# Patient Record
Sex: Female | Born: 1970 | Race: White | Hispanic: No | Marital: Married | Smoking: Former smoker
Health system: Southern US, Community
[De-identification: ages and names within clinical notes are randomized; demographics above are authoritative.]

## PROBLEM LIST (undated history)

## (undated) DIAGNOSIS — A499 Bacterial infection, unspecified: Secondary | ICD-10-CM

## (undated) DIAGNOSIS — I519 Heart disease, unspecified: Secondary | ICD-10-CM

## (undated) DIAGNOSIS — F17200 Nicotine dependence, unspecified, uncomplicated: Secondary | ICD-10-CM

## (undated) DIAGNOSIS — E785 Hyperlipidemia, unspecified: Secondary | ICD-10-CM

## (undated) DIAGNOSIS — F419 Anxiety disorder, unspecified: Secondary | ICD-10-CM

## (undated) DIAGNOSIS — R5383 Other fatigue: Secondary | ICD-10-CM

## (undated) DIAGNOSIS — K649 Unspecified hemorrhoids: Secondary | ICD-10-CM

## (undated) DIAGNOSIS — K219 Gastro-esophageal reflux disease without esophagitis: Secondary | ICD-10-CM

## (undated) DIAGNOSIS — F32A Depression, unspecified: Secondary | ICD-10-CM

## (undated) DIAGNOSIS — K921 Melena: Secondary | ICD-10-CM

## (undated) DIAGNOSIS — M546 Pain in thoracic spine: Secondary | ICD-10-CM

## (undated) DIAGNOSIS — N809 Endometriosis, unspecified: Secondary | ICD-10-CM

## (undated) DIAGNOSIS — K4090 Unilateral inguinal hernia, without obstruction or gangrene, not specified as recurrent: Secondary | ICD-10-CM

## (undated) DIAGNOSIS — R87619 Unspecified abnormal cytological findings in specimens from cervix uteri: Secondary | ICD-10-CM

## (undated) DIAGNOSIS — B009 Herpesviral infection, unspecified: Secondary | ICD-10-CM

## (undated) DIAGNOSIS — R42 Dizziness and giddiness: Secondary | ICD-10-CM

## (undated) DIAGNOSIS — N938 Other specified abnormal uterine and vaginal bleeding: Secondary | ICD-10-CM

## (undated) DIAGNOSIS — R002 Palpitations: Secondary | ICD-10-CM

## (undated) DIAGNOSIS — H9312 Tinnitus, left ear: Secondary | ICD-10-CM

## (undated) DIAGNOSIS — I219 Acute myocardial infarction, unspecified: Secondary | ICD-10-CM

## (undated) DIAGNOSIS — R252 Cramp and spasm: Secondary | ICD-10-CM

## (undated) DIAGNOSIS — D219 Benign neoplasm of connective and other soft tissue, unspecified: Secondary | ICD-10-CM

## (undated) DIAGNOSIS — Z86010 Personal history of colonic polyps: Secondary | ICD-10-CM

## (undated) DIAGNOSIS — I10 Essential (primary) hypertension: Secondary | ICD-10-CM

## (undated) DIAGNOSIS — Z87898 Personal history of other specified conditions: Secondary | ICD-10-CM

## (undated) DIAGNOSIS — L309 Dermatitis, unspecified: Secondary | ICD-10-CM

## (undated) DIAGNOSIS — N979 Female infertility, unspecified: Secondary | ICD-10-CM

## (undated) DIAGNOSIS — R0600 Dyspnea, unspecified: Secondary | ICD-10-CM

## (undated) DIAGNOSIS — R1013 Epigastric pain: Secondary | ICD-10-CM

## (undated) DIAGNOSIS — Z860101 Personal history of adenomatous and serrated colon polyps: Secondary | ICD-10-CM

## (undated) DIAGNOSIS — F329 Major depressive disorder, single episode, unspecified: Secondary | ICD-10-CM

## (undated) DIAGNOSIS — G2581 Restless legs syndrome: Secondary | ICD-10-CM

## (undated) DIAGNOSIS — E669 Obesity, unspecified: Secondary | ICD-10-CM

## (undated) DIAGNOSIS — D509 Iron deficiency anemia, unspecified: Secondary | ICD-10-CM

## (undated) HISTORY — DX: Benign neoplasm of connective and other soft tissue, unspecified: D21.9

## (undated) HISTORY — DX: Acute myocardial infarction, unspecified: I21.9

## (undated) HISTORY — DX: Other specified abnormal uterine and vaginal bleeding: N93.8

## (undated) HISTORY — PX: HERNIA REPAIR: SHX51

## (undated) HISTORY — DX: Obesity, unspecified: E66.9

## (undated) HISTORY — DX: Unspecified abnormal cytological findings in specimens from cervix uteri: R87.619

## (undated) HISTORY — DX: Depression, unspecified: F32.A

## (undated) HISTORY — PX: HEMORRHOID BANDING: SHX5850

## (undated) HISTORY — DX: Major depressive disorder, single episode, unspecified: F32.9

## (undated) HISTORY — PX: COLPOSCOPY: SHX161

## (undated) HISTORY — DX: Essential (primary) hypertension: I10

## (undated) HISTORY — DX: Bacterial infection, unspecified: A49.9

## (undated) HISTORY — PX: CERVICAL BIOPSY  W/ LOOP ELECTRODE EXCISION: SUR135

## (undated) HISTORY — PX: WISDOM TOOTH EXTRACTION: SHX21

## (undated) HISTORY — DX: Hyperlipidemia, unspecified: E78.5

## (undated) HISTORY — DX: Endometriosis, unspecified: N80.9

## (undated) HISTORY — PX: OTHER SURGICAL HISTORY: SHX169

## (undated) HISTORY — PX: GYNECOLOGIC CRYOSURGERY: SHX857

## (undated) HISTORY — DX: Palpitations: R00.2

## (undated) HISTORY — DX: Anxiety disorder, unspecified: F41.9

## (undated) HISTORY — PX: TUBAL LIGATION: SHX77

## (undated) HISTORY — DX: Dizziness and giddiness: R42

## (undated) HISTORY — DX: Female infertility, unspecified: N97.9

---

## 1991-10-27 HISTORY — PX: DILATION AND CURETTAGE OF UTERUS: SHX78

## 1993-10-26 HISTORY — PX: ABLATION ON ENDOMETRIOSIS: SHX5787

## 1998-02-01 ENCOUNTER — Inpatient Hospital Stay (HOSPITAL_COMMUNITY): Admission: AD | Admit: 1998-02-01 | Discharge: 1998-02-03 | Payer: Self-pay | Admitting: *Deleted

## 1998-02-15 ENCOUNTER — Encounter: Admission: RE | Admit: 1998-02-15 | Discharge: 1998-05-16 | Payer: Self-pay | Admitting: *Deleted

## 1998-10-26 HISTORY — PX: INTRAUTERINE DEVICE INSERTION: SHX323

## 1999-03-06 ENCOUNTER — Other Ambulatory Visit: Admission: RE | Admit: 1999-03-06 | Discharge: 1999-03-06 | Payer: Self-pay | Admitting: *Deleted

## 2000-04-02 ENCOUNTER — Other Ambulatory Visit: Admission: RE | Admit: 2000-04-02 | Discharge: 2000-04-02 | Payer: Self-pay | Admitting: *Deleted

## 2001-07-08 ENCOUNTER — Other Ambulatory Visit: Admission: RE | Admit: 2001-07-08 | Discharge: 2001-07-08 | Payer: Self-pay | Admitting: *Deleted

## 2001-12-15 ENCOUNTER — Other Ambulatory Visit: Admission: RE | Admit: 2001-12-15 | Discharge: 2001-12-15 | Payer: Self-pay | Admitting: *Deleted

## 2002-06-07 ENCOUNTER — Other Ambulatory Visit: Admission: RE | Admit: 2002-06-07 | Discharge: 2002-06-07 | Payer: Self-pay | Admitting: *Deleted

## 2003-07-17 ENCOUNTER — Other Ambulatory Visit: Admission: RE | Admit: 2003-07-17 | Discharge: 2003-07-17 | Payer: Self-pay | Admitting: *Deleted

## 2004-08-14 ENCOUNTER — Other Ambulatory Visit: Admission: RE | Admit: 2004-08-14 | Discharge: 2004-08-14 | Payer: Self-pay | Admitting: Gynecology

## 2005-08-24 ENCOUNTER — Other Ambulatory Visit: Admission: RE | Admit: 2005-08-24 | Discharge: 2005-08-24 | Payer: Self-pay | Admitting: Gynecology

## 2007-03-23 ENCOUNTER — Other Ambulatory Visit: Admission: RE | Admit: 2007-03-23 | Discharge: 2007-03-23 | Payer: Self-pay | Admitting: Gynecology

## 2008-03-26 ENCOUNTER — Other Ambulatory Visit: Admission: RE | Admit: 2008-03-26 | Discharge: 2008-03-26 | Payer: Self-pay | Admitting: Gynecology

## 2008-10-26 HISTORY — PX: IUD REMOVAL: SHX5392

## 2009-01-30 ENCOUNTER — Encounter: Admission: RE | Admit: 2009-01-30 | Discharge: 2009-01-30 | Payer: Self-pay | Admitting: Internal Medicine

## 2009-02-01 ENCOUNTER — Encounter: Admission: RE | Admit: 2009-02-01 | Discharge: 2009-02-01 | Payer: Self-pay | Admitting: Internal Medicine

## 2009-07-26 ENCOUNTER — Ambulatory Visit (HOSPITAL_COMMUNITY): Admission: RE | Admit: 2009-07-26 | Discharge: 2009-07-26 | Payer: Self-pay | Admitting: Obstetrics and Gynecology

## 2010-02-18 ENCOUNTER — Encounter: Admission: RE | Admit: 2010-02-18 | Discharge: 2010-02-18 | Payer: Self-pay | Admitting: Gynecology

## 2010-07-02 ENCOUNTER — Encounter: Admission: RE | Admit: 2010-07-02 | Discharge: 2010-07-02 | Payer: Self-pay | Admitting: Internal Medicine

## 2010-11-16 ENCOUNTER — Encounter: Payer: Self-pay | Admitting: Internal Medicine

## 2011-01-30 LAB — CBC
HCT: 41.7 % (ref 36.0–46.0)
Hemoglobin: 14.3 g/dL (ref 12.0–15.0)
MCHC: 34.2 g/dL (ref 30.0–36.0)
MCV: 92.5 fL (ref 78.0–100.0)
Platelets: 268 10*3/uL (ref 150–400)
RBC: 4.51 MIL/uL (ref 3.87–5.11)
RDW: 13.4 % (ref 11.5–15.5)
WBC: 6.4 10*3/uL (ref 4.0–10.5)

## 2011-01-30 LAB — HCG, SERUM, QUALITATIVE: Preg, Serum: NEGATIVE

## 2011-10-27 HISTORY — PX: INTRAUTERINE DEVICE (IUD) INSERTION: SHX5877

## 2013-09-12 ENCOUNTER — Telehealth: Payer: Self-pay | Admitting: Obstetrics and Gynecology

## 2013-09-12 NOTE — Telephone Encounter (Signed)
Patient may make an appointment as a new patient when I have an appropriate opening.  She can be seen and examined for a "problem visit - new patient."  This does not have to be at the time of an annual exam.  I will not be able to render an opinion for the patient without a formal visit and complete review of her records at that time.  Thanks.

## 2013-09-12 NOTE — Telephone Encounter (Signed)
Patient will be a new patient wants appt with silva. I told her she was booked until January. She was ok with that. She has had a lot of abnormal paps has to go every 3 months for a pap. She really just wants a second opinion about what is going on and really doesn't want to go back to current doctor. She already had her full annual this year. But says she wants to do a pap when she comes in to get the second opinion. i told her to have her current doc fax her records so silva would have them for her appt.

## 2013-09-12 NOTE — Telephone Encounter (Signed)
Spoke with pt about history of abnormal Paps going back about 7 years. Pt has history of 2 colposcopies and a LEEP-dated 06-14-12 for precancerous cells. Pt is weary of Q 3 month Paps and is wondering if there is anything she can do, even having surgery to remove the cervix, to end this cycle. Pt burdened financially with all of the procedures. Last Aex 11-30-12 with Dr. Margaretha Sheffield. Pt last seen in August for last Pap. Discussed scheduling Aex appt here with BS 12-01-13 to assure full insurance coverage. Advised pt I would route message to [facility] for possibility of consult visit beforehand to get her opinion. Pt agreeable. Any advice?

## 2013-09-13 NOTE — Telephone Encounter (Signed)
Message left to return call to [place] at [phone] on mobile.   Will give message from Dr. Edward Jolly.  Reiterate need for records.  Can schedule Problem OV, earliest available-Jan 2015.

## 2013-09-14 NOTE — Telephone Encounter (Signed)
Patient returned call. Message from Dr. Edward Jolly was given to patient. She is agreeable to Problem visit, new patient, prior to [facility] Annual Exam but does not want to have a pap done in January when she can wait until February to have annual exam and have it be covered by insurance. I advised patient that she does not have to have a pap done in January, it would be a consult appointment with Dr. Edward Jolly.  She feels that waiting one more month will not make a difference, I advised patient that was her choice, but that Dr. Edward Jolly would need extra time to review records and would need more time than an annual exam would provide so that she could make an opinion. Patient was agreeable. She states that she will collect records, come in for annual exam, have pap at that time and if required would schedule consult to do discuss pap smears and further treatment options, as patient states "I know it will be abnormal, I just want this cycle to end". Again stressed importance of timely records collection and need to have pap smears done at intervals suggested by her prior physician until she is seen by Dr. Edward Jolly.   Patient is agreeable to AEX date and will forward records prior.   Routing to provider for final review. Patient agreeable to disposition. Will close encounter

## 2013-09-14 NOTE — Telephone Encounter (Signed)
LMTCB for appt in January.  aa

## 2013-12-01 ENCOUNTER — Encounter: Payer: Self-pay | Admitting: Obstetrics and Gynecology

## 2013-12-01 ENCOUNTER — Ambulatory Visit (INDEPENDENT_AMBULATORY_CARE_PROVIDER_SITE_OTHER): Payer: 59 | Admitting: Obstetrics and Gynecology

## 2013-12-01 VITALS — BP 133/86 | HR 81 | Resp 18 | Ht 67.5 in | Wt 209.0 lb

## 2013-12-01 DIAGNOSIS — Z0000 Encounter for general adult medical examination without abnormal findings: Secondary | ICD-10-CM

## 2013-12-01 DIAGNOSIS — Z01419 Encounter for gynecological examination (general) (routine) without abnormal findings: Secondary | ICD-10-CM

## 2013-12-01 LAB — POCT URINALYSIS DIPSTICK
Bilirubin, UA: NEGATIVE
Blood, UA: NEGATIVE
Glucose, UA: NEGATIVE
Ketones, UA: NEGATIVE
Leukocytes, UA: NEGATIVE
Nitrite, UA: NEGATIVE
Protein, UA: NEGATIVE
Urobilinogen, UA: NEGATIVE
pH, UA: 5

## 2013-12-01 MED ORDER — NONFORMULARY OR COMPOUNDED ITEM: Status: DC

## 2013-12-01 NOTE — Progress Notes (Signed)
GYNECOLOGY VISIT  PCP: Jilda Panda, MD  Referring provider:   HPI: 43 y.o.  Divorced  Caucasian  female   [id number] with Patient's last menstrual period was 11/29/2012.   here for   Annual Exam History of abnormal paps and cryo and LEEP for the last 5 years.  Mirena IUD placed late 2013 or early 2014.  Placed for heavy menstruation.  Had saline ultrasound - no fibroids. Cycles are now very light.   Hgb:  PCP Urine:  neg  GYNECOLOGIC HISTORY: Patient's last menstrual period was 11/29/2012. Sexually active:  yes Partner preference: female Contraception:   Mirena IUD, BTL Menopausal hormone therapy: no DES exposure:   no Blood transfusions:   No  Sexually transmitted diseases: ? HPV   GYN Procedures:  LEEP- late 2012 or early 2013, Colpo/ Bx,  Cryo Mammogram:    11/2012 normal               Pap:   03/2013 or September 2014 - Abnormal  History of abnormal pap smear:  Yes      Abnormal pap from 2010-2014  Has recurrent bacterial vaginosis. Frustrated by this. Takes Flagyl often.   Has hemorrhoids.  Has blood in stool randomly.  No constipation.  Some loose stools.     OB History   Grav Para Term Preterm Abortions TAB SAB Ect Mult Living   3 2   1  1   2        LIFESTYLE: Exercise:      no         Tobacco:  Yes   10-20 cigarettes a day Alcohol:  2-3 drinks a week   (Red Bull and Vodka) Drug use: no   OTHER HEALTH MAINTENANCE: Tetanus/TDap: not sure  Gardisil: no Influenza:  no Zostavax: no  Bone density: no Colonoscopy: yes   2010 (polyps benign)   2013 (polyps  Precancerous) - Dr. Earlean Shawl.   Cholesterol check: not sure  Family History  Problem Relation Age of Onset  . Cancer Mother   . Heart disease Father   . Heart failure Paternal Aunt     Stage 4 colon cancer  . Heart disease Paternal Grandfather     There are no active problems to display for this patient.  Past Medical History  Diagnosis Date  . Anxiety   . Depression   . Endometriosis   .  Infertility, female   . Abnormal Pap smear of cervix     2010-2014 abnormal pap every 3-6 months  ? Dysplasia  . Recurrent bacterial infection   . DUB (dysfunctional uterine bleeding)     Past Surgical History  Procedure Laterality Date  . Colposcopy      x 3  . Dilation and curettage of uterus  1993  . Gynecologic cryosurgery    . Cervical biopsy  w/ loop electrode excision    . Ablation on endometriosis  1995  . Tubal ligation      2010  . Intrauterine device (iud) insertion  2014     (Mirena)    ALLERGIES: Review of patient's allergies indicates no known allergies.  Current Outpatient Prescriptions  Medication Sig Dispense Refill  . Ibuprofen (ADVIL) 200 MG CAPS Take by mouth as needed.      Marland Kitchen levonorgestrel (MIRENA) 20 MCG/24HR IUD 1 each by Intrauterine route once.      Marland Kitchen omeprazole (PRILOSEC) 40 MG capsule Take 40 mg by mouth daily.       No current facility-administered medications  for this visit.     ROS:  Pertinent items are noted in HPI.  SOCIAL HISTORY:  Divorced.  Government social research officer.   PHYSICAL EXAMINATION:    BP 133/86  Pulse 81  Resp 18  Ht 5' 7.5" (1.715 m)  Wt 209 lb (94.802 kg)  BMI 32.23 kg/m2  LMP 11/29/2012   Wt Readings from Last 3 Encounters:  12/01/13 209 lb (94.802 kg)     Ht Readings from Last 3 Encounters:  12/01/13 5' 7.5" (1.715 m)    General appearance: alert, cooperative and appears stated age Head: Normocephalic, without obvious abnormality, atraumatic Neck: no adenopathy, supple, symmetrical, trachea midline and thyroid not enlarged, symmetric, no tenderness/mass/nodules Lungs: clear to auscultation bilaterally Breasts: Inspection negative, No nipple retraction or dimpling, No nipple discharge or bleeding, No axillary or supraclavicular adenopathy, Normal to palpation without dominant masses Heart: regular rate and rhythm Abdomen: soft, non-tender; no masses,  no organomegaly Extremities: extremities normal, atraumatic, no  cyanosis or edema Skin: Skin color, texture, turgor normal. No rashes or lesions Lymph nodes: Cervical, supraclavicular, and axillary nodes normal. No abnormal inguinal nodes palpated Neurologic: Grossly normal  Pelvic: External genitalia:  no lesions              Urethra:  normal appearing urethra with no masses, tenderness or lesions              Bartholins and Skenes: normal                 Vagina: normal appearing vagina with normal color and discharge, no lesions              Cervix: normal appearance, IUd strings seen.               Pap and high risk HPV testing done: yes.            Bimanual Exam:  Uterus:  uterus is normal size, shape, consistency and nontender                                      Adnexa: normal adnexa in size, nontender and no masses                                      Rectovaginal: Confirms                                      Anus:  normal sphincter tone, no lesions  ASSESSMENT  Normal gynecologic exam. Mirena IUD patient. History of abnormal paps and LEEP History of recurrent BV. History of hemorrhoids and random blood in stool.  PLAN  Mammogram yearly.  Pap smear and high risk HPV testing performed today.  Will get records from prior paps, colpos, LEEP. Rx for boric acid suppositories.  Medications per Epic orders. Patient to follow up with her GI. Declines TDap today.   Return annually or prn   An After Visit Summary was printed and given to the patient.

## 2013-12-05 LAB — IPS PAP TEST WITH HPV

## 2013-12-08 ENCOUNTER — Telehealth: Payer: Self-pay | Admitting: *Deleted

## 2013-12-08 NOTE — Telephone Encounter (Signed)
Message copied by Jaymes Graff on Fri Dec 08, 2013  6:36 PM ------      Message from: W[place]ington, BROOK E      Created: Wed Dec 06, 2013 12:33 PM       Please inform patient of pap showing ASCUS and negative high risk HPV.      This is a great result!  Tell her everything is OK.      There are minor changes in the cells of undetermined significance.      Due to the negative high risk HPV status, the ultimate interpretation of this pap is "normal" by current standards of care.      She has been followed by her prior GYN following a LEEP and was having frequent paps and colposcopies.      I recommend her next pap in one year.      Please place in recall - 02. ------

## 2013-12-19 ENCOUNTER — Ambulatory Visit
Admission: RE | Admit: 2013-12-19 | Discharge: 2013-12-19 | Disposition: A | Discharge status: Home / Self Care | Payer: 59 | Source: Ambulatory Visit | Attending: Obstetrics and Gynecology | Admitting: Obstetrics and Gynecology

## 2013-12-19 DIAGNOSIS — Z01419 Encounter for gynecological examination (general) (routine) without abnormal findings: Secondary | ICD-10-CM

## 2014-01-16 NOTE — Telephone Encounter (Signed)
See lab result note, patient was notified on 12-08-13. Encounter closed.

## 2014-07-09 ENCOUNTER — Encounter: Payer: Self-pay | Admitting: Obstetrics and Gynecology

## 2014-07-10 ENCOUNTER — Encounter: Payer: Self-pay | Admitting: Obstetrics and Gynecology

## 2014-08-27 ENCOUNTER — Encounter: Payer: Self-pay | Admitting: Obstetrics and Gynecology

## 2014-12-05 ENCOUNTER — Ambulatory Visit: Payer: 59 | Admitting: Obstetrics and Gynecology

## 2014-12-05 ENCOUNTER — Ambulatory Visit (INDEPENDENT_AMBULATORY_CARE_PROVIDER_SITE_OTHER): Payer: 59 | Admitting: Obstetrics and Gynecology

## 2014-12-05 ENCOUNTER — Encounter: Payer: Self-pay | Admitting: Obstetrics and Gynecology

## 2014-12-05 VITALS — BP 130/80 | HR 82 | Resp 16 | Ht 67.5 in | Wt 217.8 lb

## 2014-12-05 DIAGNOSIS — Z01419 Encounter for gynecological examination (general) (routine) without abnormal findings: Secondary | ICD-10-CM

## 2014-12-05 DIAGNOSIS — Z0000 Encounter for general adult medical examination without abnormal findings: Secondary | ICD-10-CM

## 2014-12-05 DIAGNOSIS — R319 Hematuria, unspecified: Secondary | ICD-10-CM

## 2014-12-05 LAB — POCT URINALYSIS DIPSTICK
Leukocytes, UA: NEGATIVE
Urobilinogen, UA: NEGATIVE
pH, UA: 5

## 2014-12-05 NOTE — Patient Instructions (Signed)

## 2014-12-05 NOTE — Progress Notes (Signed)
44 y.o. [id number] Legally SeparatedCaucasianF here for annual exam.    History of abnormal paps and cryo and LEEP for the last 5 years.  Mirena IUD placed late 2013 or early 2014. Placed for heavy menstruation. Had saline ultrasound - no fibroids. Cycles are spotting only. Occurs not every month.   Uses periodic boric acid suppositories treatment of bacterial vaginosis.   Works for [employer].  Have a wellness program.   Mother passed from colon CA.  Has hemorrhoids.  Still has some rectal bleeding.  Epigastric pains.  Has had colonoscopy and endoscopy.  Has hemorrhoid banding.   No LMP recorded. Patient is not currently having periods (Reason: IUD).          Sexually active: Yes.    The current method of family planning is tubal ligation and Mirena IUD.    Exercising: No.  The patient does not participate in regular exercise at present. Smoker:  Yes 1/2 pack qd  Health Maintenance: Pap:  12/01/13 ASCUS HR HPV Negative History of abnormal Pap:  Yes has had hx of LEEP late 2012 or early 2013; Colpo BX and Cryo. 03/09/12 ASCUS Positive  HPV ; 12/02/12 ASCUS no hpv done ; 06/07/13 ASCUS NEG HR HPV MMG:  12/19/13 Breast Density Category B: Bi-Rads 1-Negative Colonoscopy:  2014- Polyps f/u in 5 years  BMD:  Never had one  TDaP: 2015 Screening Labs: Done by Jilda Panda, MD ; Urine today: RBC'S +   reports that she has been smoking Cigarettes.  She has been smoking about 1.00 pack per day. She has never used smokeless tobacco. She reports that she drinks about 1.5 - 2.0 oz of alcohol per week. She reports that she does not use illicit drugs.  Past Medical History  Diagnosis Date  . Anxiety   . Depression   . Endometriosis   . Infertility, female   . Abnormal Pap smear of cervix     2010-2014 abnormal pap every 3-6 months  ? Dysplasia  . Recurrent bacterial infection   . DUB (dysfunctional uterine bleeding)     Past Surgical History  Procedure Laterality Date  . Colposcopy       x 3  . Dilation and curettage of uterus  1993  . Gynecologic cryosurgery    . Cervical biopsy  w/ loop electrode excision    . Ablation on endometriosis  1995  . Tubal ligation      2010  . Intrauterine device (iud) insertion  2014     (Mirena)    Current Outpatient Prescriptions  Medication Sig Dispense Refill  . Ibuprofen (ADVIL) 200 MG CAPS Take by mouth as needed.    Marland Kitchen levonorgestrel (MIRENA) 20 MCG/24HR IUD 1 each by Intrauterine route once.    Marland Kitchen omeprazole (PRILOSEC) 40 MG capsule Take 40 mg by mouth daily.    . NONFORMULARY OR COMPOUNDED ITEM Boric acid suppository 200 mg, size O gelatin capsule.  One pv twice weekly.  With flare, use daily pv for 3 days. 30 each 0   No current facility-administered medications for this visit.    Family History  Problem Relation Age of Onset  . Cancer Mother   . Heart disease Father   . Heart failure Paternal Aunt     Stage 4 colon cancer  . Heart disease Paternal Grandfather     ROS:  Pertinent items are noted in HPI.  Otherwise, a comprehensive ROS was negative.  Exam:   BP 130/80 mmHg  Pulse 82  Resp 16  Ht 5' 7.5" (1.715 m)  Wt 217 lb 12.8 oz (98.793 kg)  BMI 33.59 kg/m2    Height: 5' 7.5" (171.5 cm)  Ht Readings from Last 3 Encounters:  12/05/14 5' 7.5" (1.715 m)  12/01/13 5' 7.5" (1.715 m)    General appearance: alert, cooperative and appears stated age Head: Normocephalic, without obvious abnormality, atraumatic Neck: no adenopathy, supple, symmetrical, trachea midline and thyroid normal to inspection and palpation Lungs: clear to auscultation bilaterally Breasts: normal appearance, no masses or tenderness, Inspection negative, No nipple retraction or dimpling, No nipple discharge or bleeding, No axillary or supraclavicular adenopathy, Normal to palpation without dominant masses Heart: regular rate and rhythm Abdomen: soft, non-tender; bowel sounds normal; no masses,  no organomegaly Extremities: extremities  normal, atraumatic, no cyanosis or edema Skin: Skin color, texture, turgor normal. No rashes or lesions Lymph nodes: Cervical, supraclavicular, and axillary nodes normal. No abnormal inguinal nodes palpated Neurologic: Grossly normal   Pelvic: External genitalia:  no lesions              Urethra:  normal appearing urethra with no masses, tenderness or lesions              Bartholins and Skenes: normal                 Vagina: normal appearing vagina with normal color and discharge, no lesions              Cervix: no lesions.  IUD strings seen.               Pap taken: Yes.   Bimanual Exam:  Uterus:  normal size, contour, position, consistency, mobility, non-tender              Adnexa: normal adnexa and no mass, fullness, tenderness               Rectovaginal: Confirms               Anus:  normal sphincter tone, no lesions  Chaperone was present for exam.  A:  Well Woman with normal exam Microscopic hematuria.  History of abnormal paps.  History of LEEP.  Mirena IUD patient.  Status post BTL.  Smoker.  Overweight.  Rectal bleeding and epigastric pain.  Family history of colon cancer.   P:   mammogram pap smear and HR HPV.  Urine micro and culture.  Discussed weight loss through diet and exercise.  Discussed smoking cessation.  Patient will follow up with Dr. Earlean Shawl, her GI, regarding her rectal bleeding and epigastric pain.  return annually or prn

## 2014-12-06 LAB — URINALYSIS, MICROSCOPIC ONLY
Bacteria, UA: NONE SEEN
Casts: NONE SEEN
Crystals: NONE SEEN
Squamous Epithelial / LPF: NONE SEEN

## 2014-12-07 LAB — IPS PAP TEST WITH HPV

## 2014-12-07 LAB — URINE CULTURE
Colony Count: NO GROWTH
Organism ID, Bacteria: NO GROWTH

## 2015-12-11 ENCOUNTER — Other Ambulatory Visit: Payer: Self-pay

## 2015-12-11 ENCOUNTER — Ambulatory Visit (INDEPENDENT_AMBULATORY_CARE_PROVIDER_SITE_OTHER): Payer: 59 | Admitting: Obstetrics and Gynecology

## 2015-12-11 ENCOUNTER — Encounter: Payer: Self-pay | Admitting: Obstetrics and Gynecology

## 2015-12-11 VITALS — BP 118/74 | HR 80 | Resp 21 | Ht 67.25 in | Wt 221.0 lb

## 2015-12-11 DIAGNOSIS — Z1231 Encounter for screening mammogram for malignant neoplasm of breast: Secondary | ICD-10-CM

## 2015-12-11 DIAGNOSIS — Z01419 Encounter for gynecological examination (general) (routine) without abnormal findings: Secondary | ICD-10-CM | POA: Diagnosis not present

## 2015-12-11 MED ORDER — NONFORMULARY OR COMPOUNDED ITEM: Status: DC

## 2015-12-11 NOTE — Progress Notes (Signed)
Patient ID: Andrea [name], female   DOB: July 05, 1971, 45 y.o.   MRN: [id number] 45 y.o. [id number] Legally Separated Caucasian female here for annual exam.    Has occasional heavy bleeding with cycles with Mirena.  Random hot flashes.   Going to the gym and doing low carb diet.  Doing weights and cardio.  Lost 15 pounds.  Quitting smoking.   Has rectal bleeding.  Sees Dr. Earlean Shawl.  Last visit was last summer and had banding of hemorrhoids. FH of colon cancer.   Needs a refill on her boric acid.  Feels like this is working.   Newly engaged.   PCP:  Jilda Panda, MD  No LMP recorded. Patient is not currently having periods (Reason: IUD).          Sexually active: Yes.  female  The current method of family planning is tubal ligation/Mirena inserted 09/2012.    Exercising: Yes.    weights and cardio 4-5x/week. Smoker:  Yes, smokes 1/2 to 3/4 pack/week  Health Maintenance: Pap:  12-05-14 Neg:Neg HR HPV History of abnormal Pap:  Yes, 10/2011 hx Colpo/LEEP procedure;between 2010 and 2013 colpo/cryotherapy to cervix MMG:  12-19-13 Density Cat.B/Neg/BiRads1:[facility] Colonoscopy:  2014 polyp with Dr. Brita Romp due 2019(fam.hx colon ca in mom--deceased). BMD:   n/a  Result  n/a TDaP:  2015 Screening Labs:  Hb today: PCP, Urine today: negative other than ketones.    reports that she has been smoking Cigarettes.  She has been smoking about 0.50 packs per day. She has never used smokeless tobacco. She reports that she drinks about 0.6 - 1.2 oz of alcohol per week. She reports that she does not use illicit drugs.  Past Medical History  Diagnosis Date  . Anxiety   . Depression   . Endometriosis   . Infertility, female   . Abnormal Pap smear of cervix     2010-2014 abnormal pap every 3-6 months  ? Dysplasia  . Recurrent bacterial infection   . DUB (dysfunctional uterine bleeding)     Past Surgical History  Procedure Laterality Date  . Colposcopy      x 3  . Dilation and curettage of  uterus  1993  . Gynecologic cryosurgery    . Cervical biopsy  w/ loop electrode excision    . Ablation on endometriosis  1995  . Tubal ligation      2010  . Intrauterine device (iud) insertion  2014     (Mirena)    Current Outpatient Prescriptions  Medication Sig Dispense Refill  . Ibuprofen (ADVIL) 200 MG CAPS Take by mouth as needed.    Marland Kitchen levonorgestrel (MIRENA) 20 MCG/24HR IUD 1 each by Intrauterine route once.    . NONFORMULARY OR COMPOUNDED ITEM Boric acid suppository 200 mg, size O gelatin capsule.  One pv twice weekly.  With flare, use daily pv for 3 days. 30 each 0  . omeprazole (PRILOSEC) 40 MG capsule Take 40 mg by mouth as needed.      No current facility-administered medications for this visit.    Family History  Problem Relation Age of Onset  . Cancer Mother 108    Dec.Stage IV colon ca 10/2013--age 107  . Heart disease Father   . Heart failure Paternal Aunt     Stage 4 colon cancer  . Heart disease Paternal Grandfather   . Other Paternal Grandfather     Dec MVA  . Other Brother     Dec in a MVA age 26  .  Alzheimer's disease Maternal Grandfather     Dec  . Other Maternal Grandfather   . Other Paternal Grandmother     Dec MVA    ROS:  Pertinent items are noted in HPI.  Otherwise, a comprehensive ROS was negative.  Exam:   BP 118/74 mmHg  Pulse 80  Resp 21  Ht 5' 7.25" (1.708 m)  Wt 221 lb (100.245 kg)  BMI 34.36 kg/m2  LMP     General appearance: alert, cooperative and appears stated age Head: Normocephalic, without obvious abnormality, atraumatic Neck: no adenopathy, supple, symmetrical, trachea midline and thyroid normal to inspection and palpation Lungs: clear to auscultation bilaterally Breasts: normal appearance, no masses or tenderness, Inspection negative, No nipple retraction or dimpling, No nipple discharge or bleeding, No axillary or supraclavicular adenopathy Heart: regular rate and rhythm Abdomen: soft, non-tender; bowel sounds normal; no  masses,  no organomegaly Extremities: extremities normal, atraumatic, no cyanosis or edema Skin: Skin color, texture, turgor normal. No rashes or lesions Lymph nodes: Cervical, supraclavicular, and axillary nodes normal. No abnormal inguinal nodes palpated Neurologic: Grossly normal  Pelvic: External genitalia:  no lesions              Urethra:  normal appearing urethra with no masses, tenderness or lesions              Bartholins and Skenes: normal                 Vagina: normal appearing vagina with normal color and discharge, no lesions              Cervix: no lesions.  IUD strings palpable but not visible.              Pap taken: Yes.   Bimanual Exam:  Uterus:  normal size, contour, position, consistency, mobility, non-tender              Adnexa: normal adnexa and no mass, fullness, tenderness              Rectovaginal: Yes.  .  Confirms.              Anus:  normal sphincter tone, no lesions  Chaperone was present for exam.  Assessment:   Well woman visit with normal exam. Status post BTL.  Mirena IUD patient.  Hx LEEP and cryo to cervix. Rectal bleeding.  FH of colon cancer.  Smoker.  Recurrent BV.   Plan: Yearly mammogram recommended after age 69.  Recommended self breast exam.  Pap and HR HPV as above. Discussed Calcium, Vitamin D, regular exercise program including cardiovascular and weight bearing exercise. Labs performed.  No..   See orders. Refills given on medications.  Yes.  .  See orders.  Refill of boric acid suppositories.   Patient will follow up with Dr. Earlean Shawl.  She will call his office.  Plan to switch out IUD next year.  Follow up annually and prn.      After visit summary provided.

## 2015-12-13 LAB — IPS PAP TEST WITH HPV

## 2016-01-01 ENCOUNTER — Ambulatory Visit
Admission: RE | Admit: 2016-01-01 | Discharge: 2016-01-01 | Disposition: A | Discharge status: Home / Self Care | Payer: 59 | Source: Ambulatory Visit

## 2016-01-01 DIAGNOSIS — Z1231 Encounter for screening mammogram for malignant neoplasm of breast: Secondary | ICD-10-CM

## 2016-10-05 ENCOUNTER — Ambulatory Visit (INDEPENDENT_AMBULATORY_CARE_PROVIDER_SITE_OTHER): Payer: 59 | Admitting: Orthopedic Surgery

## 2016-10-05 ENCOUNTER — Ambulatory Visit (INDEPENDENT_AMBULATORY_CARE_PROVIDER_SITE_OTHER): Payer: 59

## 2016-10-05 ENCOUNTER — Encounter (INDEPENDENT_AMBULATORY_CARE_PROVIDER_SITE_OTHER): Payer: Self-pay | Admitting: Orthopedic Surgery

## 2016-10-05 DIAGNOSIS — M25512 Pain in left shoulder: Secondary | ICD-10-CM | POA: Diagnosis not present

## 2016-10-05 DIAGNOSIS — M542 Cervicalgia: Secondary | ICD-10-CM

## 2016-10-05 DIAGNOSIS — G8929 Other chronic pain: Secondary | ICD-10-CM

## 2016-10-05 NOTE — Progress Notes (Signed)
Office Visit Note   Patient: Andrea [name]           Date of Birth: 09-24-1971           MRN: [id number] Visit Date: 10/05/2016 Requested by: Jilda Panda, MD [id number] [place], [place] [address] PCP: Jilda Panda, MD  Subjective: Chief Complaint  Patient presents with  . 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  She's taken with past 2 months off from working out and it feels a little bit better [place] classically she describes the pain as being very severe involving her whole arm at night when she is in the fetal position sleeping.  He does also hurt her when she does dumbbell fly-type exercises in the gym.  She denies any numbness and tingling in the arm.  She is right-hand dominant.  She describes extension activities which hurt her.  She's change her mattress many times in her pillow many times in order to help her neck.  She's been taking ibuprofen which helps.  She denies any right-sided radicular type symptoms.              Review of Systems All systems reviewed are negative as they relate to the chief complaint within the history of present illness.  Patient denies  fevers or chills.    Assessment & Plan: Visit Diagnoses:  1. Chronic left shoulder pain   2. Neck pain     Plan: Impression is that this problem is a difficult clinical distinction between intrinsic shoulder pathology versus radiculopathy from the neck.  She really doesn't have any mechanical symptoms in the shoulder.  Localizes her shoulder pain primarily anteriorly in the biceps region along with some posterior shoulder pain.  She has slight external rotation weakness on the left compared to the right.  However by history she also describes symptoms which radiate down the entire arm and also affect her shoulder blade at times.  She states that she has had a "crick" in her neck for many months.  At this time and  symptoms of been going on for over 6 months she has tried anti-inflammatories as well as an exercise program.  She was to continue to work out in order to maintain her health.  She does have loss of lordosis on cervical spine x-rays and normal shoulder x-rays.  Plan is for MRI shoulder and neck.  I think in the shoulder slap tear is less likely but she may have enough of a labral tear that she's getting a spinal glenoid notch cyst.  Alternatively and I think more likely is that this is referred pain from the neck.  I'll see her back after these 2 studies  Follow-Up Instructions: Return for after MRI.   Orders:  Orders Placed This Encounter  Procedures  . XR Shoulder Left  . XR Cervical Spine 2 or 3 views   No orders of the defined types were placed in this encounter.     Procedures: No procedures performed   Clinical Data: No additional findings.  Objective: Vital Signs: There were no vitals taken for this visit.  Physical Exam   Constitutional: Patient appears well-developed HEENT:  Head: Normocephalic Eyes:EOM are normal Neck: Normal range of motion Cardiovascular: Normal rate Pulmonary/chest: Effort normal Neurologic: Patient is alert Skin: Skin is warm Psychiatric: Patient has normal mood and affect    Ortho Exam examination  of her neck demonstrates full flexion and extension and rotation.  Patient has 5 out of 5 grip EPL FPL interosseous wrist flexion and wrist extension slightly weaker biceps on the left compared to the right slightly weaker external rotation on the left compared to the right no definite paresthesias C5-T1 bilaterally.  Reflexes symmetric bilateral biceps triceps both arms.  No before meals joint tenderness to direct palpation on the left negative O'Brien's testing no course grinding or crepitus with passive range of motion of the shoulder on the left.  No other masses lymph adenopathy or skin changes noted in the shoulder region.  Radial pulses intact  and symmetric bilaterally  Specialty Comments:  No specialty comments available.  Imaging: Xr Cervical Spine 2 Or 3 Views  Result Date: 10/05/2016 2 views cervical spine show loss of normal cervical lordosis but no other degenerative changes present.  Disc spaces maintained.  No facet arthritis is present.  Sternoclavicular joints normal  Xr Shoulder Left  Result Date: 10/05/2016 AP outlet and axillary view of the left shoulder reviewed.  Glenohumeral joint is symmetric and reduced.  No abnormal calcifications present within the bony shoulder girdle.  Lung fields visualized are clear.  Type II acromion present.  Acromiohumeral distance intact.  No acromioclavicular joint arthritis    PMFS History: There are no active problems to display for this patient.  Past Medical History:  Diagnosis Date  . Abnormal Pap smear of cervix    2010-2014 abnormal pap every 3-6 months  ? Dysplasia  . Anxiety   . Depression   . DUB (dysfunctional uterine bleeding)   . Endometriosis   . Infertility, female   . Recurrent bacterial infection     Family History  Problem Relation Age of Onset  . Cancer Mother 11    Dec.Stage IV colon ca 10/2013--age 49  . Heart disease Father   . Heart failure Paternal Aunt     Stage 4 colon cancer  . Heart disease Paternal Grandfather   . Other Paternal Grandfather     Dec MVA  . Other Brother     Dec in a MVA age 16  . Alzheimer's disease Maternal Grandfather     Dec  . Other Maternal Grandfather   . Other Paternal Grandmother     Dec MVA    Past Surgical History:  Procedure Laterality Date  . ABLATION ON ENDOMETRIOSIS  1995  . CERVICAL BIOPSY  W/ LOOP ELECTRODE EXCISION    . COLPOSCOPY     x 3  . DILATION AND CURETTAGE OF UTERUS  1993  . GYNECOLOGIC CRYOSURGERY    . INTRAUTERINE DEVICE (IUD) INSERTION  2014    (Mirena)  . TUBAL LIGATION     2010   Social History   Occupational History  . Not on file.   Social History Main Topics  .  Smoking status: Current Every Day Smoker    Packs/day: 0.50    Types: Cigarettes  . Smokeless tobacco: Never Used     Comment: currently smoking 1/2 pack qd to 3/4 pack/day  . Alcohol use 0.6 - 1.2 oz/week    1 - 2 Standard drinks or equivalent per week     Comment: 1-2 drinks a week (alcohol)  Red Bull and Vodka or red wine  . Drug use: No  . Sexual activity: Yes    Partners: Male    Birth control/ protection: IUD, Surgical     Comment: BTL/ Mirena IUD inserted 09/2012

## 2016-10-26 DIAGNOSIS — R42 Dizziness and giddiness: Secondary | ICD-10-CM

## 2016-10-26 HISTORY — PX: IUD REMOVAL: SHX5392

## 2016-10-26 HISTORY — DX: Dizziness and giddiness: R42

## 2016-11-04 ENCOUNTER — Ambulatory Visit (INDEPENDENT_AMBULATORY_CARE_PROVIDER_SITE_OTHER): Payer: 59 | Admitting: Orthopedic Surgery

## 2016-12-14 NOTE — Progress Notes (Signed)
46 y.o. G12P0012  Married Caucasian female here for annual exam.    Smoking again.  Nicotine patches work.  Working out helped as well.  Menses(spotting, brown clotting) is spacing out to every 45 days to every 60 days.  LMP - 11/26/16.  Has rectal bleeding.  Has had banding of hemorrhoids. Last time of having rectal bleeding was this morning.  She is in contact with Dr. Earlean Shawl about this. FH of colon cancer in mother and maternal aunt.  Has some tenderness under the skin of right breast, no regularity to this.  It is aggrevating to her.  Biometric screening at work, [employer].  PCP:   Jilda Panda, MD  No LMP recorded. Patient is not currently having periods (Reason: IUD).     Period Cycle (Days):  (only occ. spotting with Mirena IUD)     Sexually active: Yes.   female The current method of family planning is tubal ligation/Mirena IUD inserted 09/2012.    Exercising: Yes.    Weights, cardio and high intensity training Smoker:  Yes, smokes 1 pack/week  Health Maintenance: Pap:  12-11-15 Neg:Neg HR HPV; 12-05-14 Neg:Neg HR HPV History of abnormal Pap:  Yes, 10/2011 hx Colpo/LEEP procedure;between 2010 and 2013 colpo/cryotherapy to cervix MMG:  01-01-16 Density B/Neg/BiRads1:TBC.    Colonoscopy: 08/2016 polyps with Dr.Medoff;next due 08/2021(fam.hx colon ca in mom--deceased).   BMD:   n/a  Result  n/a TDaP:  2015 Gardasil:   N/A HIV: neg in pregnancy.  Hep C:  NA Screening Labs:  Hb today: PCP, Urine today: not done   reports that she has been smoking Cigarettes.  She has never used smokeless tobacco. She reports that she drinks alcohol. She reports that she does not use drugs.  Past Medical History:  Diagnosis Date  . Abnormal Pap smear of cervix    2010-2014 abnormal pap every 3-6 months  ? Dysplasia  . Anxiety   . Depression   . DUB (dysfunctional uterine bleeding)   . Endometriosis   . Infertility, female   . Recurrent bacterial infection     Past Surgical  History:  Procedure Laterality Date  . ABLATION ON ENDOMETRIOSIS  1995  . CERVICAL BIOPSY  W/ LOOP ELECTRODE EXCISION    . COLPOSCOPY     x 3  . DILATION AND CURETTAGE OF UTERUS  1993  . GYNECOLOGIC CRYOSURGERY    . INTRAUTERINE DEVICE (IUD) INSERTION  2014    (Mirena)  . TUBAL LIGATION     2010    Current Outpatient Prescriptions  Medication Sig Dispense Refill  . Ibuprofen (ADVIL) 200 MG CAPS Take by mouth as needed.    Marland Kitchen levonorgestrel (MIRENA) 20 MCG/24HR IUD 1 each by Intrauterine route once.    . NONFORMULARY OR COMPOUNDED ITEM Boric acid suppository 200 mg, size O gelatin capsule.  One pv twice weekly.  With flare, use daily pv for 3 days. 30 each 0  . omeprazole (PRILOSEC) 40 MG capsule Take 40 mg by mouth as needed.     . phentermine 37.5 MG capsule Take 37.5 mg by mouth every morning.     No current facility-administered medications for this visit.     Family History  Problem Relation Age of Onset  . Cancer Mother 96    Dec.Stage IV colon ca 10/2013--age 35  . Heart disease Father   . Heart disease Paternal Grandfather   . Other Paternal Grandfather     Dec MVA  . Other Brother  Dec in a MVA age 40  . Alzheimer's disease Maternal Grandfather     Dec  . Other Maternal Grandfather   . Other Paternal Grandmother     Dec MVA  . Heart failure Paternal Aunt     Stage 4 colon cancer    ROS:  Pertinent items are noted in HPI.  Otherwise, a comprehensive ROS was negative.  Exam:   BP (!) 142/80 (BP Location: Right Arm, Patient Position: Sitting, Cuff Size: Normal)   Pulse 84   Resp 18   Ht 5\' 7"  (1.702 m)   Wt 200 lb 12.8 oz (91.1 kg)   BMI 31.45 kg/m     General appearance: alert, cooperative and appears stated age Head: Normocephalic, without obvious abnormality, atraumatic Neck: no adenopathy, supple, symmetrical, trachea midline and thyroid normal to inspection and palpation Lungs: clear to auscultation bilaterally Breasts: normal appearance, no masses  or tenderness, No nipple retraction or dimpling, No nipple discharge or bleeding, No axillary or supraclavicular adenopathy Heart: regular rate and rhythm Abdomen: soft, non-tender; no masses, no organomegaly Extremities: extremities normal, atraumatic, no cyanosis or edema Skin: Skin color, texture, turgor normal. No rashes or lesions Lymph nodes: Cervical, supraclavicular, and axillary nodes normal. No abnormal inguinal nodes palpated Neurologic: Grossly normal  Pelvic: External genitalia:  no lesions              Urethra:  normal appearing urethra with no masses, tenderness or lesions              Bartholins and Skenes: normal                 Vagina: normal appearing vagina with normal color and discharge, no lesions              Cervix: no lesions, IUD strings noted.               Pap taken: Yes.   Bimanual Exam:  Uterus:  normal size, contour, position, consistency, mobility, non-tender              Adnexa: no mass, fullness, tenderness              Rectal exam:  Deferred to GI.  Chaperone was present for exam.  Assessment:   Well woman visit with normal exam. Status post BTL.  Mirena IUD patient. Due for removal at end of this year, Dec. 2018. Hx LEEP and cryo to cervix.  Right breast pain.  FH of colon cancer.  Rectal bleeding.  Hx hemorrhoids. Smoker.  Plans to quit.   Plan: Mammogram screening discussed. Will schedule bilateral dx mammogram and right breast US.  Recommended self breast awareness. Pap and HR HPV as above. Guidelines for Calcium, Vitamin D, regular exercise program including cardiovascular and weight bearing exercise. Referral back to Dr. Earlean Shawl.  IUD exchange toward end of the year. Smoking cessation discussed.  Follow up annually and prn.       After visit summary provided.

## 2016-12-16 ENCOUNTER — Encounter: Payer: Self-pay | Admitting: Obstetrics and Gynecology

## 2016-12-16 ENCOUNTER — Ambulatory Visit (INDEPENDENT_AMBULATORY_CARE_PROVIDER_SITE_OTHER): Payer: 59 | Admitting: Obstetrics and Gynecology

## 2016-12-16 VITALS — BP 142/80 | HR 84 | Resp 18 | Ht 67.0 in | Wt 200.8 lb

## 2016-12-16 DIAGNOSIS — K625 Hemorrhage of anus and rectum: Secondary | ICD-10-CM

## 2016-12-16 DIAGNOSIS — Z01419 Encounter for gynecological examination (general) (routine) without abnormal findings: Secondary | ICD-10-CM

## 2016-12-16 DIAGNOSIS — N644 Mastodynia: Secondary | ICD-10-CM | POA: Diagnosis not present

## 2016-12-16 NOTE — Patient Instructions (Signed)

## 2016-12-16 NOTE — Progress Notes (Signed)
Spoke with Rodman Pickle at [facility]. Patient scheduled while in office for bilateral diagnostic MMG and right breast ultrasound. Patient scheduled for 12/18/16 arriving at 3pm for 3:10pm appointment.   Spoke with Amy at [facility] [phone] to schedule with Dr. Earlean Shawl for evaluation of rectal bleeding. Patient scheduled for 01/19/17 at 3:40pm. Patient is agreeable to date and time.

## 2016-12-18 ENCOUNTER — Ambulatory Visit
Admission: RE | Admit: 2016-12-18 | Discharge: 2016-12-18 | Disposition: A | Discharge status: Home / Self Care | Payer: 59 | Source: Ambulatory Visit | Attending: Obstetrics and Gynecology | Admitting: Obstetrics and Gynecology

## 2016-12-18 ENCOUNTER — Other Ambulatory Visit: Payer: Self-pay | Admitting: Obstetrics and Gynecology

## 2016-12-18 DIAGNOSIS — N644 Mastodynia: Secondary | ICD-10-CM

## 2016-12-22 LAB — IPS PAP TEST WITH HPV

## 2016-12-24 ENCOUNTER — Telehealth: Payer: Self-pay

## 2016-12-24 ENCOUNTER — Other Ambulatory Visit: Payer: Self-pay | Admitting: Obstetrics and Gynecology

## 2016-12-24 DIAGNOSIS — Z30431 Encounter for routine checking of intrauterine contraceptive device: Secondary | ICD-10-CM

## 2016-12-24 NOTE — Telephone Encounter (Signed)
Left message to call Andrea [name] at [phone]. 

## 2016-12-24 NOTE — Telephone Encounter (Signed)
Spoke with patient. Advised of message as seen below from [place]. Patient states she has been having thick vaginal discharge since yesterday that is yellow tinged in color. Denies any pelvic pain, fever,or chills. States she has had a few days were she has had vaginal discomfort. Describes this as "feeling dry." Denies any pain or discomfort at this time. Advised will review with Dr.Silva and return call with further recommendations. Patient is agreeable.

## 2016-12-24 NOTE — Telephone Encounter (Signed)
Nunzio Cobbs, MD  P [facility] Triage Pool  Cc: Lowella Fairy, CMA        Please report pap results to patient.  She has ASCUS and negative HR HPV, so next pap is due in 3 years.  02 recall.  The pap also detected Actinomyces, a bacterial organism seen in up to 7% of IUD patients.  If she is having pelvic pain or discharge, the IUD should be removed and cultured, a pelvic ultrasound performed to look for infection, and then possible antibiotics.   Cc- Marisa Sprinkles    Left message to call Verline Lema at [phone].

## 2016-12-24 NOTE — Telephone Encounter (Signed)
Reviewed with Dr.Silva who recommends that the patient be seen for further evaluation with PUS in the office to make further plan for care with possible IUD removal if needed. May need to start on medication at that time as well. Patient has been notified of recommendations and is agreeable. PUS scheduled for 12/31/2016 at 3 pm with 3:30 pm consult with Dr.Silva. Patient is agreeable to date and time.  Dr.Silva, please advise diagnosis for order.

## 2016-12-24 NOTE — Telephone Encounter (Signed)
Diagnoses are actinomyces and IUD check up. Patient will also need precert for possible IUD removal.   I will place the order for pelvic ultrasound and IUD removal.

## 2016-12-24 NOTE — Telephone Encounter (Signed)
Opened in error

## 2016-12-30 ENCOUNTER — Telehealth: Payer: Self-pay | Admitting: *Deleted

## 2016-12-30 NOTE — Telephone Encounter (Signed)
Call to patient regarding appointment tomorrow for pelvic ultrasound. 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 Call back to patient and stressed importance of proceeding with appointment as scheduled; financial arrangements to meet patients needs discussed. Patient agreeable to keep appointment.   Routing to provider for final review. Patient agreeable to disposition. Will close encounter.

## 2016-12-31 ENCOUNTER — Encounter: Payer: Self-pay | Admitting: Obstetrics and Gynecology

## 2016-12-31 ENCOUNTER — Ambulatory Visit (INDEPENDENT_AMBULATORY_CARE_PROVIDER_SITE_OTHER): Payer: 59

## 2016-12-31 ENCOUNTER — Ambulatory Visit (INDEPENDENT_AMBULATORY_CARE_PROVIDER_SITE_OTHER): Payer: 59 | Admitting: Obstetrics and Gynecology

## 2016-12-31 DIAGNOSIS — Z30431 Encounter for routine checking of intrauterine contraceptive device: Secondary | ICD-10-CM | POA: Diagnosis not present

## 2016-12-31 NOTE — Progress Notes (Signed)
GYNECOLOGY  VISIT   HPI: 45 y.o.   Married  Caucasian  female   [phone] with No LMP recorded. Patient is not currently having periods (Reason: IUD).   here for pelvic ultrasound for IUD check and evaluation for potential pelvic infection.   Pap showed ASCUS and negative HR HPV.  Actinomyces noted.  Patient called and queried about symptoms about any potential symptoms of PID.  In retrospect patient thinks some of her symptoms at the time of the call were because her menses were about to start.  Did have some yellow discharge which then became brown from her period. Mild discomfort during sex.  No abdominal pain.  States she can "feel her IUD poke into the rectum at times" when she changes position.  Hx endometriosis.   No fevers but did recently have a URI which resolved.   GYNECOLOGIC HISTORY: No LMP recorded. Patient is not currently having periods (Reason: IUD). Contraception:  Mirena IUD inserted 09/2012. Menopausal hormone therapy:  none Last mammogram:  12/18/16 Diagnostic MMG w/ [place] - Right; BIRADS 1 negative Last pap smear:   12/16/16 ASCUS and negative HR HPV, pap detected Actinomyces, a bacterial organism seen in up to 7% of IUD patients        OB History    Gravida Para Term Preterm AB Living   3 2     1 2    SAB TAB Ectopic Multiple Live Births   1                 There are no active problems to display for this patient.   Past Medical History:  Diagnosis Date  . Abnormal Pap smear of cervix    2010-2014 abnormal pap every 3-6 months  ? Dysplasia  . Anxiety   . Depression   . DUB (dysfunctional uterine bleeding)   . Endometriosis   . Infertility, female   . Recurrent bacterial infection     Past Surgical History:  Procedure Laterality Date  . ABLATION ON ENDOMETRIOSIS  1995  . CERVICAL BIOPSY  W/ LOOP ELECTRODE EXCISION    . COLPOSCOPY     x 3  . DILATION AND CURETTAGE OF UTERUS  1993  . GYNECOLOGIC CRYOSURGERY    . INTRAUTERINE DEVICE (IUD)  INSERTION  2014    (Mirena)  . TUBAL LIGATION     2010    Current Outpatient Prescriptions  Medication Sig Dispense Refill  . Ibuprofen (ADVIL) 200 MG CAPS Take by mouth as needed.    Marland Kitchen levonorgestrel (MIRENA) 20 MCG/24HR IUD 1 each by Intrauterine route once.    Marland Kitchen omeprazole (PRILOSEC) 40 MG capsule Take 40 mg by mouth as needed.     . phentermine 37.5 MG capsule Take 37.5 mg by mouth every morning.    . NONFORMULARY OR COMPOUNDED ITEM Boric acid suppository 200 mg, size O gelatin capsule.  One pv twice weekly.  With flare, use daily pv for 3 days. (Patient not taking: Reported on 12/31/2016) 30 each 0   No current facility-administered medications for this visit.      ALLERGIES: Patient has no known allergies.  Family History  Problem Relation Age of Onset  . Cancer Mother 54    Dec.Stage IV colon ca 10/2013--age 62  . Heart disease Father   . Heart disease Paternal Grandfather   . Other Paternal Grandfather     Dec MVA  . Other Brother     Dec in a MVA age  85  . Alzheimer's disease Maternal Grandfather     Dec  . Other Maternal Grandfather   . Other Paternal Grandmother     Dec MVA  . Heart failure Paternal Aunt     Stage 4 colon cancer    Social History   Social History  . Marital status: Married    Spouse name: N/A  . Number of children: N/A  . Years of education: N/A   Occupational History  . Not on file.   Social History Main Topics  . Smoking status: Current Every Day Smoker    Types: Cigarettes  . Smokeless tobacco: Never Used     Comment: smokes 1pack per week(2-3 cigs per day)  . Alcohol use Yes     Comment: 6-7 drinks a week (alcohol)  Red Bull and Vodka or red wine  . Drug use: No  . Sexual activity: Yes    Partners: Male    Birth control/ protection: IUD, Surgical     Comment: BTL/ Mirena IUD inserted 09/2012   Other Topics Concern  . Not on file   Social History Narrative  . No narrative on file    ROS:  Pertinent items are noted in  HPI.  PHYSICAL EXAMINATION:    BP 116/72 (BP Location: Right Arm, Patient Position: Sitting, Cuff Size: Normal)   Pulse 80   Resp 16   Ht 5\' 7"  (1.702 m)   Wt 200 lb (90.7 kg)   BMI 31.32 kg/m     General appearance: alert, cooperative and appears stated age  Pelvic: External genitalia:  no lesions              Urethra:  normal appearing urethra with no masses, tenderness or lesions              Bartholins and Skenes: normal                 Vagina: normal appearing vagina with normal color and discharge, no lesions              Cervix: no lesions, IUD strings noted.  No CMT.                Bimanual Exam:  Uterus:  Uterus mildly tender with palpation to abdominal hand.              Adnexa: normal adnexa and no mass, fullness, tenderness             Chaperone was present for exam.  Pelvic ultrasound: Uterus with small 1.5 cm fibroid.  Potential adenomyosis. IUD in endometrial canal.   EMS 5.83 mm. Normal ovaries and adnexa.  No free fluid.  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  Mirena IUD patient.  Doubt PID today. Hx endometriosis.  Potential adenomyosis noted on ultrasound today.  Small fibroid. No evidence of abscess.  PLAN  Discussion of fibroid. Discussion of signs and symptoms of PID.  We decided together not to remove the IUD and that her recent symptoms were due to her menses.  She will have a low threshold to call for removal.  If PID suspected, will treat likely with Ceftriaxone, oral doxycycline. IUD would need to be cultured and if Actinomyces confirmed, a course of PCN will be added.  When IUD is due for removal at the end of the year, I would recommend its removal and culture it prior to placing a new IUD, if a new IUD is desired.   An After Visit Summary  was printed and given to the patient.  _25_____ minutes face to face time of which over 50% was spent in counseling.

## 2016-12-31 NOTE — Patient Instructions (Signed)
Pelvic Inflammatory Disease Pelvic inflammatory disease (PID) refers to an infection in some or all of the female organs. The infection can be in the uterus, ovaries, fallopian tubes, or the surrounding tissues in the pelvis. PID can cause abdominal or pelvic pain that comes on suddenly (acute pelvic pain). PID is a serious infection because it can lead to lasting (chronic) pelvic pain or the inability to have children (infertility). What are the causes? This condition is most often caused by an infection that is spread during sexual contact. However, the infection can also be caused by the normal bacteria that are found in the vaginal tissues if these bacteria travel upward into the reproductive organs. PID can also occur following:  The birth of a baby.  A miscarriage.  An abortion.  Major pelvic surgery.  The use of an intrauterine device (IUD).  A sexual assault. What increases the risk? This condition is more likely to develop in women who:  Are younger than 48 years of age.  Are sexually active at [facility] age.  Use nonbarrier contraception.  Have multiple sexual partners.  Have sex with someone who has symptoms of an STD (sexually transmitted disease).  Use oral contraception. At times, certain behaviors can also increase the possibility of getting PID, such as:  Using a vaginal douche.  Having an IUD in place. What are the signs or symptoms? Symptoms of this condition include:  Abdominal or pelvic pain.  Fever.  Chills.  Abnormal vaginal discharge.  Abnormal uterine bleeding.  Unusual pain shortly after the end of a menstrual period.  Painful urination.  Pain with sexual intercourse.  Nausea and vomiting. How is this diagnosed? To diagnose this condition, your health care provider will do a physical exam and take your medical history. A pelvic exam typically reveals great tenderness in the uterus and the surrounding pelvic tissues. You may also have  tests, such as:  Lab tests, including a pregnancy test, blood tests, and urine test.  Culture tests of the vagina and cervix to check for an STD.  Ultrasound.  A laparoscopic procedure to look inside the pelvis.  Examining vaginal secretions under a microscope. How is this treated? Treatment for this condition may involve one or more approaches.  Antibiotic medicines may be prescribed to be taken by mouth.  Sexual partners may need to be treated if the infection is caused by an STD.  For more severe cases, hospitalization may be needed to give antibiotics directly into a vein through an IV tube.  Surgery may be needed if other treatments do not help, but this is rare. It may take weeks until you are completely well. If you are diagnosed with PID, you should also be checked for human immunodeficiency virus (HIV). Your health care provider may test you for infection again 3 months after treatment. You should not have unprotected sex. Follow these instructions at home:  Take over-the-counter and prescription medicines only as told by your health care provider.  If you were prescribed an antibiotic medicine, take it as told by your health care provider. Do not stop taking the antibiotic even if you start to feel better.  Do not have sexual intercourse until treatment is completed or as told by your health care provider. If PID is confirmed, your recent sexual partners will need treatment, especially if you had unprotected sex.  Keep all follow-up visits as told by your health care provider. This is important. Contact a health care provider if:  You have increased  or abnormal vaginal discharge.  Your pain does not improve.  You vomit.  You have a fever.  You cannot tolerate your medicines.  Your partner has an STD.  You have pain when you urinate. Get help right away if:  You have increased abdominal or pelvic pain.  You have chills.  Your symptoms are not better in 72  hours even with treatment. This information is not intended to replace advice given to you by your health care provider. Make sure you discuss any questions you have with your health care provider. Document Released: 10/12/2005 Document Revised: 03/19/2016 Document Reviewed: 11/19/2014 Elsevier Interactive Patient Education  2017 [employer].

## 2017-01-02 ENCOUNTER — Encounter: Payer: Self-pay | Admitting: Obstetrics and Gynecology

## 2017-06-03 ENCOUNTER — Encounter (HOSPITAL_COMMUNITY): Payer: Self-pay | Admitting: Emergency Medicine

## 2017-06-03 ENCOUNTER — Emergency Department (HOSPITAL_COMMUNITY)
Admission: EM | Admit: 2017-06-03 | Discharge: 2017-06-04 | Disposition: A | Discharge status: Home / Self Care | Payer: 59 | Attending: Emergency Medicine | Admitting: Emergency Medicine

## 2017-06-03 ENCOUNTER — Emergency Department (HOSPITAL_COMMUNITY): Payer: 59

## 2017-06-03 DIAGNOSIS — R000 Tachycardia, unspecified: Secondary | ICD-10-CM | POA: Diagnosis not present

## 2017-06-03 DIAGNOSIS — R0602 Shortness of breath: Secondary | ICD-10-CM | POA: Insufficient documentation

## 2017-06-03 DIAGNOSIS — R110 Nausea: Secondary | ICD-10-CM | POA: Diagnosis not present

## 2017-06-03 DIAGNOSIS — R42 Dizziness and giddiness: Secondary | ICD-10-CM | POA: Diagnosis not present

## 2017-06-03 DIAGNOSIS — H8111 Benign paroxysmal vertigo, right ear: Secondary | ICD-10-CM

## 2017-06-03 DIAGNOSIS — R079 Chest pain, unspecified: Secondary | ICD-10-CM | POA: Diagnosis present

## 2017-06-03 DIAGNOSIS — Z79899 Other long term (current) drug therapy: Secondary | ICD-10-CM | POA: Diagnosis not present

## 2017-06-03 DIAGNOSIS — F17210 Nicotine dependence, cigarettes, uncomplicated: Secondary | ICD-10-CM | POA: Diagnosis not present

## 2017-06-03 DIAGNOSIS — R0789 Other chest pain: Secondary | ICD-10-CM

## 2017-06-03 LAB — CBC
HCT: 40.7 % (ref 36.0–46.0)
Hemoglobin: 14.2 g/dL (ref 12.0–15.0)
MCH: 31.3 pg (ref 26.0–34.0)
MCHC: 34.9 g/dL (ref 30.0–36.0)
MCV: 89.8 fL (ref 78.0–100.0)
Platelets: 289 10*3/uL (ref 150–400)
RBC: 4.53 MIL/uL (ref 3.87–5.11)
RDW: 12.5 % (ref 11.5–15.5)
WBC: 9.1 10*3/uL (ref 4.0–10.5)

## 2017-06-03 LAB — BASIC METABOLIC PANEL
Anion gap: 7 (ref 5–15)
BUN: 18 mg/dL (ref 6–20)
CO2: 30 mmol/L (ref 22–32)
Calcium: 9.2 mg/dL (ref 8.9–10.3)
Chloride: 100 mmol/L — ABNORMAL LOW (ref 101–111)
Creatinine, Ser: 0.93 mg/dL (ref 0.44–1.00)
GFR calc Af Amer: >60 mL/min (ref >60)
GFR calc non Af Amer: >60 mL/min (ref >60)
Glucose, Bld: 103 mg/dL — ABNORMAL HIGH (ref 65–99)
Potassium: 4.4 mmol/L (ref 3.5–5.1)
Sodium: 137 mmol/L (ref 135–145)

## 2017-06-03 LAB — I-STAT TROPONIN, ED: Troponin i, poc: 0.01 ng/mL (ref 0.00–0.08)

## 2017-06-03 NOTE — ED Provider Notes (Signed)
TIME SEEN: 11:06 PM  CHIEF COMPLAINT: Chest pain, vertigo  HPI: Patient is a 46 year old female with history of GERD, recent diagnosis of hypertension, tobacco use who presents to the emergency department with complaints of an episode of chest pain that occurred at 3 PM while sitting talking on the phone. She states that it started off as feeling like "something popped" and then she felt a tightness in her chest for a few seconds and then a diffuse burning across the anterior chest. She felt short of breath with this. Earlier today she felt poorly and had had nausea but attributes this to recent vertigo that she has had for the past 3 weeks, worse when she turns her head to the right. She did also feel sweaty today but thought she could've had a fever. She denies any history of cardiac disease. No history of PE or DVT. No lower extremity swelling or pain. No cough.  No chest pain currently. Has never had similar symptoms. She states she was concerned because she has significant family history of coronary artery disease. States her grandfather had an MI at 8 years old, uncle had an MI at 62 years old, father had an MI at 6 years old. She has never had a stress test or a cardiac catheterization. She does have follow-up with a cardiologist scheduled on August 31.  She complains of vertigo for the past 3 weeks. States that it started when she turned over onto her right side in bed and she feels that the room is spinning. She has been on meclizine and Zofran without much relief. She has a referral to an ENT physician as an outpatient. She has tried Epley maneuvers at home without relief. No ear pain, tinnitus, hearing loss. No numbness, tingling or focal weakness. No head injury. No headache.  ROS: See HPI Constitutional: no fever  Eyes: no drainage  ENT: no runny nose   Cardiovascular:  no chest pain  Resp: no SOB  GI: no vomiting GU: no dysuria Integumentary: no rash  Allergy: no hives   Musculoskeletal: no leg swelling  Neurological: no slurred speech ROS otherwise negative  PAST MEDICAL HISTORY/PAST SURGICAL HISTORY:  Past Medical History:  Diagnosis Date  . Abnormal Pap smear of cervix    2010-2014 abnormal pap every 3-6 months  ? Dysplasia  . Anxiety   . Depression   . DUB (dysfunctional uterine bleeding)   . Endometriosis   . Infertility, female   . Recurrent bacterial infection     MEDICATIONS:  Prior to Admission medications   Medication Sig Start Date End Date Taking? Authorizing Provider  Ibuprofen (ADVIL) 200 MG CAPS Take by mouth as needed.    [provider]  levonorgestrel (MIRENA) 20 MCG/24HR IUD 1 each by Intrauterine route once.    [provider]  NONFORMULARY OR COMPOUNDED ITEM Boric acid suppository 200 mg, size O gelatin capsule.  One pv twice weekly.  With flare, use daily pv for 3 days. Patient not taking: Reported on 12/31/2016 12/11/15   Nunzio Cobbs, MD  omeprazole (PRILOSEC) 40 MG capsule Take 40 mg by mouth as needed.     [provider]  phentermine 37.5 MG capsule Take 37.5 mg by mouth every morning.    [provider]    ALLERGIES:  No Known Allergies  SOCIAL HISTORY:  Social History  Substance Use Topics  . Smoking status: Current Every Day Smoker    Types: Cigarettes  . Smokeless tobacco: Never  Used     Comment: smokes 1pack per week(2-3 cigs per day)  . Alcohol use Yes     Comment: 6-7 drinks a week (alcohol)  Red Bull and Vodka or red wine    FAMILY HISTORY: Family History  Problem Relation Age of Onset  . Cancer Mother 37       Dec.Stage IV colon ca 10/2013--age 48  . Heart disease Father   . Heart disease Paternal Grandfather   . Other Paternal Grandfather        Dec MVA  . Other Brother        Dec in a MVA age 43  . Alzheimer's disease Maternal Grandfather        Dec  . Other Maternal Grandfather   . Other Paternal Grandmother        Dec MVA  . Heart failure  Paternal Aunt        Stage 4 colon cancer    EXAM: BP (!) 165/84 (BP Location: Left Arm)   Pulse 100   Temp 98.6 F (37 C) (Oral)   Resp 20   SpO2 100%  CONSTITUTIONAL: Alert and oriented and responds appropriately to questions. Well-appearing; well-nourished HEAD: Normocephalic EYES: Conjunctivae clear, pupils appear equal, EOMI ENT: normal nose; moist mucous membranes; TMs are clear bilaterally without erythema, purulence, bulging, perforation, effusion.  No cerumen impaction or sign of foreign body in the external auditory canal. No inflammation, erythema or drainage from the external auditory canal. No signs of mastoiditis. No pain with manipulation of the pinna bilaterally. NECK: Supple, no meningismus, no nuchal rigidity, no LAD  CARD: RRR; S1 and S2 appreciated; no murmurs, no clicks, no rubs, no gallops RESP: Normal chest excursion without splinting or tachypnea; breath sounds clear and equal bilaterally; no wheezes, no rhonchi, no rales, no hypoxia or respiratory distress, speaking full sentences ABD/GI: Normal bowel sounds; non-distended; soft, non-tender, no rebound, no guarding, no peritoneal signs, no hepatosplenomegaly BACK:  The back appears normal and is non-tender to palpation, there is no CVA tenderness EXT: Normal ROM in all joints; non-tender to palpation; no edema; normal capillary refill; no cyanosis, no calf tenderness or swelling    SKIN: Normal color for age and race; warm; no rash NEURO: Moves all extremities equally, cranial nerves II through XII intact, sensation to light touch intact diffusely, normal gait, normal speech PSYCH: The patient's mood and manner are appropriate. Grooming and personal hygiene are appropriate.  MEDICAL DECISION MAKING: Patient here with reports of chest pain that lasted for several minutes and then resolved. Chest pain seems very atypical. First set of cardiac labs negative. Chest x-ray clear. Symptom-free at this time. Doubt PE or  dissection. EKG shows sinus tachycardia but this has improved. Plan is to repeat second troponin. If this is negative and she is still asymptomatic, will discharge home. Her heart score is 3.  As for her vertigo, I suspect this is BPPV. She has outpatient ENT follow-up being scheduled by her primary care provider. She is on meclizine and Zofran. Have offered her Valium but she states she does not take any medication that may make her drowsy. She is otherwise neurologically intact. She is not vomiting. She is able to ambulate.  ED PROGRESS: Patient's second troponin is negative. She is still hemodynamically stable and a symptomatic. Full she is safe to be discharged home with close outpatient follow-up. She has cardiology appointment scheduled and she states she is giving a referral for ENT.  At this time, I  do not feel there is any life-threatening condition present. I have reviewed and discussed all results (EKG, imaging, lab, urine as appropriate) and exam findings with patient/family. I have reviewed nursing notes and appropriate previous records.  I feel the patient is safe to be discharged home without further emergent workup and can continue workup as an outpatient as needed. Discussed usual and customary return precautions. Patient/family verbalize understanding and are comfortable with this plan.  Outpatient follow-up has been provided if needed. All questions have been answered.      EKG Interpretation  Date/Time:  Thursday June 03 2017 16:30:59 EDT Ventricular Rate:  108 PR Interval:    QRS Duration: 80 QT Interval:  334 QTC Calculation: 448 R Axis:   3 Text Interpretation:  Sinus tachycardia Probable left atrial enlargement No old tracing to compare Confirmed by Wagner Tanzi, Cyril Mourning [id number]) on 06/03/2017 11:06:44 PM         Kady Toothaker, Delice Bison, DO 06/04/17 0111

## 2017-06-03 NOTE — ED Triage Notes (Signed)
Pt sudden onset intermittent central chest pressure and SOB at 1505; denies chest pressure of SOB at pressure time but does report "vertigo symptoms for past month."

## 2017-06-04 LAB — POCT I-STAT TROPONIN I: Troponin i, poc: 0 ng/mL (ref 0.00–0.08)

## 2017-06-25 ENCOUNTER — Ambulatory Visit (INDEPENDENT_AMBULATORY_CARE_PROVIDER_SITE_OTHER): Payer: 59 | Admitting: Cardiovascular Disease

## 2017-06-25 ENCOUNTER — Other Ambulatory Visit: Payer: Self-pay

## 2017-06-25 ENCOUNTER — Ambulatory Visit (HOSPITAL_COMMUNITY): Payer: 59 | Attending: Cardiovascular Disease

## 2017-06-25 ENCOUNTER — Encounter: Payer: Self-pay | Admitting: Cardiovascular Disease

## 2017-06-25 VITALS — BP 134/70 | HR 97 | Ht 67.0 in | Wt 201.0 lb

## 2017-06-25 DIAGNOSIS — R0789 Other chest pain: Secondary | ICD-10-CM | POA: Diagnosis not present

## 2017-06-25 DIAGNOSIS — I119 Hypertensive heart disease without heart failure: Secondary | ICD-10-CM | POA: Insufficient documentation

## 2017-06-25 DIAGNOSIS — F17210 Nicotine dependence, cigarettes, uncomplicated: Secondary | ICD-10-CM | POA: Insufficient documentation

## 2017-06-25 DIAGNOSIS — R42 Dizziness and giddiness: Secondary | ICD-10-CM | POA: Diagnosis not present

## 2017-06-25 DIAGNOSIS — I5189 Other ill-defined heart diseases: Secondary | ICD-10-CM

## 2017-06-25 HISTORY — DX: Other ill-defined heart diseases: I51.89

## 2017-06-25 LAB — ECHOCARDIOGRAM COMPLETE
Height: 67 in
Weight: 3216 oz

## 2017-06-25 NOTE — Patient Instructions (Signed)
Medication Instructions:  Your physician recommends that you continue on your current medications as directed. Please refer to the Current Medication list given to you today.   Labwork: None Ordered   Testing/Procedures: Your physician has requested that you have an echocardiogram. Echocardiography is a painless test that uses sound waves to create images of your heart. It provides your doctor with information about the size and shape of your heart and how well your heart's chambers and valves are working. This procedure takes approximately one hour. There are no restrictions for this procedure.  Your physician has requested that you have a Coronary CT. Our office will contact you to get this scheduled. The test will take place at [place]: Your physician recommends that you schedule a follow-up appointment in: 2-3 months with Dr. Acie Fredrickson.   If you need a refill on your cardiac medications before your next appointment, please call your pharmacy.   Thank you for choosing [facility]! Christen Bame, RN [phone]

## 2017-06-25 NOTE — Progress Notes (Signed)
Cardiology Office Note:    Date:  06/25/2017   ID:  Andrea [name], Andrea [name] 10-07-1971, MRN [id number]  PCP:  Jilda Panda, MD  Cardiologist:  Mertie Moores, MD    Referring MD: Jilda Panda, MD   Chief Complaint  Patient presents with  . Chest Pain  . Dizziness   Problem List   1. Essential hypertension History of chest pain Dizziness   History of Present Illness:    Andrea [name] is a 46 y.o. female with a hx of Hypertension who presents today for episodes of chest tightness and dizziness.  [name] is seen for the first time today  She has had some symptoms of diaphoresis on occasion.   Has lightheadedness - has been diagnosed with benign positional vertigo . Occurs randomly  - does not occur regularly . Last year she was exercising more and had lots of dizziness Had stopped smoking , lost 35 lbs But relapsed, restarted smoking in Feb.    Has some episodes of chest tightness.   Has been diagnosied with HTN  - tried [place] - HCT but she had hypotension.     Has   Has been on phentermine for about a year.   Past Medical History:  Diagnosis Date  . Abnormal Pap smear of cervix    2010-2014 abnormal pap every 3-6 months  ? Dysplasia  . Anxiety   . Depression   . DUB (dysfunctional uterine bleeding)   . Endometriosis   . Infertility, female   . Recurrent bacterial infection     Past Surgical History:  Procedure Laterality Date  . ABLATION ON ENDOMETRIOSIS  1995  . CERVICAL BIOPSY  W/ LOOP ELECTRODE EXCISION    . COLPOSCOPY     x 3  . DILATION AND CURETTAGE OF UTERUS  1993  . GYNECOLOGIC CRYOSURGERY    . INTRAUTERINE DEVICE (IUD) INSERTION  2014    (Mirena)  . TUBAL LIGATION     2010    Current Medications: Current Meds  Medication Sig  . Ibuprofen (ADVIL) 200 MG CAPS Take 600 mg by mouth as needed (pain).   Marland Kitchen levonorgestrel (MIRENA) 20 MCG/24HR IUD 1 each by Intrauterine route once.  . meclizine (ANTIVERT) 12.5 MG tablet Take 1 tablet by  mouth as needed for dizziness.   Marland Kitchen omeprazole (PRILOSEC) 40 MG capsule Take 40 mg by mouth daily.   . phentermine 37.5 MG capsule Take 37.5 mg by mouth every morning.  Marland Kitchen PRESCRIPTION MEDICATION Take 0.5 tablets by mouth daily.     Allergies:   Patient has no known allergies.   Social History   Social History  . Marital status: Married    Spouse name: N/A  . Number of children: N/A  . Years of education: N/A   Social History Main Topics  . Smoking status: Current Every Day Smoker    Types: Cigarettes  . Smokeless tobacco: Never Used     Comment: smokes 1pack per week(2-3 cigs per day)  . Alcohol use Yes     Comment: 6-7 drinks a week (alcohol)  Red Bull and Vodka or red wine  . Drug use: No  . Sexual activity: Yes    Partners: Male    Birth control/ protection: IUD, Surgical     Comment: BTL/ Mirena IUD inserted 09/2012   Other Topics Concern  . None   Social History Narrative  . None     Family History: The patient's family history includes Alzheimer's disease in her maternal grandfather;  Cancer (age of onset: 58) in her mother; Heart disease in her father and paternal grandfather; Heart failure in her paternal aunt; Other in her brother, maternal grandfather, paternal grandfather, and paternal grandmother. ROS:   Please see the history of present illness.     All other systems reviewed and are negative.  EKGs/Labs/Other Studies Reviewed:    The following studies were reviewed today:   EKG:  EKG is  ordered aug. 9.  Sinus tach at 108.  No ST or T wave changes   Recent Labs: 06/03/2017: BUN 18; Creatinine, Ser 0.93; Hemoglobin 14.2; Platelets 289; Potassium 4.4; Sodium 137  Recent Lipid Panel No results found for: CHOL, TRIG, HDL, CHOLHDL, VLDL, LDLCALC, LDLDIRECT  Physical Exam:    VS:  BP 134/70   Pulse 97   Ht 5\' 7"  (1.702 m)   Wt 201 lb (91.2 kg)   SpO2 97%   BMI 31.48 kg/m     Wt Readings from Last 3 Encounters:  06/25/17 201 lb (91.2 kg)  12/31/16  200 lb (90.7 kg)  12/16/16 200 lb 12.8 oz (91.1 kg)     GEN:  Moderately obese,   NAD  HEENT: Normal NECK: No JVD; No carotid bruits LYMPHATICS: No lymphadenopathy CARDIAC:  RR, no murmurs, rubs, gallops RESPIRATORY:  Clear to auscultation without rales, wheezing or rhonchi  ABDOMEN: Soft, non-tender, non-distended MUSCULOSKELETAL:  No edema; No deformity  SKIN: Warm and dry NEUROLOGIC:  Alert and oriented x 3 PSYCHIATRIC:  Normal affect   ASSESSMENT:    No diagnosis found. PLAN:    In order of problems listed above:  1.   Chest tightness:   Symptoms are concerning.   Has a strong family hx of CAD Father diet at age 17 of MI Uncle died at 38 of MI  2nd uncle had CABG  At 36.   Will get a coronary calcium score and CT angiogram with FFR .   2. Lightheadedness: She clearly has symptoms of vertigo.. She became very dizzy when she turned her head to the side. She also may have some dizziness that is not related to turning her head. We'll get an echocardiogram for further evaluation of this lightheadedness.  3. Palpitations: She has been on phentermine for about a year. I've advised her to stop the phentermine and see if the  palpitations resolve. I think it would be most useful to place a monitor after she's off the phentermine.  Medication Adjustments/Labs and Tests Ordered: Current medicines are reviewed at length with the patient today.  Concerns regarding medicines are outlined above.  No orders of the defined types were placed in this encounter.  No orders of the defined types were placed in this encounter.   Signed, Mertie Moores, MD  06/25/2017 10:18 AM    [place]

## 2017-07-15 ENCOUNTER — Encounter: Payer: Self-pay | Admitting: Cardiovascular Disease

## 2017-07-23 ENCOUNTER — Ambulatory Visit (HOSPITAL_COMMUNITY)
Admission: RE | Admit: 2017-07-23 | Discharge: 2017-07-23 | Disposition: A | Discharge status: Home / Self Care | Payer: 59 | Source: Ambulatory Visit | Attending: Cardiovascular Disease | Admitting: Cardiovascular Disease

## 2017-07-23 ENCOUNTER — Ambulatory Visit (HOSPITAL_COMMUNITY): Admission: RE | Admit: 2017-07-23 | Payer: 59 | Source: Ambulatory Visit

## 2017-07-23 DIAGNOSIS — R0789 Other chest pain: Secondary | ICD-10-CM | POA: Diagnosis present

## 2017-07-23 DIAGNOSIS — R079 Chest pain, unspecified: Secondary | ICD-10-CM | POA: Diagnosis not present

## 2017-07-23 MED ORDER — NITROGLYCERIN 0.4 MG SL SUBL
SUBLINGUAL_TABLET | SUBLINGUAL | Status: AC
  Filled 2017-07-23: qty 2

## 2017-07-23 MED ORDER — METOPROLOL TARTRATE 5 MG/5ML IV SOLN
INTRAVENOUS | Status: AC
  Filled 2017-07-23: qty 5

## 2017-07-23 MED ORDER — IOPAMIDOL (ISOVUE-370) INJECTION 76%
INTRAVENOUS | Status: AC
  Administered 2017-07-23: 80 mL
  Filled 2017-07-23: qty 100

## 2017-07-23 MED ORDER — NITROGLYCERIN 0.4 MG SL SUBL
0.4000 mg | SUBLINGUAL_TABLET | SUBLINGUAL | Status: DC | PRN
  Administered 2017-07-23: 0.8 mg via SUBLINGUAL

## 2017-07-23 MED ORDER — METOPROLOL TARTRATE 5 MG/5ML IV SOLN
INTRAVENOUS | Status: AC
  Filled 2017-07-23: qty 15

## 2017-07-23 MED ORDER — METOPROLOL TARTRATE 5 MG/5ML IV SOLN
5.0000 mg | INTRAVENOUS | Status: DC | PRN
  Administered 2017-07-23 (×4): 5 mg via INTRAVENOUS

## 2017-09-09 ENCOUNTER — Telehealth: Payer: Self-pay | Admitting: Obstetrics and Gynecology

## 2017-09-09 DIAGNOSIS — Z30432 Encounter for removal of intrauterine contraceptive device: Secondary | ICD-10-CM

## 2017-09-09 NOTE — Telephone Encounter (Signed)
Patient is ready to have her IUD exchanged. 

## 2017-09-09 NOTE — Telephone Encounter (Signed)
Spoke with patient. Patient request to schedule IUD removal. Reviewed recommendations per Dr. Quincy Simmonds from [place] dated 12/31/16 regarding IUD removal and culture prior to placing new IUD. Patient states she does not want to have IUD replaced at this time.   Scheduled for 09/28/17 at 3pm with Dr. Quincy Simmonds. Patient verbalizes understanding and is agreeable.   Order placed for IUD removal.   Routing to provider for final review. Patient is agreeable to disposition. Will close encounter.  Cc: [name]

## 2017-09-15 ENCOUNTER — Encounter: Payer: Self-pay | Admitting: Cardiovascular Disease

## 2017-09-15 ENCOUNTER — Ambulatory Visit (INDEPENDENT_AMBULATORY_CARE_PROVIDER_SITE_OTHER): Payer: 59 | Admitting: Cardiovascular Disease

## 2017-09-15 VITALS — BP 152/90 | HR 86 | Ht 67.0 in | Wt 212.8 lb

## 2017-09-15 DIAGNOSIS — R0789 Other chest pain: Secondary | ICD-10-CM

## 2017-09-15 DIAGNOSIS — R42 Dizziness and giddiness: Secondary | ICD-10-CM

## 2017-09-15 NOTE — Patient Instructions (Signed)
Medication Instructions:  Your physician recommends that you continue on your current medications as directed. Please refer to the Current Medication list given to you today.   Labwork: None Ordered   Testing/Procedures: None Ordered   Follow-Up: Your physician recommends that you schedule a follow-up appointment in: as needed with Dr. Nahser   If you need a refill on your cardiac medications before your next appointment, please call your pharmacy.   Thank you for choosing [facility]! Elvin Banker, RN [phone]    

## 2017-09-15 NOTE — Progress Notes (Signed)
Cardiology Office Note:    Date:  09/15/2017   ID:  Henriette Combs [place], [name] 1971/02/25, MRN [id number]  PCP:  Jilda Panda, MD  Cardiologist:  Mertie Moores, MD    Referring MD: Jilda Panda, MD   Chief Complaint  Patient presents with  . Chest Pain   Problem List   1. Essential hypertension History of chest pain Dizziness   History of Present Illness:    Andrea [name] is a 46 y.o. female with a hx of Hypertension who presents today for episodes of chest tightness and dizziness.  Katrianna is seen for the first time today  She has had some symptoms of diaphoresis on occasion.   Has lightheadedness - has been diagnosed with benign positional vertigo . Occurs randomly  - does not occur regularly . Last year she was exercising more and had lots of dizziness Had stopped smoking , lost 35 lbs But relapsed, restarted smoking in Feb.    Has some episodes of chest tightness.   Has been diagnosied with HTN  - tried [place] - HCT but she had hypotension.     Has   Has been on phentermine for about a year.   September 15, 2017:  Ikram is seen back today for follow-up visit. She was seen several months ago with some chest pain.  A coronary calcium score is 0. Coronary CT angiogram was normal. She was also having some palpitations that were probably related to phentermine. Has had these palps for years.  Has worn a monitir . Still has fleeting pains.  Still has vertigo .  Has been to ENT.   Needs to go again  BP is elevated  Her primary MD has started her on a BP med but she has not picked it up Still smoking  Not exercising yet  Echocardiogram showed normal left ventricular systolic function with EF of 60-65%.  She has grade 1 diastolic dysfunction.  She has trivial aortic insufficiency.    Past Medical History:  Diagnosis Date  . Abnormal Pap smear of cervix    2010-2014 abnormal pap every 3-6 months  ? Dysplasia  . Anxiety   . Depression   . DUB (dysfunctional  uterine bleeding)   . Endometriosis   . Infertility, female   . Recurrent bacterial infection     Past Surgical History:  Procedure Laterality Date  . ABLATION ON ENDOMETRIOSIS  1995  . CERVICAL BIOPSY  W/ LOOP ELECTRODE EXCISION    . COLPOSCOPY     x 3  . DILATION AND CURETTAGE OF UTERUS  1993  . GYNECOLOGIC CRYOSURGERY    . INTRAUTERINE DEVICE (IUD) INSERTION  2014    (Mirena)  . TUBAL LIGATION     2010    Current Medications: Current Meds  Medication Sig  . Ibuprofen (ADVIL) 200 MG CAPS Take 600 mg by mouth as needed (pain).   Marland Kitchen levonorgestrel (MIRENA) 20 MCG/24HR IUD 1 each by Intrauterine route once.  Marland Kitchen omeprazole (PRILOSEC) 40 MG capsule Take 40 mg by mouth daily.   Marland Kitchen PRESCRIPTION MEDICATION Take 0.5 tablets by mouth daily.     Allergies:   Patient has no known allergies.   Social History   Socioeconomic History  . Marital status: Married    Spouse name: None  . Number of children: None  . Years of education: None  . Highest education level: None  Social Needs  . Financial resource strain: None  . Food insecurity - worry: None  . Food  insecurity - inability: None  . Transportation needs - medical: None  . Transportation needs - non-medical: None  Occupational History  . None  Tobacco Use  . Smoking status: Current Every Day Smoker    Types: Cigarettes  . Smokeless tobacco: Never Used  . Tobacco comment: smokes 1pack per week(2-3 cigs per day)  Substance and Sexual Activity  . Alcohol use: Yes    Comment: 6-7 drinks a week (alcohol)  Red Bull and Vodka or red wine  . Drug use: No  . Sexual activity: Yes    Partners: Male    Birth control/protection: IUD, Surgical    Comment: BTL/ Mirena IUD inserted 09/2012  Other Topics Concern  . None  Social History Narrative  . None     Family History: The patient's family history includes Alzheimer's disease in her maternal grandfather; Cancer (age of onset: 43) in her mother; Heart disease in her father  and paternal grandfather; Heart failure in her paternal aunt; Other in her brother, maternal grandfather, paternal grandfather, and paternal grandmother. ROS:   Please see the history of present illness.     All other systems reviewed and are negative.  EKGs/Labs/Other Studies Reviewed:    The following studies were reviewed today:   EKG:  EKG is  ordered aug. 9.  Sinus tach at 108.  No ST or T wave changes   Recent Labs: 06/03/2017: BUN 18; Creatinine, Ser 0.93; Hemoglobin 14.2; Platelets 289; Potassium 4.4; Sodium 137  Recent Lipid Panel No results found for: CHOL, TRIG, HDL, CHOLHDL, VLDL, LDLCALC, LDLDIRECT  Physical Exam: Blood pressure (!) 152/90, pulse 86, height 5\' 7"  (1.702 m), weight 212 lb 12.8 oz (96.5 kg), SpO2 96 %.  GEN:  Well nourished, well developed in no acute distress HEENT: Normal NECK: No JVD; No carotid bruits LYMPHATICS: No lymphadenopathy CARDIAC: RR, no murmurs, rubs, gallops RESPIRATORY:  Clear to auscultation without rales, wheezing or rhonchi  ABDOMEN: Soft, non-tender, non-distended MUSCULOSKELETAL:  No edema; No deformity  SKIN: Warm and dry NEUROLOGIC:  Alert and oriented x 3   ASSESSMENT:    No diagnosis found. PLAN:    In order of problems listed above:  1.   Chest tightness:    She had a normal coronary CT angiogram.  Coronary calcium score is 0.  Reassured her that her heart looks great.  2. Lightheadedness: She has vertigo.  I have encouraged her to go back to see ENT.  3. Palpitations:    Likely has premature ventricular contractions.  I reassured her that these are benign.  Medication Adjustments/Labs and Tests Ordered: Current medicines are reviewed at length with the patient today.  Concerns regarding medicines are outlined above.  No orders of the defined types were placed in this encounter.  No orders of the defined types were placed in this encounter.   Signed, Mertie Moores, MD  09/15/2017 10:42 AM    [place]

## 2017-09-21 ENCOUNTER — Telehealth: Payer: Self-pay | Admitting: *Deleted

## 2017-09-21 NOTE — Telephone Encounter (Signed)
Left message to call Sharee Pimple at [phone] regarding upcoming appt.   Reschedule IUD removal scheduled for 12/4 with Dr. Quincy Simmonds.

## 2017-09-21 NOTE — Telephone Encounter (Signed)
Spoke with patient. IUD removal rescheduled to 10/06/17 at 10am with Dr.Silva.   Routing to provider for final review. Patient is agreeable to disposition. Will close encounter.

## 2017-09-28 ENCOUNTER — Ambulatory Visit: Payer: Self-pay | Admitting: Obstetrics and Gynecology

## 2017-10-06 ENCOUNTER — Other Ambulatory Visit: Payer: Self-pay

## 2017-10-06 ENCOUNTER — Ambulatory Visit: Payer: 59 | Admitting: Obstetrics and Gynecology

## 2017-10-06 ENCOUNTER — Ambulatory Visit (INDEPENDENT_AMBULATORY_CARE_PROVIDER_SITE_OTHER): Payer: 59 | Admitting: Obstetrics & Gynecology

## 2017-10-06 VITALS — BP 140/80 | HR 80 | Resp 18 | Ht 67.0 in | Wt 218.0 lb

## 2017-10-06 DIAGNOSIS — N912 Amenorrhea, unspecified: Secondary | ICD-10-CM | POA: Diagnosis not present

## 2017-10-06 DIAGNOSIS — A429 Actinomycosis, unspecified: Secondary | ICD-10-CM | POA: Diagnosis not present

## 2017-10-06 DIAGNOSIS — N882 Stricture and stenosis of cervix uteri: Secondary | ICD-10-CM

## 2017-10-06 DIAGNOSIS — T8332XA Displacement of intrauterine contraceptive device, initial encounter: Secondary | ICD-10-CM

## 2017-10-06 MED ORDER — MISOPROSTOL 200 MCG PO TABS: ORAL_TABLET | ORAL | 0 refills | Status: DC

## 2017-10-06 NOTE — Progress Notes (Signed)
GYNECOLOGY  VISIT  CC:   IUD removal  HPI: 46 y.o. G64P0012 Married Caucasian female here for IUD removal.  Pt is typically followed by Dr. Quincy Simmonds.  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  Pt denies pelvic pain or fever.  IUD due for removal this month.  Has been amenorrheic with IUD and not sure about having another one as she is not sure if she is in menopause at this time or not.  D/w pt testing as she would likely have one replaced.  Pt has BTL for contraception but has done so well from bleeding standpoint.  She was advised to have IUD removed and have culture of IUD obtained before having another IUD replaced.  Up to Date recommendations reviewed with pt.  She has not experienced any signs or symptoms of PID.  Denies fever or pelvic pain.  She has been in an 11 year relationship but they have just been married about a year.  States "we are still newly weds even though we've been together a long time".    GYNECOLOGIC HISTORY: No LMP recorded. Patient is not currently having periods (Reason: IUD). Contraception: tubal ligation Menopausal hormone therapy: none  Patient Active Problem List   Diagnosis Date Noted  . Vertigo 09/15/2017  . Chest tightness 06/25/2017  . Light headedness 06/25/2017    Past Medical History:  Diagnosis Date  . Abnormal Pap smear of cervix    2010-2014 abnormal pap every 3-6 months  ? Dysplasia  . Anxiety   . Depression   . DUB (dysfunctional uterine bleeding)   . Endometriosis   . Infertility, female   . Recurrent bacterial infection     Past Surgical History:  Procedure Laterality Date  . ABLATION ON ENDOMETRIOSIS  1995  . CERVICAL BIOPSY  W/ LOOP ELECTRODE EXCISION    . COLPOSCOPY     x 3  . DILATION AND CURETTAGE OF UTERUS  1993  . GYNECOLOGIC CRYOSURGERY    . INTRAUTERINE DEVICE (IUD) INSERTION  2014    (Mirena)  . TUBAL LIGATION     2010    MEDS:   Current Outpatient Medications on File Prior to Visit  Medication  Sig Dispense Refill  . Ibuprofen (ADVIL) 200 MG CAPS Take 600 mg by mouth as needed (pain).     Marland Kitchen levonorgestrel (MIRENA) 20 MCG/24HR IUD 1 each by Intrauterine route once.    Marland Kitchen omeprazole (PRILOSEC) 40 MG capsule Take 40 mg by mouth daily.      No current facility-administered medications on file prior to visit.     ALLERGIES: Patient has no known allergies.  Family History  Problem Relation Age of Onset  . Cancer Mother 32       Dec.Stage IV colon ca 10/2013--age 57  . Heart disease Father   . Heart disease Paternal Grandfather   . Other Paternal Grandfather        Dec MVA  . Other Brother        Dec in a MVA age 72  . Alzheimer's disease Maternal Grandfather        Dec  . Other Maternal Grandfather   . Other Paternal Grandmother        Dec MVA  . Heart failure Paternal Aunt        Stage 4 colon cancer    SH:  Married, non smoker  Review of Systems  All other systems reviewed and are negative.   PHYSICAL EXAMINATION:  BP 140/80 (BP Location: Right Arm, Patient Position: Sitting, Cuff Size: Large)   Pulse 80   Resp 18   Ht 5\' 7"  (1.702 m)   Wt 218 lb (98.9 kg)   BMI 34.14 kg/m     General appearance: alert, cooperative and appears stated age Abdomen: soft, non-tender; bowel sounds normal; no masses,  no organomegaly  Pelvic: External genitalia:  no lesions              Urethra:  normal appearing urethra with no masses, tenderness or lesions              Bartholins and Skenes: normal                 Vagina: normal appearing vagina with normal color and discharge, no lesions              Cervix: no lesions, non-visualized IUD strings              Bimanual Exam:  Uterus:  normal size, contour, position, consistency, mobility, non-tender              Adnexa: no mass, fullness, tenderness              Anus:  normal sphincter tone, no lesions  Procedure:  Speculum placed.  Cervix cleansed with Betadine x 3.  Anterior lip of cervix grasped with single toothed  tenaculum.  Stenotic os noted.  Cervix dilated with Milex dilator.  Cervix very tight likely due to prior cervical procedures.  Pt very uncomfortable with this and procedure ended due to pt discomfort.    D/W pt attempting again with ultrasound guidance and after use of Cytotec/motrin.  Pt in favor of this plan.  Chaperone was present for exam.  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 [place] obtained today   ~15 minutes spent with patient >50% of time was in face to face discussion prior to attempted IUD removal.

## 2017-10-07 ENCOUNTER — Ambulatory Visit (INDEPENDENT_AMBULATORY_CARE_PROVIDER_SITE_OTHER): Payer: 59 | Admitting: Obstetrics & Gynecology

## 2017-10-07 ENCOUNTER — Other Ambulatory Visit: Payer: Self-pay

## 2017-10-07 ENCOUNTER — Ambulatory Visit (INDEPENDENT_AMBULATORY_CARE_PROVIDER_SITE_OTHER): Payer: 59

## 2017-10-07 ENCOUNTER — Other Ambulatory Visit: Payer: Self-pay | Admitting: Obstetrics & Gynecology

## 2017-10-07 ENCOUNTER — Other Ambulatory Visit: Payer: Self-pay | Admitting: *Deleted

## 2017-10-07 ENCOUNTER — Encounter: Payer: Self-pay | Admitting: Obstetrics & Gynecology

## 2017-10-07 VITALS — BP 138/80 | HR 80 | Resp 16 | Wt 218.0 lb

## 2017-10-07 DIAGNOSIS — Z975 Presence of (intrauterine) contraceptive device: Secondary | ICD-10-CM | POA: Diagnosis not present

## 2017-10-07 DIAGNOSIS — Z30432 Encounter for removal of intrauterine contraceptive device: Secondary | ICD-10-CM

## 2017-10-07 DIAGNOSIS — D251 Intramural leiomyoma of uterus: Secondary | ICD-10-CM

## 2017-10-07 DIAGNOSIS — A429 Actinomycosis, unspecified: Secondary | ICD-10-CM | POA: Diagnosis not present

## 2017-10-07 DIAGNOSIS — Z538 Procedure and treatment not carried out for other reasons: Secondary | ICD-10-CM

## 2017-10-07 LAB — FOLLICLE STIMULATING HORMONE: FSH: 5.7 m[IU]/mL

## 2017-10-07 NOTE — Progress Notes (Signed)
46 y.o. G70P0012 Married Caucasian female here for pelvic ultrasound due to inability to see IUD string and need for removal due to actinomyces noted on Pap smear.  IUD was due for removal this month, anyway.  Attempt was made yesterday but was unsuccessful due to pt discomfort.  She took motrin and used Cytotec and is here for this today.  FSH was drawn yesterday and was low so pt is interested in another IUD if possible.    No LMP recorded. Patient is not currently having periods (Reason: IUD).  Contraception: BTL  Findings:  UTERUS: 9.8 x 6.1 x 5.2cm with 2.7 x 2.6cm fibroid, 1.1 x 0.9cm fibroid, 1.3 x 0.8cm fibroid, intramural EMS: IUD in correct location without endometrial thickening noted ADNEXA: Left ovary: 4.6 x 2.5 x 1.7cm with 2.1 x 3.0D follicle       Right ovary: 2.9 x 2.0 x 2.1cm  CUL DE SAC: no free fluid  Discussion:  Findings reviewed.  Ultrasound images reviewed.  Pt desires attempt at IUD removal again today.  Procedure:  Speculum placed.  Cervix cleansed with Betadine x 3.  Anterior lip of cervix grasped with single toothed tenaculum.  Milex dilator used to dilate cervix.  Curette tip passed through cervical os.  This was rotated and removed.  On third attempt, IUD string was noted.  Then string was grasped with ringed forceps and removed without difficulty.  Tenaculum removed.  Minimal bleeding noted.  Pt tolerated procedure well.  All instruments removed.  IUD sent for culture.  Uterus palpated at end of exam and was non tender.  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  Plan:  Culture results will be called to pt.  If negative, pt does want IUD replaced.

## 2017-10-13 ENCOUNTER — Other Ambulatory Visit: Payer: Self-pay | Admitting: *Deleted

## 2017-10-13 ENCOUNTER — Telehealth: Payer: Self-pay | Admitting: *Deleted

## 2017-10-13 DIAGNOSIS — N938 Other specified abnormal uterine and vaginal bleeding: Secondary | ICD-10-CM

## 2017-10-13 MED ORDER — METRONIDAZOLE 500 MG PO TABS: 500.0000 mg | ORAL_TABLET | Freq: Two times a day (BID) | ORAL | 0 refills | Status: DC

## 2017-10-13 NOTE — Telephone Encounter (Signed)
Dr. Sabra Heck -please review, ok to proceed as scheduled? Please confirm diagnosis for IUD placement.   Notes recorded by Burnice Logan, RN on 10/13/2017 at 12:07 PM EST Spoke with patient, advised as seen below per Dr. Sabra Heck. RX for Flagyl to verified pharmacy. ETOH precautions reviewed. Contraception, tubal ligation. Patient states IUD is for cycle control. IUD placement scheduled for 10/25/17 at 3:30pm with Dr. Sabra Heck. Advised patient will review with Dr. Sabra Heck and return call with any additional recommendations, patient is agreeable. See telephone encounter dated 10/13/17 to review with provider. ------  Notes recorded by Megan Salon, MD on 10/13/2017 at 10:56 AM EST Please let pt know that her culture did not show actinomyces but did show bacteroides. Need to treat with flagyl 500mg  bid x 7 days. No ETOH. She is then going to return for IUD placement. Please confirm last SA as she needs to abstain for two weeks before placement due to low [place]. Please sent message back to me about this and when IUD placement scheduled.

## 2017-10-14 NOTE — Telephone Encounter (Signed)
Yes, ok to proceed with IUD placement as scheduled.  Thanks.

## 2017-10-15 NOTE — Telephone Encounter (Signed)
Order placed for Mirena IUD placement. Will close encounter.

## 2017-10-17 LAB — ANAEROBIC AND AEROBIC CULTURE

## 2017-10-20 ENCOUNTER — Telehealth: Payer: Self-pay | Admitting: *Deleted

## 2017-10-20 NOTE — Telephone Encounter (Signed)
Spoke with patient regarding benefit for a Mirena IUD device. Patient understood information presented.  Patient was not aware of benefits. Patient requested to cancel appointment on 10/25/17, states will consider other options if symptoms persist.    forwarding to Dr Sabra Heck for review

## 2017-10-20 NOTE — Telephone Encounter (Signed)
Dr. Sabra Heck- please review, any additional recommendations?   Notes recorded by Burnice Logan, RN on 10/20/2017 at 11:16 AM EST Spoke with patient, advised as seen below per Dr. Sabra Heck. Patient states her grandmother has been in the hospital and she has not completed flagyl for BV, has 3 days left. Advised patient to keep IUD placement as scheduled for 10/25/17 and complete flagyl. Will review with Dr. Sabra Heck when she return to the office on 12/27 and return call with any additional recommendations, patient verbalizes understanding and is agreeable. See telephone encounter dated 10/20/17 to review with provider. ------

## 2017-10-25 ENCOUNTER — Ambulatory Visit: Payer: Self-pay | Admitting: Obstetrics & Gynecology

## 2017-12-20 ENCOUNTER — Telehealth: Payer: Self-pay | Admitting: Obstetrics and Gynecology

## 2017-12-20 ENCOUNTER — Ambulatory Visit: Payer: 59 | Admitting: Obstetrics and Gynecology

## 2017-12-20 NOTE — Telephone Encounter (Signed)
Patient called and cancelled her AEX for today due to experiencing a heavy menstrual cycle, as reported. She rescheduled to 01/05/18. Routing to provider for FYI only.

## 2018-01-05 ENCOUNTER — Encounter: Payer: Self-pay | Admitting: Obstetrics and Gynecology

## 2018-01-05 ENCOUNTER — Other Ambulatory Visit (HOSPITAL_COMMUNITY)
Admission: RE | Admit: 2018-01-05 | Discharge: 2018-01-05 | Disposition: A | Discharge status: Home / Self Care | Payer: 59 | Source: Ambulatory Visit | Attending: Obstetrics and Gynecology | Admitting: Obstetrics and Gynecology

## 2018-01-05 ENCOUNTER — Ambulatory Visit (INDEPENDENT_AMBULATORY_CARE_PROVIDER_SITE_OTHER): Payer: 59 | Admitting: Obstetrics and Gynecology

## 2018-01-05 ENCOUNTER — Other Ambulatory Visit: Payer: Self-pay

## 2018-01-05 VITALS — BP 132/78 | HR 80 | Resp 16 | Ht 67.0 in | Wt 217.0 lb

## 2018-01-05 DIAGNOSIS — Z01419 Encounter for gynecological examination (general) (routine) without abnormal findings: Secondary | ICD-10-CM

## 2018-01-05 DIAGNOSIS — K921 Melena: Secondary | ICD-10-CM | POA: Diagnosis not present

## 2018-01-05 MED ORDER — NORETHINDRONE 0.35 MG PO TABS: 1.0000 | ORAL_TABLET | Freq: Every day | ORAL | 3 refills | Status: DC

## 2018-01-05 NOTE — Patient Instructions (Signed)

## 2018-01-05 NOTE — Progress Notes (Signed)
Opened in error

## 2018-01-05 NOTE — Progress Notes (Signed)
47 y.o. G57P0012 Married Caucasian female here for annual exam.    IUD removed with ultrasound guidance on 10/07/17 by Dr. Sabra Heck.  Had fibroids noted.  Culture done and bacteroides fragilis noted and not Actinomyces.  No new IUD was placed due to patient choice. Status post BTL.  [place] 5.7 on 10/06/17.  Had 2 menstrual cycles since IUD removed.  Heavy bleeding with clots and changing pad every 3 hours.  Having pain/bleeding with intercourse.  ROS -   Weight gain.   Skin itching on right breast. No breast pain.   Blood in stool.  Hx hemorrhoids.  She cancelled her appointment with Dr. Earlean Shawl last year.  Has benign positional vertigo.  Seeing ENT.   PCP: Wanda Plump  Patient's last menstrual period was 12/19/2017.           Sexually active: Yes.    The current method of family planning is tubal ligation.    Exercising: No.   Smoker:  yes  Health Maintenance: Pap: 12/16/16 ASCUS: Neg HR HPV; 12-11-15 Neg:Neg HR HPV History of abnormal Pap:  Yes, 10/2011 hx Colpo/LEEP procedure;between 2010 and 2013 colpo/cryotherapy to cervix MMG:  12/18/16 MM DIAG BREAST RIGHT/US - BIRADS 1 negative/density b Colonoscopy:  08/2016 polyps with Dr.Medoff;next due 08/2021(fam.hx colon ca in mom--deceased).   BMD:   n/a  Result  n/a TDaP:  2015 Gardasil:   n/a HIV: neg in pregnancy Hep C: Screening Labs:  Hb today: PCP    reports that she has been smoking cigarettes.  she has never used smokeless tobacco. She reports that she drinks about 3.0 oz of alcohol per week. She reports that she does not use drugs.  Past Medical History:  Diagnosis Date  . Abnormal Pap smear of cervix    2010-2014 abnormal pap every 3-6 months  ? Dysplasia  . Anxiety   . Depression   . DUB (dysfunctional uterine bleeding)   . Endometriosis   . Fibroid   . Hypertension   . Infertility, female   . Recurrent bacterial infection   . Vertigo     Past Surgical History:  Procedure Laterality Date  .  ABLATION ON ENDOMETRIOSIS  1995  . CERVICAL BIOPSY  W/ LOOP ELECTRODE EXCISION    . COLPOSCOPY     x 3  . DILATION AND CURETTAGE OF UTERUS  1993  . GYNECOLOGIC CRYOSURGERY    . INTRAUTERINE DEVICE (IUD) INSERTION  2014    (Mirena)  . TUBAL LIGATION     2010    Current Outpatient Medications  Medication Sig Dispense Refill  . hydrochlorothiazide (HYDRODIURIL) 25 MG tablet Take 25 mg by mouth daily.  12  . Ibuprofen (ADVIL) 200 MG CAPS Take 600 mg by mouth as needed (pain).     Marland Kitchen omeprazole (PRILOSEC) 40 MG capsule Take 40 mg by mouth daily.      No current facility-administered medications for this visit.     Family History  Problem Relation Age of Onset  . Cancer Mother 40       Dec.Stage IV colon ca 10/2013--age 57  . Heart disease Father   . Heart disease Paternal Grandfather   . Other Paternal Grandfather        Dec MVA  . Other Brother        Dec in a MVA age 68  . Alzheimer's disease Maternal Grandfather        Dec  . Other Maternal Grandfather   . Other Paternal Grandmother  Dec MVA  . Heart failure Paternal Aunt        Stage 4 colon cancer    ROS:  Pertinent items are noted in HPI.  Otherwise, a comprehensive ROS was negative.  Exam:   BP 132/78 (BP Location: Right Arm, Patient Position: Sitting, Cuff Size: Normal)   Pulse 80   Resp 16   Ht 5\' 7"  (1.702 m)   Wt 217 lb (98.4 kg)   LMP 12/19/2017   BMI 33.99 kg/m     General appearance: alert, cooperative and appears stated age Head: Normocephalic, without obvious abnormality, atraumatic Neck: no adenopathy, supple, symmetrical, trachea midline and thyroid normal to inspection and palpation Lungs: clear to auscultation bilaterally Breasts: normal appearance, no masses or tenderness, No nipple retraction or dimpling, No nipple discharge or bleeding, No axillary or supraclavicular adenopathy Heart: regular rate and rhythm Abdomen: soft, non-tender; no masses, no organomegaly Extremities: extremities  normal, atraumatic, no cyanosis or edema Skin: Skin color, texture, turgor normal. No rashes or lesions Lymph nodes: Cervical, supraclavicular, and axillary nodes normal. No abnormal inguinal nodes palpated Neurologic: Grossly normal  Pelvic: External genitalia:  no lesions              Urethra:  normal appearing urethra with no masses, tenderness or lesions              Bartholins and Skenes: normal                 Vagina: normal appearing vagina with normal color and discharge, no lesions              Cervix: no lesions              Pap taken: Yes.   Bimanual Exam:  Uterus:  normal size, contour, position, consistency, mobility, non-tender              Adnexa: no mass, fullness, tenderness              Rectal exam: Yes.  .  Confirms.              Anus:  normal sphincter tone, no lesions  Chaperone was present for exam.  Assessment:   Well woman visit with normal exam. Status post BTL.  Status post IUD removal.  Small fibroids. Menorrhagia. Hx LEEP and cryo to cervix.  FH of colon cancer.  Rectal bleeding.  Hx hemorrhoids. Smoker.  Plans to quit.   Plan: Mammogram screening discussed.  She will schedule. Recommended self breast awareness. Pap and HR HPV as above. Guidelines for Calcium, Vitamin D, regular exercise program including cardiovascular and weight bearing exercise. [place] x 12 months.  CBC and TSH. We talked about smoking cessation. Referral back to Dr. Earlean Shawl.   Follow up annually and prn.   After visit summary provided.

## 2018-01-06 LAB — CBC
Hematocrit: 41.7 % (ref 34.0–46.6)
Hemoglobin: 13.7 g/dL (ref 11.1–15.9)
MCH: 30.6 pg (ref 26.6–33.0)
MCHC: 32.9 g/dL (ref 31.5–35.7)
MCV: 93 fL (ref 79–97)
Platelets: 287 10*3/uL (ref 150–379)
RBC: 4.48 x10E6/uL (ref 3.77–5.28)
RDW: 13.1 % (ref 12.3–15.4)
WBC: 7.5 10*3/uL (ref 3.4–10.8)

## 2018-01-06 LAB — TSH: TSH: 1.55 u[IU]/mL (ref 0.450–4.500)

## 2018-01-07 LAB — CYTOLOGY - PAP
Diagnosis: NEGATIVE
HPV: NOT DETECTED

## 2018-02-18 ENCOUNTER — Other Ambulatory Visit: Payer: Self-pay | Admitting: Obstetrics and Gynecology

## 2018-02-18 DIAGNOSIS — Z1231 Encounter for screening mammogram for malignant neoplasm of breast: Secondary | ICD-10-CM

## 2018-03-15 ENCOUNTER — Ambulatory Visit
Admission: RE | Admit: 2018-03-15 | Discharge: 2018-03-15 | Disposition: A | Discharge status: Home / Self Care | Payer: 59 | Source: Ambulatory Visit | Attending: Obstetrics and Gynecology | Admitting: Obstetrics and Gynecology

## 2018-03-15 ENCOUNTER — Other Ambulatory Visit: Payer: Self-pay | Admitting: Obstetrics and Gynecology

## 2018-03-15 DIAGNOSIS — Z1231 Encounter for screening mammogram for malignant neoplasm of breast: Secondary | ICD-10-CM

## 2018-06-08 ENCOUNTER — Other Ambulatory Visit: Payer: Self-pay | Admitting: Internal Medicine

## 2018-06-08 ENCOUNTER — Ambulatory Visit
Admission: RE | Admit: 2018-06-08 | Discharge: 2018-06-08 | Disposition: A | Discharge status: Home / Self Care | Payer: 59 | Source: Ambulatory Visit | Attending: Internal Medicine | Admitting: Internal Medicine

## 2018-06-08 DIAGNOSIS — M546 Pain in thoracic spine: Secondary | ICD-10-CM

## 2018-06-08 DIAGNOSIS — R0602 Shortness of breath: Secondary | ICD-10-CM

## 2018-06-26 HISTORY — PX: COLONOSCOPY: SHX174

## 2018-06-26 HISTORY — PX: UPPER GI ENDOSCOPY: SHX6162

## 2018-06-29 ENCOUNTER — Encounter: Payer: Self-pay | Admitting: Cardiology

## 2018-06-29 ENCOUNTER — Ambulatory Visit (INDEPENDENT_AMBULATORY_CARE_PROVIDER_SITE_OTHER): Payer: 59 | Admitting: Cardiology

## 2018-06-29 VITALS — BP 130/80 | HR 96 | Ht 67.0 in | Wt 227.8 lb

## 2018-06-29 DIAGNOSIS — H93A2 Pulsatile tinnitus, left ear: Secondary | ICD-10-CM | POA: Diagnosis not present

## 2018-06-29 DIAGNOSIS — I10 Essential (primary) hypertension: Secondary | ICD-10-CM | POA: Diagnosis not present

## 2018-06-29 DIAGNOSIS — Z720 Tobacco use: Secondary | ICD-10-CM | POA: Diagnosis not present

## 2018-06-29 NOTE — Patient Instructions (Addendum)
Medication Instructions:  Your physician recommends that you continue on your current medications as directed. Please refer to the Current Medication list given to you today.   Labwork: None ordered  Testing/Procedures: None ordered  Follow-Up: Your physician wants you to follow-up in: 1 year with Dr. Acie Fredrickson. You will receive a reminder letter in the mail two months in advance. If you don't receive a letter, please call our office to schedule the follow-up appointment.   Any Other Special Instructions Will Be Listed Below (If Applicable).     If you need a refill on your cardiac medications before your next appointment, please call your pharmacy.    DASH Eating Plan DASH stands for "Dietary Approaches to Stop Hypertension." The DASH eating plan is a healthy eating plan that has been shown to reduce high blood pressure (hypertension). It may also reduce your risk for type 2 diabetes, heart disease, and stroke. The DASH eating plan may also help with weight loss. What are tips for following this plan? General guidelines  Avoid eating more than 2,300 mg (milligrams) of salt (sodium) a day. If you have hypertension, you may need to reduce your sodium intake to 1,500 mg a day.  Limit alcohol intake to no more than 1 drink a day for nonpregnant women and 2 drinks a day for men. One drink equals 12 oz of beer, 5 oz of wine, or 1 oz of hard liquor.  Work with your health care provider to maintain a healthy body weight or to lose weight. Ask what an ideal weight is for you.  Get at least 30 minutes of exercise that causes your heart to beat faster (aerobic exercise) most days of the week. Activities may include walking, swimming, or biking.  Work with your health care provider or diet and nutrition specialist (dietitian) to adjust your eating plan to your individual calorie needs. Reading food labels  Check food labels for the amount of sodium per serving. Choose foods with less than 5  percent of the Daily Value of sodium. Generally, foods with less than 300 mg of sodium per serving fit into this eating plan.  To find whole grains, look for the word "whole" as the first word in the ingredient list. Shopping  Buy products labeled as "low-sodium" or "no salt added."  Buy fresh foods. Avoid canned foods and premade or frozen meals. Cooking  Avoid adding salt when cooking. Use salt-free seasonings or herbs instead of table salt or sea salt. Check with your health care provider or pharmacist before using salt substitutes.  Do not fry foods. Cook foods using healthy methods such as baking, boiling, grilling, and broiling instead.  Cook with heart-healthy oils, such as olive, canola, soybean, or sunflower oil. Meal planning   Eat a balanced diet that includes: ? 5 or more servings of fruits and vegetables each day. At each meal, try to fill half of your plate with fruits and vegetables. ? Up to 6-8 servings of whole grains each day. ? Less than 6 oz of lean meat, poultry, or fish each day. A 3-oz serving of meat is about the same size as a deck of cards. One egg equals 1 oz. ? 2 servings of low-fat dairy each day. ? A serving of nuts, seeds, or beans 5 times each week. ? Heart-healthy fats. Healthy fats called Omega-3 fatty acids are found in foods such as flaxseeds and coldwater fish, like sardines, salmon, and mackerel.  Limit how much you eat of the following: ?  Canned or prepackaged foods. ? Food that is high in trans fat, such as fried foods. ? Food that is high in saturated fat, such as fatty meat. ? Sweets, desserts, sugary drinks, and other foods with added sugar. ? Full-fat dairy products.  Do not salt foods before eating.  Try to eat at least 2 vegetarian meals each week.  Eat more home-cooked food and less restaurant, buffet, and fast food.  When eating at a restaurant, ask that your food be prepared with less salt or no salt, if possible. What foods are  recommended? The items listed may not be a complete list. Talk with your dietitian about what dietary choices are best for you. Grains Whole-grain or whole-wheat bread. Whole-grain or whole-wheat pasta. Brown rice. [place]. Bulgur. Whole-grain and low-sodium cereals. Pita bread. Low-fat, low-sodium crackers. Whole-wheat flour tortillas. Vegetables Fresh or frozen vegetables (raw, steamed, roasted, or grilled). Low-sodium or reduced-sodium tomato and vegetable juice. Low-sodium or reduced-sodium tomato sauce and tomato paste. Low-sodium or reduced-sodium canned vegetables. Fruits All fresh, dried, or frozen fruit. Canned fruit in natural juice (without added sugar). Meat and other protein foods Skinless chicken or [place]. Ground chicken or [place]. Pork with fat trimmed off. Fish and seafood. Egg whites. Dried beans, peas, or lentils. Unsalted nuts, nut butters, and seeds. Unsalted canned beans. Lean cuts of beef with fat trimmed off. Low-sodium, lean deli meat. Dairy Low-fat (1%) or fat-free (skim) milk. Fat-free, low-fat, or reduced-fat cheeses. Nonfat, low-sodium ricotta or cottage cheese. Low-fat or nonfat yogurt. Low-fat, low-sodium cheese. Fats and oils Soft margarine without trans fats. Vegetable oil. Low-fat, reduced-fat, or light mayonnaise and salad dressings (reduced-sodium). Canola, safflower, olive, soybean, and sunflower oils. Avocado. Seasoning and other foods Herbs. Spices. Seasoning mixes without salt. Unsalted popcorn and pretzels. Fat-free sweets. What foods are not recommended? The items listed may not be a complete list. Talk with your dietitian about what dietary choices are best for you. Grains Baked goods made with fat, such as croissants, muffins, or some breads. Dry pasta or rice meal packs. Vegetables Creamed or fried vegetables. Vegetables in a cheese sauce. Regular canned vegetables (not low-sodium or reduced-sodium). Regular canned tomato sauce and paste (not  low-sodium or reduced-sodium). Regular tomato and vegetable juice (not low-sodium or reduced-sodium). [name] Fava. Olives. Fruits Canned fruit in a light or heavy syrup. Fried fruit. Fruit in cream or butter sauce. Meat and other protein foods Fatty cuts of meat. Ribs. Fried meat. Berniece Salines. Sausage. Bologna and other processed lunch meats. Salami. Fatback. Hotdogs. Bratwurst. Salted nuts and seeds. Canned beans with added salt. Canned or smoked fish. Whole eggs or egg yolks. Chicken or [place] with skin. Dairy Whole or 2% milk, cream, and half-and-half. Whole or full-fat cream cheese. Whole-fat or sweetened yogurt. Full-fat cheese. Nondairy creamers. Whipped toppings. Processed cheese and cheese spreads. Fats and oils Butter. Stick margarine. Lard. Shortening. Ghee. Bacon fat. Tropical oils, such as coconut, palm kernel, or palm oil. Seasoning and other foods Salted popcorn and pretzels. Onion salt, garlic salt, seasoned salt, table salt, and sea salt. Worcestershire sauce. Tartar sauce. Barbecue sauce. Teriyaki sauce. Soy sauce, including reduced-sodium. Steak sauce. Canned and packaged gravies. Fish sauce. Oyster sauce. Cocktail sauce. Horseradish that you find on the shelf. Ketchup. Mustard. Meat flavorings and tenderizers. Bouillon cubes. Hot sauce and Tabasco sauce. Premade or packaged marinades. Premade or packaged taco seasonings. Relishes. Regular salad dressings. Where to find more information:  National Heart, Lung, and [place]: https://wilson-eaton.com/  American Heart Association: www.heart.org Summary  The DASH eating plan is a healthy eating plan that has been shown to reduce high blood pressure (hypertension). It may also reduce your risk for type 2 diabetes, heart disease, and stroke.  With the DASH eating plan, you should limit salt (sodium) intake to 2,300 mg a day. If you have hypertension, you may need to reduce your sodium intake to 1,500 mg a day.  When on the DASH eating plan,  aim to eat more fresh fruits and vegetables, whole grains, lean proteins, low-fat dairy, and heart-healthy fats.  Work with your health care provider or diet and nutrition specialist (dietitian) to adjust your eating plan to your individual calorie needs. This information is not intended to replace advice given to you by your health care provider. Make sure you discuss any questions you have with your health care provider. Document Released: 10/01/2011 Document Revised: 10/05/2016 Document Reviewed: 10/05/2016 Elsevier Interactive Patient Education  [employer].

## 2018-06-29 NOTE — Progress Notes (Signed)
Cardiology Office Note:    Date:  06/29/2018   ID:  Andrea [name] [place], [name] August 03, 1971, MRN [id number]  PCP:  Jilda Panda, MD  Cardiologist:  Mertie Moores, MD  Referring MD: Jilda Panda, MD   Chief Complaint  Patient presents with  . Dizziness    hearing heart beat in ear    History of Present Illness:    Andrea [name] is a 47 y.o. female with a past medical history significant for BPPV, HTN, depression and anxiety.  He was previously evaluated for chest tightness by Dr. Acie Fredrickson and found to have a calcium score of 0 with normal coronary arteries by 07/23/2017.  She also had normal LV systolic function with EF 60-65% and grade 1 diastolic dysfunction by echo on 06/25/2017.  She is here today for hearing her heart beat in her left ear. At first it was swishing, but then it was with her heart beat. It occurs randomly, not constantly. Often with activity or at night, sometimes when she is sitting down after having a cigarette. Currently has some mild "off balance" but no significant dizziness, mostly at night when turning over in bed.   She has seen ENT for BPPV and a hearing test was done, unknown results.  She reports that she is not longer taking any BP meds. BP has been better since she stopped phentermine. She was advised by Dr. Abbott Pao to have an MRI but she wanted to check here first.   She works from home with [employer] and sits for most of the day. She continues to smoke and does not exercise. She admits to being "a saltaholic", putting extra salt on all of her food. She also likes sweets.    Past Medical History:  Diagnosis Date  . Abnormal Pap smear of cervix    2010-2014 abnormal pap every 3-6 months  ? Dysplasia  . Anxiety   . Depression   . DUB (dysfunctional uterine bleeding)   . Endometriosis   . Fibroid   . Hypertension   . Infertility, female   . Recurrent bacterial infection   . Vertigo     Past Surgical History:  Procedure Laterality Date    . ABLATION ON ENDOMETRIOSIS  1995  . CERVICAL BIOPSY  W/ LOOP ELECTRODE EXCISION    . COLPOSCOPY     x 3  . DILATION AND CURETTAGE OF UTERUS  1993  . GYNECOLOGIC CRYOSURGERY    . INTRAUTERINE DEVICE (IUD) INSERTION  2014    (Mirena)  . TUBAL LIGATION     2010    Current Medications: Current Meds  Medication Sig  . hydrochlorothiazide (HYDRODIURIL) 25 MG tablet Take 25 mg by mouth daily.  . Ibuprofen (ADVIL) 200 MG CAPS Take 600 mg by mouth as needed (pain).   . norethindrone (MICRONOR,CAMILA,ERRIN) 0.35 MG tablet Take 1 tablet (0.35 mg total) by mouth daily.  Marland Kitchen omeprazole (PRILOSEC) 40 MG capsule Take 40 mg by mouth daily.      Allergies:   Patient has no known allergies.   Social History   Socioeconomic History  . Marital status: Married    Spouse name: Not on file  . Number of children: Not on file  . Years of education: Not on file  . Highest education level: Not on file  Occupational History  . Not on file  Social Needs  . Financial resource strain: Not on file  . Food insecurity:    Worry: Not on file    Inability:  Not on file  . Transportation needs:    Medical: Not on file    Non-medical: Not on file  Tobacco Use  . Smoking status: Current Every Day Smoker    Types: Cigarettes  . Smokeless tobacco: Never Used  . Tobacco comment: smokes 1pack per week(2-3 cigs per day)  Substance and Sexual Activity  . Alcohol use: Yes    Alcohol/week: 5.0 standard drinks    Types: 5 Glasses of wine per week    Comment: 6-7 drinks a week (alcohol)  Red Bull and Vodka or red wine  . Drug use: No  . Sexual activity: Yes    Partners: Male    Birth control/protection: Surgical    Comment: BTL  Lifestyle  . Physical activity:    Days per week: Not on file    Minutes per session: Not on file  . Stress: Not on file  Relationships  . Social connections:    Talks on phone: Not on file    Gets together: Not on file    Attends religious service: Not on file    Active  member of club or organization: Not on file    Attends meetings of clubs or organizations: Not on file    Relationship status: Not on file  Other Topics Concern  . Not on file  Social History Narrative  . Not on file     Family History: The patient's family history includes Alzheimer's disease in her maternal grandfather; Cancer (age of onset: 50) in her mother; Heart disease in her father and paternal grandfather; Heart failure in her paternal aunt; Other in her brother, maternal grandfather, paternal grandfather, and paternal grandmother. ROS:   Please see the history of present illness.     All other systems reviewed and are negative.  EKGs/Labs/Other Studies Reviewed:    The following studies were reviewed today:  Echocardiogram 06/25/2017 Study Conclusions - Left ventricle: The cavity size was normal. There was mild   concentric hypertrophy. Systolic function was normal. The   estimated ejection fraction was in the range of 60% to 65%. Wall   motion was normal; there were no regional wall motion   abnormalities. Doppler parameters are consistent with abnormal   left ventricular relaxation (grade 1 diastolic dysfunction).   Doppler parameters are consistent with indeterminate ventricular   filling pressure. - Aortic valve: Transvalvular velocity was within the normal range.   There was no stenosis. There was trivial regurgitation. - Mitral valve: Transvalvular velocity was within the normal range.   There was no evidence for stenosis. There was no regurgitation. - Right ventricle: The cavity size was normal. Wall thickness was   normal. Systolic function was normal. - Atrial septum: No defect or patent foramen ovale was identified   by color flow Doppler. - Tricuspid valve: There was trivial regurgitation. - Pulmonary arteries: Systolic pressure was within the normal   range. PA peak pressure: 22 mm Hg (S).  EKG:  EKG is ordered today.  The ekg ordered today demonstrates  NSR 78 bpm with no ischemic changes.   Recent Labs: 01/05/2018: Hemoglobin 13.7; Platelets 287; TSH 1.550   Recent Lipid Panel No results found for: CHOL, TRIG, HDL, CHOLHDL, VLDL, LDLCALC, LDLDIRECT  Physical Exam:    VS:  BP 130/80   Pulse 96   Ht 5\' 7"  (1.702 m)   Wt 227 lb 12.8 oz (103.3 kg)   SpO2 99%   BMI 35.68 kg/m     Wt Readings from Last  3 Encounters:  06/29/18 227 lb 12.8 oz (103.3 kg)  01/05/18 217 lb (98.4 kg)  10/07/17 218 lb (98.9 kg)     Physical Exam  Constitutional: She is oriented to person, place, and time. She appears well-developed and well-nourished. No distress.  HENT:  Head: Normocephalic and atraumatic.  Neck: Normal range of motion. Neck supple. No JVD present. Carotid bruit is not present.  Cardiovascular: Normal rate, regular rhythm, normal heart sounds and intact distal pulses. Exam reveals no gallop and no friction rub.  No murmur heard. Pulmonary/Chest: Effort normal and breath sounds normal. No respiratory distress. She has no wheezes. She has no rales.  Abdominal: Soft. Bowel sounds are normal. She exhibits no distension.  Musculoskeletal: Normal range of motion. She exhibits no edema or deformity.  Neurological: She is alert and oriented to person, place, and time.  Skin: Skin is warm and dry.  Psychiatric: She has a normal mood and affect. Her behavior is normal. Judgment and thought content normal.  Vitals reviewed.    ASSESSMENT:    1. Essential hypertension   2. Pulsatile tinnitus of left ear   3. Tobacco abuse    PLAN:    In order of problems listed above:  Pulsatile tinnitus: Pt with complaints of intermittently hearing a swishing in her left ear with her heart beat. This is not constant. I do not hear a carotid bruit. She has been treated for BPPV in the past but states that she was told that is not her problem. I reassured her that this is not consistent with carotid artery occlusion. I agree with Dr. Mellody Drown that imaging  would rule out a tumor or aneurysm. If all other things rule out and she continues to have symptoms, I will order carotid dopplers. She agrees and will call me if she needs this ordered.   Hypertension: She has stopped the HCTZ as she says that she no longer needs it as her BP has been normal since stopping the phentermine. I advised her to continue to watch her BP with a goal of <130/80. Also reduce her sodium intake and exercise.   Tobacco abuse: Continues to smoke and does not feel ready to quit. We discussed the negative effects of smoking on health especially vascular health.   We discussed lifestyle and working on reducing her salt intake, improving her food choices, smoking cessation, and exercising. We made a plan for her to get up and walk around the outside of her house several times during her work day. It think that these lifestyle changes would be     Medication Adjustments/Labs and Tests Ordered: Current medicines are reviewed at length with the patient today.  Concerns regarding medicines are outlined above. Labs and tests ordered and medication changes are outlined in the patient instructions below:  Patient Instructions  Medication Instructions:  Your physician recommends that you continue on your current medications as directed. Please refer to the Current Medication list given to you today.   Labwork: None ordered  Testing/Procedures: None ordered  Follow-Up: Your physician wants you to follow-up in: 1 year with Dr. Acie Fredrickson. You will receive a reminder letter in the mail two months in advance. If you don't receive a letter, please call our office to schedule the follow-up appointment.   Any Other Special Instructions Will Be Listed Below (If Applicable).     If you need a refill on your cardiac medications before your next appointment, please call your pharmacy.    DASH Eating Plan  DASH stands for "Dietary Approaches to Stop Hypertension." The DASH eating plan  is a healthy eating plan that has been shown to reduce high blood pressure (hypertension). It may also reduce your risk for type 2 diabetes, heart disease, and stroke. The DASH eating plan may also help with weight loss. What are tips for following this plan? General guidelines  Avoid eating more than 2,300 mg (milligrams) of salt (sodium) a day. If you have hypertension, you may need to reduce your sodium intake to 1,500 mg a day.  Limit alcohol intake to no more than 1 drink a day for nonpregnant women and 2 drinks a day for men. One drink equals 12 oz of beer, 5 oz of wine, or 1 oz of hard liquor.  Work with your health care provider to maintain a healthy body weight or to lose weight. Ask what an ideal weight is for you.  Get at least 30 minutes of exercise that causes your heart to beat faster (aerobic exercise) most days of the week. Activities may include walking, swimming, or biking.  Work with your health care provider or diet and nutrition specialist (dietitian) to adjust your eating plan to your individual calorie needs. Reading food labels  Check food labels for the amount of sodium per serving. Choose foods with less than 5 percent of the Daily Value of sodium. Generally, foods with less than 300 mg of sodium per serving fit into this eating plan.  To find whole grains, look for the word "whole" as the first word in the ingredient list. Shopping  Buy products labeled as "low-sodium" or "no salt added."  Buy fresh foods. Avoid canned foods and premade or frozen meals. Cooking  Avoid adding salt when cooking. Use salt-free seasonings or herbs instead of table salt or sea salt. Check with your health care provider or pharmacist before using salt substitutes.  Do not fry foods. Cook foods using healthy methods such as baking, boiling, grilling, and broiling instead.  Cook with heart-healthy oils, such as olive, canola, soybean, or sunflower oil. Meal planning   Eat a  balanced diet that includes: ? 5 or more servings of fruits and vegetables each day. At each meal, try to fill half of your plate with fruits and vegetables. ? Up to 6-8 servings of whole grains each day. ? Less than 6 oz of lean meat, poultry, or fish each day. A 3-oz serving of meat is about the same size as a deck of cards. One egg equals 1 oz. ? 2 servings of low-fat dairy each day. ? A serving of nuts, seeds, or beans 5 times each week. ? Heart-healthy fats. Healthy fats called Omega-3 fatty acids are found in foods such as flaxseeds and coldwater fish, like sardines, salmon, and mackerel.  Limit how much you eat of the following: ? Canned or prepackaged foods. ? Food that is high in trans fat, such as fried foods. ? Food that is high in saturated fat, such as fatty meat. ? Sweets, desserts, sugary drinks, and other foods with added sugar. ? Full-fat dairy products.  Do not salt foods before eating.  Try to eat at least 2 vegetarian meals each week.  Eat more home-cooked food and less restaurant, buffet, and fast food.  When eating at a restaurant, ask that your food be prepared with less salt or no salt, if possible. What foods are recommended? The items listed may not be a complete list. Talk with your dietitian about what dietary choices  are best for you. Grains Whole-grain or whole-wheat bread. Whole-grain or whole-wheat pasta. Brown rice. [place]row. Bulgur. Whole-grain and low-sodium cereals. Pita bread. Low-fat, low-sodium crackers. Whole-wheat flour tortillas. Vegetables Fresh or frozen vegetables (raw, steamed, roasted, or grilled). Low-sodium or reduced-sodium tomato and vegetable juice. Low-sodium or reduced-sodium tomato sauce and tomato paste. Low-sodium or reduced-sodium canned vegetables. Fruits All fresh, dried, or frozen fruit. Canned fruit in natural juice (without added sugar). Meat and other protein foods Skinless chicken or [place]. Ground chicken or  [place]. Pork with fat trimmed off. Fish and seafood. Egg whites. Dried beans, peas, or lentils. Unsalted nuts, nut butters, and seeds. Unsalted canned beans. Lean cuts of beef with fat trimmed off. Low-sodium, lean deli meat. Dairy Low-fat (1%) or fat-free (skim) milk. Fat-free, low-fat, or reduced-fat cheeses. Nonfat, low-sodium ricotta or cottage cheese. Low-fat or nonfat yogurt. Low-fat, low-sodium cheese. Fats and oils Soft margarine without trans fats. Vegetable oil. Low-fat, reduced-fat, or light mayonnaise and salad dressings (reduced-sodium). Canola, safflower, olive, soybean, and sunflower oils. Avocado. Seasoning and other foods Herbs. Spices. Seasoning mixes without salt. Unsalted popcorn and pretzels. Fat-free sweets. What foods are not recommended? The items listed may not be a complete list. Talk with your dietitian about what dietary choices are best for you. Grains Baked goods made with fat, such as croissants, muffins, or some breads. Dry pasta or rice meal packs. Vegetables Creamed or fried vegetables. Vegetables in a cheese sauce. Regular canned vegetables (not low-sodium or reduced-sodium). Regular canned tomato sauce and paste (not low-sodium or reduced-sodium). Regular tomato and vegetable juice (not low-sodium or reduced-sodium). [name] Fava. Olives. Fruits Canned fruit in a light or heavy syrup. Fried fruit. Fruit in cream or butter sauce. Meat and other protein foods Fatty cuts of meat. Ribs. Fried meat. Berniece Salines. Sausage. Bologna and other processed lunch meats. Salami. Fatback. Hotdogs. Bratwurst. Salted nuts and seeds. Canned beans with added salt. Canned or smoked fish. Whole eggs or egg yolks. Chicken or [place] with skin. Dairy Whole or 2% milk, cream, and half-and-half. Whole or full-fat cream cheese. Whole-fat or sweetened yogurt. Full-fat cheese. Nondairy creamers. Whipped toppings. Processed cheese and cheese spreads. Fats and oils Butter. Stick margarine. Lard.  Shortening. Ghee. Bacon fat. Tropical oils, such as coconut, palm kernel, or palm oil. Seasoning and other foods Salted popcorn and pretzels. Onion salt, garlic salt, seasoned salt, table salt, and sea salt. Worcestershire sauce. Tartar sauce. Barbecue sauce. Teriyaki sauce. Soy sauce, including reduced-sodium. Steak sauce. Canned and packaged gravies. Fish sauce. Oyster sauce. Cocktail sauce. Horseradish that you find on the shelf. Ketchup. Mustard. Meat flavorings and tenderizers. Bouillon cubes. Hot sauce and Tabasco sauce. Premade or packaged marinades. Premade or packaged taco seasonings. Relishes. Regular salad dressings. Where to find more information:  National Heart, Lung, and [place]: https://wilson-eaton.com/  American Heart Association: www.heart.org Summary  The DASH eating plan is a healthy eating plan that has been shown to reduce high blood pressure (hypertension). It may also reduce your risk for type 2 diabetes, heart disease, and stroke.  With the DASH eating plan, you should limit salt (sodium) intake to 2,300 mg a day. If you have hypertension, you may need to reduce your sodium intake to 1,500 mg a day.  When on the DASH eating plan, aim to eat more fresh fruits and vegetables, whole grains, lean proteins, low-fat dairy, and heart-healthy fats.  Work with your health care provider or diet and nutrition specialist (dietitian) to adjust your eating plan to your individual calorie needs.  This information is not intended to replace advice given to you by your health care provider. Make sure you discuss any questions you have with your health care provider. Document Released: 10/01/2011 Document Revised: 10/05/2016 Document Reviewed: 10/05/2016 Elsevier Interactive Patient Education  2018 [place], Daune Perch, NP  06/29/2018 6:15 PM    [facility]

## 2018-07-05 ENCOUNTER — Other Ambulatory Visit: Payer: Self-pay | Admitting: Internal Medicine

## 2018-07-05 DIAGNOSIS — R42 Dizziness and giddiness: Secondary | ICD-10-CM

## 2018-07-05 DIAGNOSIS — H93A2 Pulsatile tinnitus, left ear: Secondary | ICD-10-CM

## 2018-07-06 ENCOUNTER — Ambulatory Visit
Admission: RE | Admit: 2018-07-06 | Discharge: 2018-07-06 | Disposition: A | Discharge status: Home / Self Care | Payer: 59 | Source: Ambulatory Visit | Attending: Internal Medicine | Admitting: Internal Medicine

## 2018-07-06 DIAGNOSIS — R42 Dizziness and giddiness: Secondary | ICD-10-CM

## 2018-07-06 DIAGNOSIS — H93A2 Pulsatile tinnitus, left ear: Secondary | ICD-10-CM

## 2018-07-06 MED ORDER — GADOBENATE DIMEGLUMINE 529 MG/ML IV SOLN
20.0000 mL | Freq: Once | INTRAVENOUS | Status: AC | PRN
  Administered 2018-07-06: 20 mL via INTRAVENOUS

## 2018-08-11 ENCOUNTER — Telehealth: Payer: Self-pay | Admitting: Obstetrics and Gynecology

## 2018-08-11 NOTE — Telephone Encounter (Signed)
Spoke with patient. Patient reports she followed up with GI for anemia that is followed by her PCP, had endoscopy and colonoscopy on 12/23/17. Patient reports intermittent dizziness, "has seen ENT for this and had a brain MRI". SOB with activity. Prickling in arms and legs. Fatigue. LMP greater than 30 days ago, unsure of exact date. Never started POP. Menses are longer and heavier. Not taking iron daily. Increased pain with intercourse.   Patient is scheduled for f/u with her PCP on 08/16/18, requesting to f/u with Dr. Quincy Simmonds. Last PUS 10/07/17 with IUD removal.   Advised I will review with Dr. Quincy Simmonds and return call to schedule. Patient is agreeable.   Dr. Quincy Simmonds -please review, would you like to schedule as OV or PUS?

## 2018-08-11 NOTE — Telephone Encounter (Signed)
Patient was diagnosed with anemia by her pcp. Patient states that she had a "colonoscopy and endoscopy and nothing was found'. She would like to follow up with Dr.Silva.

## 2018-08-12 NOTE — Telephone Encounter (Signed)
Spoke with patient, advised per Dr. Quincy Simmonds. OV scheduled for 08/23/18 at 11:30am. Patient verbalizes understanding and is agreeable.   Encounter closed.

## 2018-08-12 NOTE — Telephone Encounter (Signed)
Please schedule office visit with me to start. She had a pelvic US in December 2018 and fibroids were noted.

## 2018-08-22 NOTE — Progress Notes (Signed)
GYNECOLOGY  VISIT   HPI: 47 y.o.   Married  Caucasian  female   [phone] with Patient's last menstrual period was 07/04/2018 (exact date).   here for new diagnosis of anemia. Patient states had negative colonoscopy/endoscopy 06/2018 with Dr. Earlean Shawl.  Has hemorrhoids, but otherwise normal per patient.  She has irregular/heavy menses and wants to discuss options. She was taking iron tablets but has recently stopped. Complains of fatigue.    Menses are monthly, may skip a month. Heavy, heavy, heavy per patient.  Does use super plus tampon and almost a diaper.  Changes about every hour.  Menses last 5 - 7 days and heavy the whole time. Cramping is not that bad.  She does feel like a knife like pain at times.  Does not need to take any pain medication.   She has some pain with intercourse.   States hx of endometriosis.  Does have some back pain with a bowel movement that is a knife like pain.   Hgb 9.9 and ferritin 6.3 on 08/16/18.  TSH 1.44 on 08/16/18.  PCP treating anemia.   IUD removed with ultrasound guidance 10/07/17 by Dr. Sabra Heck.  Has fibroids.  No actinomyces noted.   I gave her an rx for Micronor on 01/05/18 at her annual exam.  She did not take this.  She is very anxious about taking medication and having a heart attack.   UPT: Neg  GYNECOLOGIC HISTORY: Patient's last menstrual period was 07/04/2018 (exact date). Contraception:  tubal Menopausal hormone therapy:  none Last mammogram: 03-15-18 Neg/density B/BiRads1 Last pap smear:   01-05-18 Neg:Neg HR HPV,                                12-16-16 ASCUS:Neg HR HPV OB History    Gravida  3   Para  2   Term      Preterm      AB  1   Living  2     SAB  1   TAB      Ectopic      Multiple      Live Births                 Patient Active Problem List   Diagnosis Date Noted  . Vertigo 09/15/2017    Past Medical History:  Diagnosis Date  . Abnormal Pap smear of cervix    2010-2014 abnormal pap every  3-6 months  ? Dysplasia  . Anemia   . Anxiety   . Depression   . DUB (dysfunctional uterine bleeding)   . Endometriosis   . Fibroid   . Hypertension   . Infertility, female   . Recurrent bacterial infection   . Vertigo     Past Surgical History:  Procedure Laterality Date  . ABLATION ON ENDOMETRIOSIS  1995  . CERVICAL BIOPSY  W/ LOOP ELECTRODE EXCISION    . COLPOSCOPY     x 3  . DILATION AND CURETTAGE OF UTERUS  1993  . GYNECOLOGIC CRYOSURGERY    . INTRAUTERINE DEVICE (IUD) INSERTION  2014    (Mirena)  . TUBAL LIGATION     2010    Current Outpatient Medications  Medication Sig Dispense Refill  . omeprazole (PRILOSEC) 40 MG capsule Take 40 mg by mouth daily.      No current facility-administered medications for this visit.      ALLERGIES: Patient has no known  allergies.  Family History  Problem Relation Age of Onset  . Cancer Mother 61       Dec.Stage IV colon ca 10/2013--age 74  . Heart disease Father   . Heart disease Paternal Grandfather   . Other Paternal Grandfather        Dec MVA  . Other Brother        Dec in a MVA age 61  . Alzheimer's disease Maternal Grandfather        Dec  . Other Maternal Grandfather   . Other Paternal Grandmother        Dec MVA  . Heart failure Paternal Aunt        Stage 4 colon cancer    Social History   Socioeconomic History  . Marital status: Married    Spouse name: Not on file  . Number of children: Not on file  . Years of education: Not on file  . Highest education level: Not on file  Occupational History  . Not on file  Social Needs  . Financial resource strain: Not on file  . Food insecurity:    Worry: Not on file    Inability: Not on file  . Transportation needs:    Medical: Not on file    Non-medical: Not on file  Tobacco Use  . Smoking status: Current Every Day Smoker    Types: Cigarettes  . Smokeless tobacco: Never Used  . Tobacco comment: smokes 1pack per week(2-3 cigs per day)  Substance and Sexual  Activity  . Alcohol use: Yes    Alcohol/week: 5.0 standard drinks    Types: 5 Glasses of wine per week    Comment: 6-7 drinks a week (alcohol)  Red Bull and Vodka or red wine  . Drug use: No  . Sexual activity: Yes    Partners: Male    Birth control/protection: Surgical    Comment: BTL  Lifestyle  . Physical activity:    Days per week: Not on file    Minutes per session: Not on file  . Stress: Not on file  Relationships  . Social connections:    Talks on phone: Not on file    Gets together: Not on file    Attends religious service: Not on file    Active member of club or organization: Not on file    Attends meetings of clubs or organizations: Not on file    Relationship status: Not on file  . Intimate partner violence:    Fear of current or ex partner: Not on file    Emotionally abused: Not on file    Physically abused: Not on file    Forced sexual activity: Not on file  Other Topics Concern  . Not on file  Social History Narrative  . Not on file    Review of Systems  All other systems reviewed and are negative.   PHYSICAL EXAMINATION:    BP 140/78 (BP Location: Right Arm, Patient Position: Sitting, Cuff Size: Large)   Ht 5\' 7"  (1.702 m)   Wt 226 lb (102.5 kg)   LMP 07/04/2018 (Exact Date)   BMI 35.40 kg/m     General appearance: alert, cooperative and appears stated age  Pelvic: External genitalia:  no lesions              Urethra:  normal appearing urethra with no masses, tenderness or lesions              Bartholins and Skenes: normal  Vagina: normal appearing vagina with normal color and discharge, no lesions              Cervix: no lesions                Bimanual Exam:  Uterus:  normal size, contour, position, consistency, mobility, non-tender              Adnexa: no mass, fullness, tenderness               Chaperone was present for exam.  ASSESSMENT  Menorrhagia.  Fibroids.  Pelvic pain and dyspareunia. Anemia.  Hx endometriosis.   FH of cardiovascular disease.  Anxiety. Smoker.   PLAN  We reviewed fibroids and pelvic pain/endometriosis. Treatment options may include progesterone only contraceptives including Mirena IUD, endometrial ablation or uterine artery embolization which both will not directly treat pain, Depo Lupron, and laparoscopic hysterectomy with bilateral salpingectomy and possible bilateral oophorectomy if abnormal.  Risks and benefits of these treatments reviewed.  Written information on hysterectomy and Depo Lupron.  She will consider her options ad contact me back.  FU for annual exam and prn.     An After Visit Summary was printed and given to the patient.  __40____ minutes face to face time of which over 50% was spent in counseling.

## 2018-08-23 ENCOUNTER — Ambulatory Visit: Payer: Self-pay | Admitting: Obstetrics and Gynecology

## 2018-08-24 ENCOUNTER — Other Ambulatory Visit: Payer: Self-pay

## 2018-08-24 ENCOUNTER — Ambulatory Visit (INDEPENDENT_AMBULATORY_CARE_PROVIDER_SITE_OTHER): Payer: 59 | Admitting: Obstetrics and Gynecology

## 2018-08-24 ENCOUNTER — Encounter: Payer: Self-pay | Admitting: Obstetrics and Gynecology

## 2018-08-24 VITALS — BP 140/78 | Ht 67.0 in | Wt 226.0 lb

## 2018-08-24 DIAGNOSIS — N926 Irregular menstruation, unspecified: Secondary | ICD-10-CM

## 2018-08-24 DIAGNOSIS — R102 Pelvic and perineal pain: Secondary | ICD-10-CM | POA: Diagnosis not present

## 2018-08-24 DIAGNOSIS — N921 Excessive and frequent menstruation with irregular cycle: Secondary | ICD-10-CM | POA: Diagnosis not present

## 2018-08-24 DIAGNOSIS — N941 Unspecified dyspareunia: Secondary | ICD-10-CM

## 2018-08-24 LAB — POCT URINE PREGNANCY: Preg Test, Ur: NEGATIVE

## 2018-08-24 NOTE — Patient Instructions (Addendum)
Hysterectomy Information A hysterectomy is a surgery to remove your uterus. After surgery, you will no longer have periods. Also, you will not be able to get pregnant. Reasons for this surgery  You have bleeding that is not normal and keeps coming back.  You have lasting (chronic) lower belly (pelvic) pain.  You have a lasting infection.  The lining of your uterus grows outside your uterus.  The lining of your uterus grows in the muscle of your uterus.  Your uterus falls down into your vagina.  You have a growth in your uterus that causes problems.  You have cells that could turn into cancer (precancerous cells).  You have cancer of the uterus or cervix. Types There are 3 types of hysterectomies. Depending on the type, the surgery will:  Remove the top part of the uterus only.  Remove the uterus and the cervix.  Remove the uterus, cervix, and tissue that holds the uterus in place in the lower belly.  Ways a hysterectomy can be performed There are 5 ways this surgery can be performed.  A cut (incision) is made in the belly (abdomen). The uterus is taken out through the cut.  A cut is made in the vagina. The uterus is taken out through the cut.  Three or four cuts are made in the belly. A surgical device with a camera is put through one of the cuts. The uterus is cut into small pieces. The uterus is taken out through the cuts or the vagina.  Three or four cuts are made in the belly. A surgical device with a camera is put through one of the cuts. The uterus is taken out through the vagina.  Three or four cuts are made in the belly. A surgical device that is controlled by a computer makes a visual image. The device helps the surgeon control the surgical tools. The uterus is cut into small pieces. The pieces are taken out through the cuts or through the vagina.  What can I expect after the surgery?  You will be given pain medicine.  You will need help at home for 3-5 days  after surgery.  You will need to see your doctor in 2-4 weeks after surgery.  You may get hot flashes, have night sweats, and have trouble sleeping.  You may need to have Pap tests in the future if your surgery was related to cancer. Talk to your doctor. It is still good to have regular exams. This information is not intended to replace advice given to you by your health care provider. Make sure you discuss any questions you have with your health care provider. Document Released: 01/04/2012 Document Revised: 03/19/2016 Document Reviewed: 06/19/2013 Elsevier Interactive Patient Education  2018 [employer].  Leuprolide depot injection What is this medicine? LEUPROLIDE (loo PROE lide) is a man-made protein that acts like a natural hormone in the body. It decreases testosterone in men and decreases estrogen in women. In men, this medicine is used to treat advanced prostate cancer. In women, some forms of this medicine may be used to treat endometriosis, uterine fibroids, or other female hormone-related problems. This medicine may be used for other purposes; ask your health care provider or pharmacist if you have questions. COMMON BRAND NAME(S): Eligard, Lupron Depot, Lupron Depot-Ped, Viadur What should I tell my health care provider before I take this medicine? They need to know if you have any of these conditions: -diabetes -heart disease or previous heart attack -high blood pressure -high  cholesterol -mental illness -osteoporosis -pain or difficulty passing urine -seizures -spinal cord metastasis -stroke -suicidal thoughts, plans, or attempt; a previous suicide attempt by you or a family member -tobacco smoker -unusual vaginal bleeding (women) -an unusual or allergic reaction to leuprolide, benzyl alcohol, other medicines, foods, dyes, or preservatives -pregnant or trying to get pregnant -breast-feeding How should I use this medicine? This medicine is for injection into a muscle  or for injection under the skin. It is given by a health care professional in a hospital or clinic setting. The specific product will determine how it will be given to you. Make sure you understand which product you receive and how often you will receive it. Talk to your pediatrician regarding the use of this medicine in children. Special care may be needed. Overdosage: If you think you have taken too much of this medicine contact a poison control center or emergency room at once. NOTE: This medicine is only for you. Do not share this medicine with others. What if I miss a dose? It is important not to miss a dose. Call your doctor or health care professional if you are unable to keep an appointment. Depot injections: Depot injections are given either once-monthly, every 12 weeks, every 16 weeks, or every 24 weeks depending on the product you are prescribed. The product you are prescribed will be based on if you are female or female, and your condition. Make sure you understand your product and dosing. What may interact with this medicine? Do not take this medicine with any of the following medications: -chasteberry This medicine may also interact with the following medications: -herbal or dietary supplements, like black cohosh or DHEA -female hormones, like estrogens or progestins and birth control pills, patches, rings, or injections -female hormones, like testosterone This list may not describe all possible interactions. Give your health care provider a list of all the medicines, herbs, non-prescription drugs, or dietary supplements you use. Also tell them if you smoke, drink alcohol, or use illegal drugs. Some items may interact with your medicine. What should I watch for while using this medicine? Visit your doctor or health care professional for regular checks on your progress. During the first weeks of treatment, your symptoms may get worse, but then will improve as you continue your treatment. You  may get hot flashes, increased bone pain, increased difficulty passing urine, or an aggravation of nerve symptoms. Discuss these effects with your doctor or health care professional, some of them may improve with continued use of this medicine. Female patients may experience a menstrual cycle or spotting during the first months of therapy with this medicine. If this continues, contact your doctor or health care professional. What side effects may I notice from receiving this medicine? Side effects that you should report to your doctor or health care professional as soon as possible: -allergic reactions like skin rash, itching or hives, swelling of the face, lips, or tongue -breathing problems -chest pain -depression or memory disorders -pain in your legs or groin -pain at site where injected or implanted -seizures -severe headache -swelling of the feet and legs -suicidal thoughts or other mood changes -visual changes -vomiting Side effects that usually do not require medical attention (report to your doctor or health care professional if they continue or are bothersome): -breast swelling or tenderness -decrease in sex drive or performance -diarrhea -hot flashes -loss of appetite -muscle, joint, or bone pains -nausea -redness or irritation at site where injected or implanted -skin problems or  acne This list may not describe all possible side effects. Call your doctor for medical advice about side effects. You may report side effects to FDA at 1-800-FDA-1088. Where should I keep my medicine? This drug is given in a hospital or clinic and will not be stored at home. NOTE: This sheet is a summary. It may not cover all possible information. If you have questions about this medicine, talk to your doctor, pharmacist, or health care provider.  2018 Elsevier/Gold Standard (2016-03-26 09:45:53)

## 2018-08-25 ENCOUNTER — Encounter: Payer: Self-pay | Admitting: Obstetrics and Gynecology

## 2018-08-25 DIAGNOSIS — N941 Unspecified dyspareunia: Secondary | ICD-10-CM | POA: Insufficient documentation

## 2018-08-25 DIAGNOSIS — R102 Pelvic and perineal pain: Secondary | ICD-10-CM | POA: Insufficient documentation

## 2018-08-25 DIAGNOSIS — N921 Excessive and frequent menstruation with irregular cycle: Secondary | ICD-10-CM | POA: Insufficient documentation

## 2018-08-26 ENCOUNTER — Telehealth: Payer: Self-pay | Admitting: Obstetrics and Gynecology

## 2018-08-26 DIAGNOSIS — N921 Excessive and frequent menstruation with irregular cycle: Secondary | ICD-10-CM

## 2018-08-26 NOTE — Telephone Encounter (Signed)
Patient called and requested to speak with the nurse. She said, "I've considered my options Dr. Quincy Simmonds gave me and I'm leaning towards getting a hysterectomy."  Cc: Gay Filler

## 2018-08-26 NOTE — Telephone Encounter (Signed)
Call to patient. Desires to proceed with hysterectomy. Declines Lupron therapy.  Has scheduling limitations with employer so would like to proceed this year or may have to wait till next spring.  Discussed available date of 09-05-18. Patient very serious about proceeding. Needs to confirm with employer and family.  Will plan to schedule endometrial biospy and surgical consult on 08-29-18 with Dr Quincy Simmonds.    Reviewed above call with Dr Quincy Simmonds. Encounter closed.

## 2018-08-29 ENCOUNTER — Telehealth: Payer: Self-pay | Admitting: *Deleted

## 2018-08-29 ENCOUNTER — Other Ambulatory Visit: Payer: Self-pay

## 2018-08-29 ENCOUNTER — Encounter: Payer: Self-pay | Admitting: Obstetrics and Gynecology

## 2018-08-29 ENCOUNTER — Ambulatory Visit (INDEPENDENT_AMBULATORY_CARE_PROVIDER_SITE_OTHER): Payer: 59 | Admitting: Obstetrics and Gynecology

## 2018-08-29 VITALS — BP 146/80 | HR 90 | Ht 67.0 in | Wt 226.6 lb

## 2018-08-29 DIAGNOSIS — N941 Unspecified dyspareunia: Secondary | ICD-10-CM

## 2018-08-29 DIAGNOSIS — D219 Benign neoplasm of connective and other soft tissue, unspecified: Secondary | ICD-10-CM | POA: Diagnosis not present

## 2018-08-29 DIAGNOSIS — N921 Excessive and frequent menstruation with irregular cycle: Secondary | ICD-10-CM

## 2018-08-29 DIAGNOSIS — R102 Pelvic and perineal pain: Secondary | ICD-10-CM | POA: Diagnosis not present

## 2018-08-29 NOTE — Telephone Encounter (Signed)
Call back to patient. Several concerns regarding surgery due to precert pending. Patient will proceed with appointment as scheduled today to review with Dr Quincy Simmonds.

## 2018-08-29 NOTE — Patient Instructions (Signed)

## 2018-08-29 NOTE — Telephone Encounter (Signed)
Patient returned call. Patient is concerned about surgery not being pre-certified in time for surgery date and is considering deferring surgery.  Patient wants to discuss deferring surgery, options with a Mirena IUD and should she keep appointment today for endometrial biopsy.   Routing to Lamont Snowball, RN

## 2018-08-29 NOTE — Telephone Encounter (Signed)
Following call with patient. [employer] called and surgery was approved. Patient notified by Deloris Ping in business office.   Encounter closed.

## 2018-08-29 NOTE — Telephone Encounter (Signed)
Follow-up call to patient. Left message to call back.

## 2018-08-29 NOTE — Progress Notes (Signed)
surg GYNECOLOGY  VISIT   HPI: 47 y.o.   Married  Caucasian  female   [phone] with Patient's last menstrual period was 07/05/2018 (approximate).   here for endometrial biopsy and surgical consultation.   She has heavy bleeding and some pelvic pain.  Her main concern is her bleeding and not pain.  She does have pain with intercourse.  She does have anemia and hemoglobin of 9.9 on 08/16/18. States she does not feel very well overall and knows it is likely due to her anemia.  TSH 1.44 on 08/16/18.  Does have known hemorrhoids but otherwise negative endoscopy and colonoscopy with Dr. Earlean Shawl in September 2019.  Pelvic US 10/07/17  Several small fibroids, largest 2.66 cm.  Ovaries normal.   States she is having hot flashes.   Hx endometriosis.   Has some upper back pain, chronically.  She had pain when she is sleeping, otherwise no pain during the day.   Declines future childbearing and medical therapies.   GYNECOLOGIC HISTORY: Patient's last menstrual period was 07/05/2018 (approximate). Contraception:  Tubal  Menopausal hormone therapy: none Last mammogram: 03-15-18 Neg/density B/BiRads1 Last pap smear:  01-05-18 Neg:Neg HR HPV,                                12-16-16 ASCUS:Neg HR HPV         OB History    Gravida  3   Para  2   Term      Preterm      AB  1   Living  2     SAB  1   TAB      Ectopic      Multiple      Live Births                 Patient Active Problem List   Diagnosis Date Noted  . Menorrhagia with irregular cycle 08/25/2018  . Pelvic pain 08/25/2018  . Dyspareunia in female 08/25/2018  . Vertigo 09/15/2017    Past Medical History:  Diagnosis Date  . Abnormal Pap smear of cervix    2010-2014 abnormal pap every 3-6 months  ? Dysplasia  . Anemia   . Anxiety   . Depression   . DUB (dysfunctional uterine bleeding)   . Endometriosis   . Fibroid   . Hypertension   . Infertility, female   . Recurrent bacterial infection   .  Vertigo     Past Surgical History:  Procedure Laterality Date  . ABLATION ON ENDOMETRIOSIS  1995  . CERVICAL BIOPSY  W/ LOOP ELECTRODE EXCISION    . COLPOSCOPY     x 3  . DILATION AND CURETTAGE OF UTERUS  1993  . GYNECOLOGIC CRYOSURGERY    . INTRAUTERINE DEVICE (IUD) INSERTION  2014    (Mirena)  . TUBAL LIGATION     2010    Current Outpatient Medications  Medication Sig Dispense Refill  . ibuprofen (ADVIL,MOTRIN) 200 MG tablet Take 200 mg by mouth every 6 (six) hours as needed.    Marland Kitchen omeprazole (PRILOSEC) 40 MG capsule Take 40 mg by mouth daily.      No current facility-administered medications for this visit.      ALLERGIES: Patient has no known allergies.  Family History  Problem Relation Age of Onset  . Cancer Mother 85       Dec.Stage IV colon ca 10/2013--age 44  . Heart disease  Father   . Heart disease Paternal Grandfather   . Other Paternal Grandfather        Dec MVA  . Other Brother        Dec in a MVA age 40  . Alzheimer's disease Maternal Grandfather        Dec  . Other Maternal Grandfather   . Other Paternal Grandmother        Dec MVA  . Heart failure Paternal Aunt        Stage 4 colon cancer    Social History   Socioeconomic History  . Marital status: Married    Spouse name: Not on file  . Number of children: Not on file  . Years of education: Not on file  . Highest education level: Not on file  Occupational History  . Not on file  Social Needs  . Financial resource strain: Not on file  . Food insecurity:    Worry: Not on file    Inability: Not on file  . Transportation needs:    Medical: Not on file    Non-medical: Not on file  Tobacco Use  . Smoking status: Current Every Day Smoker    Types: Cigarettes  . Smokeless tobacco: Never Used  . Tobacco comment: smokes 1pack per week(2-3 cigs per day)  Substance and Sexual Activity  . Alcohol use: Yes    Alcohol/week: 5.0 standard drinks    Types: 5 Glasses of wine per week    Comment: 6-7  drinks a week (alcohol)  Red Bull and Vodka or red wine  . Drug use: No  . Sexual activity: Yes    Partners: Male    Birth control/protection: Surgical    Comment: BTL  Lifestyle  . Physical activity:    Days per week: Not on file    Minutes per session: Not on file  . Stress: Not on file  Relationships  . Social connections:    Talks on phone: Not on file    Gets together: Not on file    Attends religious service: Not on file    Active member of club or organization: Not on file    Attends meetings of clubs or organizations: Not on file    Relationship status: Not on file  . Intimate partner violence:    Fear of current or ex partner: Not on file    Emotionally abused: Not on file    Physically abused: Not on file    Forced sexual activity: Not on file  Other Topics Concern  . Not on file  Social History Narrative  . Not on file    Review of Systems  All other systems reviewed and are negative.   PHYSICAL EXAMINATION:    BP (!) 146/80 (BP Location: Right Arm, Patient Position: Sitting, Cuff Size: Large)   Pulse 90   Ht 5\' 7"  (1.702 m)   Wt 226 lb 9.6 oz (102.8 kg)   LMP 07/05/2018 (Approximate)   BMI 35.49 kg/m     General appearance: alert, cooperative and appears stated age Head: Normocephalic, without obvious abnormality, atraumatic Neck: no adenopathy, supple, symmetrical, trachea midline and thyroid normal to inspection and palpation Lungs: clear to auscultation bilaterally Heart: regular rate and rhythm Abdomen: soft, non-tender, no masses,  no organomegaly Extremities: extremities normal, atraumatic, no cyanosis or edema Skin: Skin color, texture, turgor normal. No rashes or lesions Lymph nodes: Cervical, supraclavicular, and axillary nodes normal. No abnormal inguinal nodes palpated Neurologic: Grossly normal  Pelvic:  External genitalia:  no lesions              Urethra:  normal appearing urethra with no masses, tenderness or lesions               Bartholins and Skenes: normal                 Vagina: normal appearing vagina with normal color and discharge, no lesions              Cervix: no lesions                Bimanual Exam:  Uterus:  normal size, contour, position, consistency, mobility, non-tender              Adnexa: no mass, fullness, tenderness           Chaperone was present for exam.  ASSESSMENT  Menorrhagia.  Fibroids.  Pelvic pain and dyspareunia. Anemia.  Hx endometriosis.  FH of cardiovascular disease.  Anxiety. Smoker.   PLAN  Will follow up on EMB results.  I discussed total laparoscopic hysterectomy with bilateral salpingectomy and possible bilateral oophorectomy, cystoscopy.   Would like to keep her ovaries if normal. For a few lesions of endometriosis, she would like to leave her ovaries in place and consider future surgery if needed for pain.   I reviewed risks, benefits, and alternatives.  Risks include but are not limited to bleeding, infection, damage to surrounding organs, pneumonia, reaction to anesthesia, DVT, PE, death, need for reoperation, hernia formation, vaginal cuff dehiscence, neuropathy, continued pain, need to convert to a traditional laparotomy incision to complete the procedure, and menopausal symptoms with increased risk of cardiovascular disease and osteoporosis if ovaries are removed.    Surgical expectations and recovery discussed.  Patient wishes to proceed. Restart oral iron.  She will do a magnesium citrate bowel prep.  She will receive Lovenox for DVT/PE prophylaxis. Questions invited and answered.  An After Visit Summary was printed and given to the patient.  __25____ minutes face to face time of which over 50% was spent in counseling.

## 2018-08-29 NOTE — Telephone Encounter (Signed)
Call placed to patient to review benefits for scheduled endometrial biopsy and benefits for surgery. Left voicemail message requesting a call

## 2018-08-30 ENCOUNTER — Encounter: Payer: Self-pay | Admitting: Obstetrics & Gynecology

## 2018-09-01 NOTE — Patient Instructions (Addendum)
Your procedure is scheduled on  ____11-12-2019______  _  Report to [facility] AT :  5:30 AM    Call this number if you have problems the morning of surgery  [phone].    OUR AD[address]ESS IS [address].  WE ARE LOCATED IN [facility].                                       REMEMBER:  DO NOT EAT FOOD OR DRINK LIQUIDS AFTER MIDNIGHT .     TAKE THESE MEDICATIONS MORNING OF SURGERY WITH A SIP OF WATER:   Omeprazole (prilosec)   HANDOUT FOR RECOVERY CARE  GUIDELINES GIVEN AND REMEMBER   IF YOU ARE SPENDING THE NIGHT AFTER SURGERY PLEASE BRING ALL YOUR PRESCRIPTION MEDICATIONS IN THEIR ORIGINAL BOTTLES.                                      DO NOT WEAR JEWERLY, MAKE UP, OR NAIL POLISH,   DO NOT WEAR LOTIONS, POWDERS, PERFUMES OR DEODORANT.   DO NOT SHAVE FOR 24 HOURS PRIOR TO DAY OF SURGERY.   CONTACTS, GLASSES, OR DENTURES MAY NOT BE WORN TO SURGERY.                                       [facility] IS NOT RESPONSIBLE  FOR ANY BELONGINGS.                                                                    Marland Kitchen                                                                                                     WHAT IS A BLOOD TRANSFUSION? Blood Transfusion Information  A transfusion is the replacement of blood or some of its parts. Blood is made up of multiple cells which provide different functions.  Red blood cells carry oxygen and are used for blood loss replacement.  White blood cells fight against infection.  Platelets control bleeding.  Plasma helps clot blood.  Other blood products are available for specialized needs, such as hemophilia or other clotting disorders. BEFORE THE TRANSFUSION  Who gives blood for transfusions?   Healthy volunteers who are fully evaluated to make sure their blood is safe. This is blood bank blood. Transfusion therapy is the safest it has ever been in the practice of medicine. Before blood is  taken from a donor, a complete history is taken to make sure that person has no history of diseases nor engages in risky  social behavior (examples are intravenous drug use or sexual activity with multiple partners). The donor's travel history is screened to minimize risk of transmitting infections, such as malaria. The donated blood is tested for signs of infectious diseases, such as HIV and hepatitis. The blood is then tested to be sure it is compatible with you in order to minimize the chance of a transfusion reaction. If you or a relative donates blood, this is often done in anticipation of surgery and is not appropriate for emergency situations. It takes many days to process the donated blood. RISKS AND COMPLICATIONS Although transfusion therapy is very safe and saves many lives, the main dangers of transfusion include:   Getting an infectious disease.  Developing a transfusion reaction. This is an allergic reaction to something in the blood you were given. Every precaution is taken to prevent this. The decision to have a blood transfusion has been considered carefully by your caregiver before blood is given. Blood is not given unless the benefits outweigh the risks. AFTER THE TRANSFUSION  Right after receiving a blood transfusion, you will usually feel much better and more energetic. This is especially true if your red blood cells have gotten low (anemic). The transfusion raises the level of the red blood cells which carry oxygen, and this usually causes an energy increase.  The nurse administering the transfusion will monitor you carefully for complications. HOME CARE INSTRUCTIONS  No special instructions are needed after a transfusion. You may find your energy is better. Speak with your caregiver about any limitations on activity for underlying diseases you may have. SEEK MEDICAL CARE IF:   Your condition is not improving after your transfusion.  You develop redness or irritation at the  intravenous (IV) site. SEEK IMMEDIATE MEDICAL CARE IF:  Any of the following symptoms occur over the next 12 hours:  Shaking chills.  You have a temperature by mouth above 102 F (38.9 C), not controlled by medicine.  Chest, back, or muscle pain.  People around you feel you are not acting correctly or are confused.  Shortness of breath or difficulty breathing.  Dizziness and fainting.  You get a rash or develop hives.  You have a decrease in urine output.  Your urine turns a dark color or changes to pink, red, or brown. Any of the following symptoms occur over the next 10 days:  You have a temperature by mouth above 102 F (38.9 C), not controlled by medicine.  Shortness of breath.  Weakness after normal activity.  The white part of the eye turns yellow (jaundice).  You have a decrease in the amount of urine or are urinating less often.  Your urine turns a dark color or changes to pink, red, or brown. Document Released: 10/09/2000 Document Revised: 01/04/2012 Document Reviewed: 05/28/2008 ExitCare Patient Information 2014 [employer].  _______________________________________________________________________[facility] - Preparing for Surgery Before surgery, you can play an important role.  Because skin is not sterile, your skin needs to be as free of germs as possible.  You can reduce the number of germs on your skin by washing with CHG (chlorahexidine gluconate) soap before surgery.  CHG is an antiseptic cleaner which kills germs and bonds with the skin to continue killing germs even after washing. Please DO NOT use if you have an allergy to CHG or antibacterial soaps.  If your skin becomes reddened/irritated stop using the CHG and inform your nurse when you arrive at Short Stay. Do not shave (including legs and underarms) for  at least 48 hours prior to the first CHG shower.  You may shave your face/neck. Please follow these instructions carefully:  1.  Shower with CHG  Soap the night before surgery and the  morning of Surgery.  2.  If you choose to wash your hair, wash your hair first as usual with your  normal  shampoo.  3.  After you shampoo, rinse your hair and body thoroughly to remove the  shampoo.                            4.  Use CHG as you would any other liquid soap.  You can apply chg directly  to the skin and wash                       Gently with a scrungie or clean washcloth.  5.  Apply the CHG Soap to your body ONLY FROM THE NECK DOWN.   Do not use on face/ open                           Wound or open sores. Avoid contact with eyes, ears mouth and genitals (private parts).                       Wash face,  Genitals (private parts) with your normal soap.             6.  Wash thoroughly, paying special attention to the area where your surgery  will be performed.  7.  Thoroughly rinse your body with warm water from the neck down.  8.  DO NOT shower/wash with your normal soap after using and rinsing off  the CHG Soap.             9.  Pat yourself dry with a clean towel.            10.  Wear clean pajamas.            11.  Place clean sheets on your bed the night of your first shower and do not  sleep with pets. Day of Surgery : Do not apply any lotions/deodorants the morning of surgery.  Please wear clean clothes to the hospital/surgery center.  FAILURE TO FOLLOW THESE INSTRUCTIONS MAY RESULT IN THE CANCELLATION OF YOUR SURGERY PATIENT SIGNATURE_________________________________  NURSE SIGNATURE__________________________________  ________________________________________________________________________

## 2018-09-02 ENCOUNTER — Other Ambulatory Visit: Payer: Self-pay | Admitting: *Deleted

## 2018-09-02 ENCOUNTER — Encounter (HOSPITAL_COMMUNITY)
Admission: RE | Admit: 2018-09-02 | Discharge: 2018-09-02 | Disposition: A | Discharge status: Home / Self Care | Payer: 59 | Source: Ambulatory Visit

## 2018-09-02 DIAGNOSIS — R87613 High grade squamous intraepithelial lesion on cytologic smear of cervix (HGSIL): Secondary | ICD-10-CM

## 2018-09-05 ENCOUNTER — Other Ambulatory Visit (HOSPITAL_COMMUNITY)
Admission: RE | Admit: 2018-09-05 | Discharge: 2018-09-05 | Disposition: A | Discharge status: Home / Self Care | Payer: 59 | Source: Ambulatory Visit | Attending: Obstetrics and Gynecology | Admitting: Obstetrics and Gynecology

## 2018-09-05 ENCOUNTER — Encounter: Payer: Self-pay | Admitting: Obstetrics and Gynecology

## 2018-09-05 ENCOUNTER — Ambulatory Visit (INDEPENDENT_AMBULATORY_CARE_PROVIDER_SITE_OTHER): Payer: 59 | Admitting: Obstetrics and Gynecology

## 2018-09-05 ENCOUNTER — Other Ambulatory Visit: Payer: Self-pay

## 2018-09-05 DIAGNOSIS — R87613 High grade squamous intraepithelial lesion on cytologic smear of cervix (HGSIL): Secondary | ICD-10-CM | POA: Insufficient documentation

## 2018-09-05 NOTE — Progress Notes (Signed)
  Subjective:     Patient ID: Andrea [name], female   DOB: 05-17-1971, 47 y.o.   MRN: [id number]  HPI  Patient here today for colposcopy due to dysplastic squamous cells of cervix seen on pathology from [facility].  Normal pap and negative HR HPV on 01/05/18.  Hx cryotherapy and then later LEEP (1995).   Having an iron infusion next week.  Feels poorly from her anemia.  Declines future childbearing.  Would like hysterectomy after cervical dysplasia evaluation is complete.  Review of Systems  All other systems reviewed and are negative.   LMP:07-05-18 Contraception: Tubal       Objective:   Physical Exam  Genitourinary:      Colposcopy - cervix, vagina.  Consent for procedure.  Pap and HR HPV taken.  3% acetic acid used in vagina and on vulva. White light and green light filter used.  Colposcopy satisfactory:  Yes   _____          No    __x___ Findings:    Cervix:  Slightly raised acetowhite lesion at 5:00, only seen with use of endocervical speculum.  Unable to biopsy due to locaiton.  Vagina: no lesions nosed. Biopsies:   ECC.  Lugol's placed and no decreased uptake noted.  Monsel's placed.  Minimal EBL. No complications.      Assessment:     EMB showing potential high grade cervical dysplasia.  Hx cryo and LEEP.     Plan:     FU pap, HR HPV testing, and ECC.  I anticipate doing a cold knife conization.  I discussed risks and benefits.  Risks may include - bleeding, infection damage to surrounding organs, possible positive margin, and need for additional treatment.  Questions invited and answered.   ___15____ minutes face to face time of which over 50% was spent in counseling.

## 2018-09-05 NOTE — Patient Instructions (Signed)
Colposcopy, Care After  This sheet gives you information about how to care for yourself after your procedure. Your doctor may also give you more specific instructions. If you have problems or questions, contact your doctor.  What can I expect after the procedure?  If you did not have a tissue sample removed (did not have a biopsy), you may only have some spotting for a few days. You can go back to your normal activities.  If you had a tissue sample removed, it is common to have:  · Soreness and pain. This may last for a few days.  · Light-headedness.  · Mild bleeding from your vagina or dark-colored, grainy discharge from your vagina. This may last for a few days. You may need to wear a sanitary pad.  · Spotting for at least 48 hours after the procedure.    Follow these instructions at home:  · Take over-the-counter and prescription medicines only as told by your doctor. Ask your doctor what medicines you can start taking again. This is very important if you take blood-thinning medicine.  · Do not drive or use heavy machinery while taking prescription pain medicine.  · For 3 days, or as long as your doctor tells you, avoid:  ? Douching.  ? Using tampons.  ? Having sex.  · If you use birth control (contraception), keep using it.  · Limit activity for the first day after the procedure. Ask your doctor what activities are safe for you.  · It is up to you to get the results of your procedure. Ask your doctor when your results will be ready.  · Keep all follow-up visits as told by your doctor. This is important.  Contact a doctor if:  · You get a skin rash.  Get help right away if:  · You are bleeding a lot from your vagina. It is a lot of bleeding if you are using more than one pad an hour for 2 hours in a row.  · You have clumps of blood (blood clots) coming from your vagina.  · You have a fever.  · You have chills  · You have pain in your lower belly (pelvic area).  · You have signs of infection, such as vaginal  discharge that is:  ? Different than usual.  ? Yellow.  ? Bad-smelling.  · You have very pain or cramps in your lower belly that do not get better with medicine.  · You feel light-headed.  · You feel dizzy.  · You pass out (faint).  Summary  · If you did not have a tissue sample removed (did not have a biopsy), you may only have some spotting for a few days. You can go back to your normal activities.  · If you had a tissue sample removed, it is common to have mild pain and spotting for 48 hours.  · For 3 days, or as long as your doctor tells you, avoid douching, using tampons and having sex.  · Get help right away if you have bleeding, very bad pain, or signs of infection.  This information is not intended to replace advice given to you by your health care provider. Make sure you discuss any questions you have with your health care provider.  Document Released: 03/30/2008 Document Revised: 07/01/2016 Document Reviewed: 07/01/2016  Elsevier Interactive Patient Education © 2018 [employer]vier Inc.

## 2018-09-06 ENCOUNTER — Encounter (HOSPITAL_BASED_OUTPATIENT_CLINIC_OR_DEPARTMENT_OTHER): Admission: RE | Payer: Self-pay | Source: Ambulatory Visit

## 2018-09-06 ENCOUNTER — Ambulatory Visit (HOSPITAL_BASED_OUTPATIENT_CLINIC_OR_DEPARTMENT_OTHER): Admission: RE | Admit: 2018-09-06 | Payer: 59 | Source: Ambulatory Visit | Admitting: Obstetrics and Gynecology

## 2018-09-06 ENCOUNTER — Telehealth: Payer: Self-pay | Admitting: Obstetrics and Gynecology

## 2018-09-06 SURGERY — HYSTERECTOMY, TOTAL, LAPAROSCOPIC, WITH SALPINGECTOMY
Anesthesia: General

## 2018-09-06 NOTE — Telephone Encounter (Signed)
Call to patient. Patient states that she felt horrible yesterday after her colposcopy. States she took a bath last night because she "felt so bad." Started having chills and took her temperature ~45 minutes later and temp was 99.9. Patient states she took 4 ibuprofen and went to bed. Woke up this morning and took her temperature around 0730 and was 97.7. States she thinks she took 2-3 more ibuprofen sometime today. Took her temperature again around 1500 and was 97.9. Patient states she is having some pain below her belly button, but thinks it is "bowel related." Called "nurse line" last night and was advised to follow up with MD office today. Denies bleeding or malodorous discharge. OV offered to patient for evaluation, but she declines stating she will continue to monitor symptoms and return call if low grade fever returns. Pain, bleeding and fever precautions for urgent care reviewed with patient and she verbalized understanding.  Routing to provider and will close encounter.

## 2018-09-06 NOTE — Telephone Encounter (Signed)
Patient had a low grade fever last night after having a colposcopy yesterday.

## 2018-09-08 LAB — CYTOLOGY - PAP
Diagnosis: NEGATIVE
HPV: NOT DETECTED

## 2018-09-09 ENCOUNTER — Encounter: Payer: Self-pay | Admitting: Oncology

## 2018-09-09 ENCOUNTER — Telehealth: Payer: Self-pay | Admitting: Obstetrics and Gynecology

## 2018-09-09 ENCOUNTER — Inpatient Hospital Stay: Payer: 59

## 2018-09-09 ENCOUNTER — Inpatient Hospital Stay: Payer: 59 | Attending: Oncology | Admitting: Oncology

## 2018-09-09 ENCOUNTER — Inpatient Hospital Stay: Payer: 59 | Admitting: Oncology

## 2018-09-09 VITALS — BP 147/106 | HR 91 | Temp 97.9°F | Resp 18 | Ht 67.0 in | Wt 227.6 lb

## 2018-09-09 DIAGNOSIS — D509 Iron deficiency anemia, unspecified: Secondary | ICD-10-CM | POA: Diagnosis present

## 2018-09-09 DIAGNOSIS — F419 Anxiety disorder, unspecified: Secondary | ICD-10-CM | POA: Insufficient documentation

## 2018-09-09 DIAGNOSIS — I10 Essential (primary) hypertension: Secondary | ICD-10-CM | POA: Insufficient documentation

## 2018-09-09 DIAGNOSIS — K5900 Constipation, unspecified: Secondary | ICD-10-CM | POA: Diagnosis not present

## 2018-09-09 DIAGNOSIS — N920 Excessive and frequent menstruation with regular cycle: Secondary | ICD-10-CM | POA: Diagnosis not present

## 2018-09-09 DIAGNOSIS — F329 Major depressive disorder, single episode, unspecified: Secondary | ICD-10-CM | POA: Insufficient documentation

## 2018-09-09 DIAGNOSIS — Z79899 Other long term (current) drug therapy: Secondary | ICD-10-CM

## 2018-09-09 DIAGNOSIS — F17210 Nicotine dependence, cigarettes, uncomplicated: Secondary | ICD-10-CM | POA: Diagnosis not present

## 2018-09-09 LAB — CBC WITH DIFFERENTIAL/PLATELET
Abs Immature Granulocytes: 0.01 10*3/uL (ref 0.00–0.07)
Basophils Absolute: 0.1 10*3/uL (ref 0.0–0.1)
Basophils Relative: 1 %
Eosinophils Absolute: 0.3 10*3/uL (ref 0.0–0.5)
Eosinophils Relative: 5 %
HCT: 34.1 % — ABNORMAL LOW (ref 36.0–46.0)
Hemoglobin: 10.1 g/dL — ABNORMAL LOW (ref 12.0–15.0)
Immature Granulocytes: 0 %
Lymphocytes Relative: 30 %
Lymphs Abs: 1.6 10*3/uL (ref 0.7–4.0)
MCH: 23.3 pg — ABNORMAL LOW (ref 26.0–34.0)
MCHC: 29.6 g/dL — ABNORMAL LOW (ref 30.0–36.0)
MCV: 78.6 fL — ABNORMAL LOW (ref 80.0–100.0)
Monocytes Absolute: 0.4 10*3/uL (ref 0.1–1.0)
Monocytes Relative: 8 %
Neutro Abs: 3 10*3/uL (ref 1.7–7.7)
Neutrophils Relative %: 56 %
Platelets: 344 10*3/uL (ref 150–400)
RBC: 4.34 MIL/uL (ref 3.87–5.11)
RDW: 17.1 % — ABNORMAL HIGH (ref 11.5–15.5)
WBC: 5.4 10*3/uL (ref 4.0–10.5)
nRBC: 0 % (ref 0.0–0.2)

## 2018-09-09 LAB — IRON AND TIBC
Iron: 17 ug/dL — ABNORMAL LOW (ref 28–170)
Saturation Ratios: 4 % — ABNORMAL LOW (ref 10.4–31.8)
TIBC: 490 ug/dL — ABNORMAL HIGH (ref 250–450)
UIBC: 473 ug/dL

## 2018-09-09 LAB — URINALYSIS, COMPLETE (UACMP) WITH MICROSCOPIC
Bacteria, UA: NONE SEEN
Bilirubin Urine: NEGATIVE
Glucose, UA: NEGATIVE mg/dL
Hgb urine dipstick: NEGATIVE
Ketones, ur: NEGATIVE mg/dL
Leukocytes, UA: NEGATIVE
Nitrite: NEGATIVE
Protein, ur: NEGATIVE mg/dL
Specific Gravity, Urine: 1.012 (ref 1.005–1.030)
pH: 6 (ref 5.0–8.0)

## 2018-09-09 LAB — VITAMIN B12: Vitamin B-12: 380 pg/mL (ref 180–914)

## 2018-09-09 LAB — FOLATE: Folate: 19.2 ng/mL (ref >5.9)

## 2018-09-09 LAB — FERRITIN: Ferritin: 9 ng/mL — ABNORMAL LOW (ref 11–307)

## 2018-09-09 NOTE — Telephone Encounter (Signed)
Dr. Quincy Simmonds. Please review colpo results dated 09/05/18 and advise on f/u.

## 2018-09-09 NOTE — Telephone Encounter (Signed)
Patient is wondering if her results are in from her colposcopy 09/05/18.

## 2018-09-09 NOTE — Telephone Encounter (Addendum)
Spoke with patient, advised of results and recommendations as seen below per Dr. Quincy Simmonds. Patient verbalizes understanding and would like to proceed with plan.   Advised patient Andrea Blitz, RN and [name] will f/u with surgical planning.   Patient thankful for return call.   Routing to S. Orvan Seen, RN   Cc: [name]

## 2018-09-09 NOTE — Telephone Encounter (Signed)
See separate result note regarding her normal pap, negative HR HPV, and negative ECC.  Cold knife conization with ECC at the hospital is recommended.  Patient is aware of this likely plan.   Cc- Lamont Snowball

## 2018-09-12 ENCOUNTER — Telehealth: Payer: Self-pay

## 2018-09-12 ENCOUNTER — Ambulatory Visit: Payer: 59 | Admitting: Obstetrics and Gynecology

## 2018-09-12 ENCOUNTER — Encounter: Payer: Self-pay | Admitting: Oncology

## 2018-09-12 DIAGNOSIS — D509 Iron deficiency anemia, unspecified: Secondary | ICD-10-CM | POA: Insufficient documentation

## 2018-09-12 NOTE — Telephone Encounter (Signed)
-----   Message from Sindy Guadeloupe, MD sent at 09/12/2018  7:51 AM EST ----- Please call her inform her that she is iron deficient. We can set her up for 2 doses of feraheme as discussed. Thanks, Astrid Divine

## 2018-09-12 NOTE — Progress Notes (Signed)
Hematology/Oncology Consult note Andrea [name] Telephone:([phone] [phone]  Patient Care Team: Jilda Panda, MD as PCP - General (Internal Medicine) Nahser, Wonda Cheng, MD as PCP - Cardiology (Cardiology)   Name of the patient: Andrea [name]  [id number]  1971/02/07    Reason for referral-iron deficiency anemia   Referring physician-Dr. Mellody Drown  Date of visit: 09/12/18   History of presenting illness- Patient is a point female who has been referred to [place] for iron deficiency anemia.  Her blood work from August 2019 showed H&H of 12.6/38.8 with an MCV of 90.7.  White count and platelet count were normal.  Patient reports that since then she has had a recent hemoglobin checked 2 to 3 weeks ago when her hemoglobin was 9.  She has been taking oral iron on and off for the last couple of months but does report significant constipation and abdominal pain secondary to it.  She did undergo EGD and colonoscopy which was apparently unremarkable.  Patient also has a history of abnormal Pap smear and is scheduled to undergo colposcopy soon.  She has been having heavy menstrual.'s and wanted to initially undergo hysterectomy which has been on hold due to her upcoming colposcopy.  She reports feeling significantly fatigued at work but denies other complaints.  Patient's mother had colon cancer at 62.  Patient denies any changes in her appetite or unintentional weight loss.  Denies any blood loss in her stool or urine.  Denies any dark melanotic stools  ECOG PS- 0  Pain scale- 0   Review of systems- Review of Systems  Constitutional: Negative for chills, fever, malaise/fatigue and weight loss.  HENT: Negative for congestion, ear discharge and nosebleeds.   Eyes: Negative for blurred vision.  Respiratory: Negative for cough, hemoptysis, sputum production, shortness of breath and wheezing.   Cardiovascular: Negative for chest pain, palpitations, orthopnea and  claudication.  Gastrointestinal: Negative for abdominal pain, blood in stool, constipation, diarrhea, heartburn, melena, nausea and vomiting.  Genitourinary: Negative for dysuria, flank pain, frequency, hematuria and urgency.  Musculoskeletal: Negative for back pain, joint pain and myalgias.  Skin: Negative for rash.  Neurological: Negative for dizziness, tingling, focal weakness, seizures, weakness and headaches.  Endo/Heme/Allergies: Does not bruise/bleed easily.  Psychiatric/Behavioral: Negative for depression and suicidal ideas. The patient does not have insomnia.     No Known Allergies  Patient Active Problem List   Diagnosis Date Noted  . Menorrhagia with irregular cycle 08/25/2018  . Pelvic pain 08/25/2018  . Dyspareunia in female 08/25/2018  . Vertigo 09/15/2017     Past Medical History:  Diagnosis Date  . Abnormal Pap smear of cervix    2010-2014 abnormal pap every 3-6 months  ? Dysplasia  . Anemia   . Anxiety   . Depression   . DUB (dysfunctional uterine bleeding)   . Endometriosis   . Fibroid   . Hypertension   . Infertility, female   . Recurrent bacterial infection   . Vertigo      Past Surgical History:  Procedure Laterality Date  . ABLATION ON ENDOMETRIOSIS  1995  . CERVICAL BIOPSY  W/ LOOP ELECTRODE EXCISION    . COLPOSCOPY     x 3  . DILATION AND CURETTAGE OF UTERUS  1993  . GYNECOLOGIC CRYOSURGERY    . INTRAUTERINE DEVICE (IUD) INSERTION  2014    (Mirena)  . TUBAL LIGATION     2010    Social History   Socioeconomic History  . Marital status:  Married    Spouse name: Not on file  . Number of children: Not on file  . Years of education: Not on file  . Highest education level: Not on file  Occupational History  . Not on file  Social Needs  . Financial resource strain: Not on file  . Food insecurity:    Worry: Not on file    Inability: Not on file  . Transportation needs:    Medical: Not on file    Non-medical: Not on file  Tobacco Use   . Smoking status: Current Every Day Smoker    Types: Cigarettes  . Smokeless tobacco: Never Used  . Tobacco comment: smokes 1pack per week(2-3 cigs per day)  Substance and Sexual Activity  . Alcohol use: Yes    Alcohol/week: 5.0 standard drinks    Types: 5 Glasses of wine per week    Comment: 6-7 drinks a week (alcohol)  Red Bull and Vodka or red wine  . Drug use: No  . Sexual activity: Yes    Partners: Male    Birth control/protection: Surgical    Comment: BTL  Lifestyle  . Physical activity:    Days per week: Not on file    Minutes per session: Not on file  . Stress: Not on file  Relationships  . Social connections:    Talks on phone: Not on file    Gets together: Not on file    Attends religious service: Not on file    Active member of club or organization: Not on file    Attends meetings of clubs or organizations: Not on file    Relationship status: Not on file  . Intimate partner violence:    Fear of current or ex partner: Not on file    Emotionally abused: Not on file    Physically abused: Not on file    Forced sexual activity: Not on file  Other Topics Concern  . Not on file  Social History Narrative  . Not on file     Family History  Problem Relation Age of Onset  . Cancer Mother 6       Dec.Stage IV colon ca 10/2013--age 73  . Heart disease Father   . Heart disease Paternal Grandfather   . Other Paternal Grandfather        Dec MVA  . Other Brother        Dec in a MVA age 41  . Alzheimer's disease Maternal Grandfather        Dec  . Other Maternal Grandfather   . Other Paternal Grandmother        Dec MVA  . Heart failure Paternal Aunt        Stage 4 colon cancer     Current Outpatient Medications:  .  omeprazole (PRILOSEC) 40 MG capsule, Take 40 mg by mouth daily. , Disp: , Rfl:  .  clobetasol cream (TEMOVATE) 9.50 %, Apply 1 application topically daily as needed (for irritation)., Disp: , Rfl:  .  ibuprofen (ADVIL,MOTRIN) 200 MG tablet, Take  600-800 mg by mouth every 6 (six) hours as needed for headache or moderate pain. , Disp: , Rfl:    Physical exam:  Vitals:   09/09/18 1115  BP: (!) 147/106  Pulse: 91  Resp: 18  Temp: 97.9 F (36.6 C)  TempSrc: Tympanic  SpO2: 97%  Weight: 227 lb 9.6 oz (103.2 kg)  Height: 5\' 7"  (1.702 m)   Physical Exam  Constitutional: She is oriented  to person, place, and time. She appears well-developed and well-nourished.  HENT:  Head: Normocephalic and atraumatic.  Eyes: Pupils are equal, round, and reactive to light. EOM are normal.  Neck: Normal range of motion.  Cardiovascular: Normal rate, regular rhythm and normal heart sounds.  Pulmonary/Chest: Effort normal and breath sounds normal.  Abdominal: Soft. Bowel sounds are normal.  Neurological: She is alert and oriented to person, place, and time.  Skin: Skin is warm and dry.       CMP Latest Ref Rng & Units 06/03/2017  Glucose 65 - 99 mg/dL 103(H)  BUN 6 - 20 mg/dL 18  Creatinine 0.44 - 1.00 mg/dL 0.93  Sodium 135 - 145 mmol/L 137  Potassium 3.5 - 5.1 mmol/L 4.4  Chloride 101 - 111 mmol/L 100(L)  CO2 22 - 32 mmol/L 30  Calcium 8.9 - 10.3 mg/dL 9.2   CBC Latest Ref Rng & Units 09/09/2018  WBC 4.0 - 10.5 K/uL 5.4  Hemoglobin 12.0 - 15.0 g/dL 10.1(L)  Hematocrit 36.0 - 46.0 % 34.1(L)  Platelets 150 - 400 K/uL 344    Assessment and plan- Patient is a 47 y.o. female referred for iron deficiency anemia  With regards to etiology of her iron deficiency: This may be very well due to her menorrhagia.  It remains to be seen if her iron deficiency resolves after she gets her hysterectomy which may be sometime in the near future.  If her iron deficiency persists despite a hysterectomy, she will need to speak with her GI regarding capsule endoscopy to complete her GI work-up.  At this time I will check her CBC ferritin and iron studies B12 and folate, urinalysis as well as stool H. pylori antigen  We will call her once the results of her  blood work are back and if there is evidence of iron deficiency I would like to give her a trial of IV iron.  Discussed risks and benefits of Feraheme 510 mg IV weekly x2 including all but not limited to headache, leg swelling and possible risk of infusion reaction.  Patient understands and agrees to proceed as planned.  I will see her back in 2 weeks time to discuss the results of her blood work but schedule her for IV iron in the meanwhile if need be   Thank you for this kind referral and the opportunity to participate in the care of this patient   Visit Diagnosis 1. Iron deficiency anemia, unspecified iron deficiency anemia type     Dr. Randa Evens, MD, MPH [name] [phone] 09/12/2018 8:36 AM

## 2018-09-12 NOTE — Telephone Encounter (Signed)
Spoke with the patient to inform her that she is Iron def. And will benefit from 2 doses of feraheme. The patient was agreeable and understanding to receive treatment.

## 2018-09-13 ENCOUNTER — Other Ambulatory Visit: Payer: Self-pay

## 2018-09-13 DIAGNOSIS — D509 Iron deficiency anemia, unspecified: Secondary | ICD-10-CM | POA: Diagnosis not present

## 2018-09-13 LAB — CELIAC DISEASE PANEL
Endomysial Ab, IgA: NEGATIVE
IgA: 181 mg/dL (ref 87–352)
Tissue Transglutaminase Ab, IgA: <2 U/mL (ref 0–3)

## 2018-09-14 ENCOUNTER — Inpatient Hospital Stay: Payer: 59

## 2018-09-14 VITALS — BP 135/80 | HR 85 | Temp 97.8°F | Resp 18

## 2018-09-14 DIAGNOSIS — D509 Iron deficiency anemia, unspecified: Secondary | ICD-10-CM | POA: Diagnosis not present

## 2018-09-14 LAB — H. PYLORI ANTIGEN, STOOL: H. Pylori Stool Ag, Eia: NEGATIVE

## 2018-09-14 MED ORDER — SODIUM CHLORIDE 0.9 % IV SOLN
Freq: Once | INTRAVENOUS | Status: AC
  Administered 2018-09-14: 14:00:00 via INTRAVENOUS
  Filled 2018-09-14: qty 250

## 2018-09-14 MED ORDER — SODIUM CHLORIDE 0.9 % IV SOLN
510.0000 mg | Freq: Once | INTRAVENOUS | Status: AC
  Administered 2018-09-14: 510 mg via INTRAVENOUS
  Filled 2018-09-14: qty 17

## 2018-09-14 NOTE — Telephone Encounter (Signed)
Spoke with patient regarding benefit for surgery. Patient understood and agreeable. Patient has confirmed and is ready schedule. Patient aware this is professional benefit only. Patient aware will be contacted by hospital for separate benefits. Forwarding to Conservation officer, historic buildings for scheduling  Routing to Lamont Snowball, RN

## 2018-09-14 NOTE — Telephone Encounter (Signed)
Call to patient. Reviewed surgery date options. Patient interested in proceeding next week. Needs to confirm activity restrictions post op. Advised will confirm with DR Quincy Simmonds and call back. Will plan for 09-20-18 pending review with Dr Quincy Simmonds.

## 2018-09-15 ENCOUNTER — Encounter (HOSPITAL_BASED_OUTPATIENT_CLINIC_OR_DEPARTMENT_OTHER): Payer: Self-pay

## 2018-09-15 ENCOUNTER — Other Ambulatory Visit: Payer: Self-pay

## 2018-09-15 NOTE — Progress Notes (Signed)
Spoke with: [name] NPO:  No food after midnight/Clear liquids until 7:00am DOS, NO SMOKING Arrival time: 1130AM Labs: UPT (CBC, BMP, T&S 09/19/2018 Epic, EKG 06/29/2018 CHART/EPIC)  AM medications: OMEPRAZOLE Pre op orders: Yes Ride home:  Octavia Bruckner (husband) [phone]

## 2018-09-15 NOTE — Telephone Encounter (Signed)
Reviewed Dr Elza Rafter recommendations for post-op activity. Patient desires to proceed with surgery on 09-20-18 a planned. Will ammend plans.  Surgery instruction sheet reviewed. Patient has talked with surgery center.   Routing to Dr Quincy Simmonds. Encounter closed.

## 2018-09-16 ENCOUNTER — Telehealth: Payer: Self-pay | Admitting: Obstetrics and Gynecology

## 2018-09-16 NOTE — Telephone Encounter (Signed)
Patient states that she is returning a call to [facility]. No open telephone note?

## 2018-09-16 NOTE — Telephone Encounter (Signed)
Call to patient. Advised Dr Quincy Simmonds wants to add fractional D&C to surgical procedure on Tuesday. Patient agreeable.   Routing to Dr Quincy Simmonds. Encounter closed.

## 2018-09-19 ENCOUNTER — Encounter (HOSPITAL_COMMUNITY)
Admission: RE | Admit: 2018-09-19 | Discharge: 2018-09-19 | Disposition: A | Discharge status: Home / Self Care | Payer: 59 | Source: Ambulatory Visit | Attending: Obstetrics and Gynecology | Admitting: Obstetrics and Gynecology

## 2018-09-19 ENCOUNTER — Telehealth: Payer: Self-pay | Admitting: Obstetrics and Gynecology

## 2018-09-19 DIAGNOSIS — F329 Major depressive disorder, single episode, unspecified: Secondary | ICD-10-CM | POA: Diagnosis not present

## 2018-09-19 DIAGNOSIS — F419 Anxiety disorder, unspecified: Secondary | ICD-10-CM | POA: Diagnosis not present

## 2018-09-19 DIAGNOSIS — N871 Moderate cervical dysplasia: Secondary | ICD-10-CM | POA: Diagnosis present

## 2018-09-19 DIAGNOSIS — D649 Anemia, unspecified: Secondary | ICD-10-CM | POA: Diagnosis not present

## 2018-09-19 DIAGNOSIS — I10 Essential (primary) hypertension: Secondary | ICD-10-CM | POA: Diagnosis not present

## 2018-09-19 DIAGNOSIS — Z01812 Encounter for preprocedural laboratory examination: Secondary | ICD-10-CM | POA: Insufficient documentation

## 2018-09-19 DIAGNOSIS — G2581 Restless legs syndrome: Secondary | ICD-10-CM | POA: Diagnosis not present

## 2018-09-19 DIAGNOSIS — N920 Excessive and frequent menstruation with regular cycle: Secondary | ICD-10-CM | POA: Diagnosis not present

## 2018-09-19 DIAGNOSIS — K219 Gastro-esophageal reflux disease without esophagitis: Secondary | ICD-10-CM | POA: Diagnosis not present

## 2018-09-19 DIAGNOSIS — F17210 Nicotine dependence, cigarettes, uncomplicated: Secondary | ICD-10-CM | POA: Diagnosis not present

## 2018-09-19 DIAGNOSIS — Z79899 Other long term (current) drug therapy: Secondary | ICD-10-CM | POA: Diagnosis not present

## 2018-09-19 LAB — BASIC METABOLIC PANEL
Anion gap: 10 (ref 5–15)
BUN: 18 mg/dL (ref 6–20)
CO2: 25 mmol/L (ref 22–32)
Calcium: 9.2 mg/dL (ref 8.9–10.3)
Chloride: 105 mmol/L (ref 98–111)
Creatinine, Ser: 0.91 mg/dL (ref 0.44–1.00)
GFR calc Af Amer: >60 mL/min (ref >60)
GFR calc non Af Amer: >60 mL/min (ref >60)
Glucose, Bld: 98 mg/dL (ref 70–99)
Potassium: 4.1 mmol/L (ref 3.5–5.1)
Sodium: 140 mmol/L (ref 135–145)

## 2018-09-19 LAB — CBC
HCT: 35 % — ABNORMAL LOW (ref 36.0–46.0)
Hemoglobin: 10.2 g/dL — ABNORMAL LOW (ref 12.0–15.0)
MCH: 23.8 pg — ABNORMAL LOW (ref 26.0–34.0)
MCHC: 29.1 g/dL — ABNORMAL LOW (ref 30.0–36.0)
MCV: 81.6 fL (ref 80.0–100.0)
Platelets: 386 10*3/uL (ref 150–400)
RBC: 4.29 MIL/uL (ref 3.87–5.11)
RDW: 18.7 % — ABNORMAL HIGH (ref 11.5–15.5)
WBC: 8.2 10*3/uL (ref 4.0–10.5)
nRBC: 0 % (ref 0.0–0.2)

## 2018-09-19 NOTE — Telephone Encounter (Signed)
Spoke with patient. Having some nasal congestion and non productive cough and sneezing.  No fevers.  Feels she may be developing a cold. No flu like symptoms.   Advised to treat cold symptoms can use OTC cough medication and/or nasal decongestant. Be sure that they do not contain ASA products (like alka seltzer cold products.) Pre-op nurse will tell her what she can take the morning of her surgery. Advised saline nasal spray and humidifier in her room at night for comfort measures.  Advised to call back if symptoms worsens, needs to see her PCP and procedure may be cancelled by anesthesia. Pt aware. Will call back if any worsening of symptoms and new instructions from pre-op nurse. Patient has pre-op appointment today at 1300 at surgical center.  Notified Lamont Snowball RN of patient complaints.  Routing to Dr. Quincy Simmonds for update.

## 2018-09-19 NOTE — Telephone Encounter (Signed)
Patient has surgery tomorrow and is developing cold symptoms. She is asking if this will be an issue for her surgery tomorrow?

## 2018-09-19 NOTE — Telephone Encounter (Signed)
Encounter reviewed and closed.  Preop appt with labs completed at hospital today.

## 2018-09-20 ENCOUNTER — Encounter (HOSPITAL_BASED_OUTPATIENT_CLINIC_OR_DEPARTMENT_OTHER): Payer: Self-pay | Admitting: *Deleted

## 2018-09-20 ENCOUNTER — Ambulatory Visit (HOSPITAL_BASED_OUTPATIENT_CLINIC_OR_DEPARTMENT_OTHER): Payer: 59 | Admitting: Anesthesiology

## 2018-09-20 ENCOUNTER — Encounter (HOSPITAL_BASED_OUTPATIENT_CLINIC_OR_DEPARTMENT_OTHER)
Admission: RE | Disposition: A | Discharge status: Home / Self Care | Payer: Self-pay | Source: Ambulatory Visit | Attending: Obstetrics and Gynecology

## 2018-09-20 ENCOUNTER — Ambulatory Visit (HOSPITAL_BASED_OUTPATIENT_CLINIC_OR_DEPARTMENT_OTHER)
Admission: RE | Admit: 2018-09-20 | Discharge: 2018-09-20 | Disposition: A | Discharge status: Home / Self Care | Payer: 59 | Source: Ambulatory Visit | Attending: Obstetrics and Gynecology | Admitting: Obstetrics and Gynecology

## 2018-09-20 DIAGNOSIS — D649 Anemia, unspecified: Secondary | ICD-10-CM | POA: Insufficient documentation

## 2018-09-20 DIAGNOSIS — N871 Moderate cervical dysplasia: Secondary | ICD-10-CM | POA: Insufficient documentation

## 2018-09-20 DIAGNOSIS — F329 Major depressive disorder, single episode, unspecified: Secondary | ICD-10-CM | POA: Insufficient documentation

## 2018-09-20 DIAGNOSIS — I10 Essential (primary) hypertension: Secondary | ICD-10-CM | POA: Insufficient documentation

## 2018-09-20 DIAGNOSIS — R87613 High grade squamous intraepithelial lesion on cytologic smear of cervix (HGSIL): Secondary | ICD-10-CM

## 2018-09-20 DIAGNOSIS — Z79899 Other long term (current) drug therapy: Secondary | ICD-10-CM | POA: Insufficient documentation

## 2018-09-20 DIAGNOSIS — F419 Anxiety disorder, unspecified: Secondary | ICD-10-CM | POA: Insufficient documentation

## 2018-09-20 DIAGNOSIS — K219 Gastro-esophageal reflux disease without esophagitis: Secondary | ICD-10-CM | POA: Insufficient documentation

## 2018-09-20 DIAGNOSIS — N920 Excessive and frequent menstruation with regular cycle: Secondary | ICD-10-CM | POA: Insufficient documentation

## 2018-09-20 DIAGNOSIS — F17210 Nicotine dependence, cigarettes, uncomplicated: Secondary | ICD-10-CM | POA: Insufficient documentation

## 2018-09-20 DIAGNOSIS — G2581 Restless legs syndrome: Secondary | ICD-10-CM | POA: Insufficient documentation

## 2018-09-20 HISTORY — DX: Pain in thoracic spine: M54.6

## 2018-09-20 HISTORY — DX: Iron deficiency anemia, unspecified: D50.9

## 2018-09-20 HISTORY — DX: Dyspnea, unspecified: R06.00

## 2018-09-20 HISTORY — DX: Personal history of colonic polyps: Z86.010

## 2018-09-20 HISTORY — DX: Epigastric pain: R10.13

## 2018-09-20 HISTORY — PX: CERVICAL CONIZATION W/BX: SHX1330

## 2018-09-20 HISTORY — DX: Cramp and spasm: R25.2

## 2018-09-20 HISTORY — DX: Unspecified hemorrhoids: K64.9

## 2018-09-20 HISTORY — DX: Melena: K92.1

## 2018-09-20 HISTORY — DX: Personal history of other specified conditions: Z87.898

## 2018-09-20 HISTORY — DX: Heart disease, unspecified: I51.9

## 2018-09-20 HISTORY — DX: Other fatigue: R53.83

## 2018-09-20 HISTORY — PX: DILATION AND CURETTAGE OF UTERUS: SHX78

## 2018-09-20 HISTORY — DX: Personal history of adenomatous and serrated colon polyps: Z86.0101

## 2018-09-20 HISTORY — DX: Tinnitus, left ear: H93.12

## 2018-09-20 HISTORY — DX: Gastro-esophageal reflux disease without esophagitis: K21.9

## 2018-09-20 HISTORY — DX: Restless legs syndrome: G25.81

## 2018-09-20 HISTORY — DX: Unilateral inguinal hernia, without obstruction or gangrene, not specified as recurrent: K40.90

## 2018-09-20 LAB — POCT PREGNANCY, URINE: Preg Test, Ur: NEGATIVE

## 2018-09-20 SURGERY — CONE BIOPSY, CERVIX
Anesthesia: General | Site: Uterus

## 2018-09-20 MED ORDER — IODINE STRONG (LUGOLS) 5 % PO SOLN
ORAL | Status: DC | PRN
  Administered 2018-09-20: 0.2 mL via ORAL

## 2018-09-20 MED ORDER — PROMETHAZINE HCL 25 MG/ML IJ SOLN
6.2500 mg | INTRAMUSCULAR | Status: DC | PRN
  Filled 2018-09-20: qty 1

## 2018-09-20 MED ORDER — IBUPROFEN 200 MG PO TABS: 800.0000 mg | ORAL_TABLET | Freq: Three times a day (TID) | ORAL | 0 refills | Status: DC | PRN

## 2018-09-20 MED ORDER — DEXAMETHASONE SODIUM PHOSPHATE 4 MG/ML IJ SOLN
INTRAMUSCULAR | Status: DC | PRN
  Administered 2018-09-20: 10 mg via INTRAVENOUS

## 2018-09-20 MED ORDER — PROPOFOL 10 MG/ML IV BOLUS
INTRAVENOUS | Status: AC
  Filled 2018-09-20: qty 20

## 2018-09-20 MED ORDER — HYDROMORPHONE HCL 1 MG/ML IJ SOLN
0.2500 mg | INTRAMUSCULAR | Status: DC | PRN
  Filled 2018-09-20: qty 0.5

## 2018-09-20 MED ORDER — KETOROLAC TROMETHAMINE 30 MG/ML IJ SOLN
INTRAMUSCULAR | Status: DC | PRN
  Administered 2018-09-20: 30 mg via INTRAVENOUS

## 2018-09-20 MED ORDER — LACTATED RINGERS IV SOLN
INTRAVENOUS | Status: DC
  Administered 2018-09-20: 12:00:00 via INTRAVENOUS
  Filled 2018-09-20: qty 1000

## 2018-09-20 MED ORDER — LIDOCAINE 2% (20 MG/ML) 5 ML SYRINGE
INTRAMUSCULAR | Status: AC
  Filled 2018-09-20: qty 5

## 2018-09-20 MED ORDER — FERRIC SUBSULFATE SOLN
Status: DC | PRN
  Administered 2018-09-20: 1

## 2018-09-20 MED ORDER — LIDOCAINE-EPINEPHRINE 1 %-1:100000 IJ SOLN
INTRAMUSCULAR | Status: DC | PRN
  Administered 2018-09-20: 10 mL

## 2018-09-20 MED ORDER — ONDANSETRON HCL 4 MG/2ML IJ SOLN
INTRAMUSCULAR | Status: AC
  Filled 2018-09-20: qty 2

## 2018-09-20 MED ORDER — FENTANYL CITRATE (PF) 100 MCG/2ML IJ SOLN
INTRAMUSCULAR | Status: DC | PRN
  Administered 2018-09-20 (×2): 50 ug via INTRAVENOUS

## 2018-09-20 MED ORDER — MEPERIDINE HCL 25 MG/ML IJ SOLN
6.2500 mg | INTRAMUSCULAR | Status: DC | PRN
  Filled 2018-09-20: qty 1

## 2018-09-20 MED ORDER — LIDOCAINE 2% (20 MG/ML) 5 ML SYRINGE
INTRAMUSCULAR | Status: DC | PRN
  Administered 2018-09-20: 100 mg via INTRAVENOUS

## 2018-09-20 MED ORDER — MIDAZOLAM HCL 5 MG/5ML IJ SOLN
INTRAMUSCULAR | Status: DC | PRN
  Administered 2018-09-20: 2 mg via INTRAVENOUS

## 2018-09-20 MED ORDER — OXYCODONE HCL 5 MG/5ML PO SOLN
5.0000 mg | Freq: Once | ORAL | Status: DC | PRN
  Filled 2018-09-20: qty 5

## 2018-09-20 MED ORDER — ONDANSETRON HCL 4 MG/2ML IJ SOLN
INTRAMUSCULAR | Status: DC | PRN
  Administered 2018-09-20: 4 mg via INTRAVENOUS

## 2018-09-20 MED ORDER — ACETIC ACID 4% SOLUTION
Status: DC | PRN
  Administered 2018-09-20: 1 via TOPICAL

## 2018-09-20 MED ORDER — SODIUM CHLORIDE 0.9 % IR SOLN
Status: DC | PRN
  Administered 2018-09-20: 500 mL

## 2018-09-20 MED ORDER — CEFAZOLIN SODIUM-DEXTROSE 2-3 GM-%(50ML) IV SOLR
INTRAVENOUS | Status: DC | PRN
  Administered 2018-09-20: 2 g via INTRAVENOUS

## 2018-09-20 MED ORDER — CEFAZOLIN SODIUM 1 G IJ SOLR
INTRAMUSCULAR | Status: AC
  Filled 2018-09-20: qty 20

## 2018-09-20 MED ORDER — MIDAZOLAM HCL 2 MG/2ML IJ SOLN
INTRAMUSCULAR | Status: AC
  Filled 2018-09-20: qty 2

## 2018-09-20 MED ORDER — KETOROLAC TROMETHAMINE 30 MG/ML IJ SOLN
INTRAMUSCULAR | Status: AC
  Filled 2018-09-20: qty 1

## 2018-09-20 MED ORDER — DEXAMETHASONE SODIUM PHOSPHATE 10 MG/ML IJ SOLN
INTRAMUSCULAR | Status: AC
  Filled 2018-09-20: qty 1

## 2018-09-20 MED ORDER — FENTANYL CITRATE (PF) 100 MCG/2ML IJ SOLN
INTRAMUSCULAR | Status: AC
  Filled 2018-09-20: qty 2

## 2018-09-20 MED ORDER — OXYCODONE HCL 5 MG PO TABS
5.0000 mg | ORAL_TABLET | Freq: Once | ORAL | Status: DC | PRN
  Filled 2018-09-20: qty 1

## 2018-09-20 MED ORDER — PROPOFOL 10 MG/ML IV BOLUS
INTRAVENOUS | Status: DC | PRN
  Administered 2018-09-20: 200 mg via INTRAVENOUS

## 2018-09-20 SURGICAL SUPPLY — 27 items
APPLICATOR COTTON TIP 6 STRL (MISCELLANEOUS) IMPLANT
APPLICATOR COTTON TIP 6IN STRL (MISCELLANEOUS)
BLADE SURG 11 STRL SS (BLADE) ×3 IMPLANT
CATH ROBINSON RED A/P 16FR (CATHETERS) ×3 IMPLANT
COVER WAND RF STERILE (DRAPES) ×3 IMPLANT
ELECT BALL LEEP 5MM RED (ELECTRODE) IMPLANT
ELECT REM PT RETURN 9FT ADLT (ELECTROSURGICAL) ×3
ELECTRODE REM PT RTRN 9FT ADLT (ELECTROSURGICAL) ×2 IMPLANT
GLOVE BIO SURGEON STRL SZ 6.5 (GLOVE) ×3 IMPLANT
GLOVE BIOGEL PI IND STRL 7.0 (GLOVE) ×2 IMPLANT
GLOVE BIOGEL PI INDICATOR 7.0 (GLOVE) ×1
GOWN STRL REUS W/TWL LRG LVL3 (GOWN DISPOSABLE) ×6 IMPLANT
NS IRRIG 1000ML POUR BTL (IV SOLUTION) ×3 IMPLANT
PACK VAGINAL MINOR WOMEN LF (CUSTOM PROCEDURE TRAY) ×3 IMPLANT
PAD OB MATERNITY 4.3X12.25 (PERSONAL CARE ITEMS) ×3 IMPLANT
PENCIL BUTTON HOLSTER BLD 10FT (ELECTRODE) ×3 IMPLANT
SCOPETTES 8  STERILE (MISCELLANEOUS) ×2
SCOPETTES 8 STERILE (MISCELLANEOUS) ×4 IMPLANT
SPONGE SURGIFOAM ABS GEL 12-7 (HEMOSTASIS) IMPLANT
SUT CHROMIC 1 CT1 27 (SUTURE) IMPLANT
SUT SILK 2 0 SH (SUTURE) ×3 IMPLANT
SUT VIC AB 0 CT1 27 (SUTURE) ×2
SUT VIC AB 0 CT1 27XBRD ANBCTR (SUTURE) ×4 IMPLANT
TOWEL OR 17X24 6PK STRL BLUE (TOWEL DISPOSABLE) ×6 IMPLANT
TUBE CONNECTING 12X1/4 (SUCTIONS) ×3 IMPLANT
TUBING NON-CON 1/4 X 20 CONN (TUBING) ×3 IMPLANT
YANKAUER SUCT BULB TIP NO VENT (SUCTIONS) ×3 IMPLANT

## 2018-09-20 NOTE — Op Note (Signed)
OPERATIVE REPORT  PREOPERATIVE DIAGNOSIS:  High grade squamous dysplasia.   POSTOPERATIVE DIAGNOSIS:  High grade squamous dysplasia.   PROCEDURE:    1.  Cold knife conization of the cervix. 2.  Fractional dilation and curettage.   SURGEON:  Aundria Rud, MD  ASSISTANT:  None  ANESTHESIA:  LMA, local with 1% lidocaine 15 with epinephrine 1:100,000.  IVF:   700 cc LR  EBL:  5 cc.   UO:  2 cc.  Voided prior to procedure.   COMPLICATIONS:  None.   INDICATIONS FOR THE PROCEDURE:    The patient is a 47 year old [id number] Caucasian female who presents with abnormal uterine bleeding, fibroids and anemia and an office endometrial biopsy on 08/29/18 showing squamous dysplasia that was possible high grade in nature.  A benign polyp was also noted.  The patient is preparing for potential hysterectomy.   The patient had had a normal pap and negative high risk HPV in March 2019.  She had a pap and colposcopy on 09/05/18 in follow up to her endometrial biopsy, and this showed normal cytology and negative HR HPV.  An ECC was benign.  An acetowhite lesion at the 6:00 position was noted deep inside near the cervical os, and this could not be accessed for biopsy due to her anatomy.  She also has a short cervix and has had a LEEP in the past.    A plan was made to proceed with a cold knife conization of the cervix with fractional dilation and curettage after risks, benefits, and alternatives were reviewed.     FINDINGS:    Lugols's solution demonstrated no cervical or vaginal lesions.   The uterus was sounded to 8 cm.   SPECIMEN:    The conization of the cervix was sent to pathology with a silk suture marking the 12:00 position.  The endocervical curettings and endometrial curettings were sent to pathology separately.  PROCEDURE:  The patient was re-identified in the preop hold area.  She received Ancef 2 grams IV for preop antibiotics, and she received Ted hose and PAS stockings  for DVT prophylaxis.    In the operating room, she was received an LMA anesthetic and then placed in dorsal lithotomy position in the [place] stirrups.    The patient was sterilely prepped and her bladder was emptied with an in and out catheter.   A weighted speculum and a deaver retracted were used to visualize the cervix.  The cervix was stained with Lugol's solution. The cervix was injected circumferentially with 1% lidocaine with epinephrine 1:100,000.  Figure of 8 sutures of 0/0 Vicryl were placed at the 3 and 9 o'clock positions of the cervix to ligate inferior descending branches of the uterine artery bilaterally.  The scalpel on the angled holder was used to perform the conization without difficulty.  The specimen was marked at 12:00 with a silk suture and sent to pathology.  The endocervical curettage was performed with a serrated curette and then sent to pathology. The cervix was dilated to allow the serrated curette to pass into the endometrial cavity.  The endometrium was curetted in all four quadrants, and the specimen was sent to pathology. The monopolar cautery was used to create hemostasis in the endocervical conization bed.  Monsel's was later placed, and hemostasis was then good.  All vaginal instruments were removed.    This concluded the patient's procedure.  There were no complications.  All needle, instrument, and sponge counts were correct.  The patient was escorted to the recovery room in stable and awake condition.   Aundria Rud, MD

## 2018-09-20 NOTE — H&P (Signed)
GYNECOLOGY  VISIT - History and Physical   HPI: 47 y.o.   Married  Caucasian  female   [phone] with Patient's last menstrual period was 07/05/2018 (lmp unknown).   here for  Cold knife conization and fractional dilation and curettage.   Patient with abnormal uterine bleeding and known fibroids and anemia.  Was preparing for hysterectomy and office EMB showed squamous dysplasia that was possible high grade in nature.  A benign polyp was also noted.   She had a pap and colposcopy in follow up to this and this showed normal cytology and negative HR HPV.  An ECC was benign.  An acetowhite lesion at the 6:00 position was noted deep inside near the cervical os, and this could not be accessed for biopsy due to her anatomy.  She also has a short cervix and has had a LEEP in the past.   She is currently undergoing iron infusions for anemia.   GYNECOLOGIC HISTORY: Patient's last menstrual period was 07/05/2018 (lmp unknown). Contraception: tubal. Menopausal hormone therapy:  NA Last mammogram:  01/05/18 - BI-RADS1.  Last pap smear:   09/05/18 - normal and negative HR HPV.         OB History    Gravida  3   Para  2   Term      Preterm      AB  1   Living  2     SAB  1   TAB      Ectopic      Multiple      Live Births                 Patient Active Problem List   Diagnosis Date Noted  . Iron deficiency anemia 09/12/2018  . Menorrhagia with irregular cycle 08/25/2018  . Pelvic pain 08/25/2018  . Dyspareunia in female 08/25/2018  . Vertigo 09/15/2017    Past Medical History:  Diagnosis Date  . Abnormal Pap smear of cervix    2010-2014 abnormal pap every 3-6 months   dysplastic squamous cells of cervix   . Anxiety    occ.  Marland Kitchen Aortic insufficiency 08/2017   trivial aortic insufficiency, noted on ECHO  . Blood in stool   . Depression   . DUB (dysfunctional uterine bleeding)   . Dyspnea    due to anemia  . Endometriosis   . Epigastric pain   . Fatigue   .  Fibroid   . GERD (gastroesophageal reflux disease)   . Grade I diastolic dysfunction 17/40/8144   Noted on ECHO  . Hemorrhoid    2nd and 3rd degree  . History of adenomatous polyp of colon   . History of palpitations   . Hypertension    not currently taking medication  . Infertility, female   . Iron deficiency anemia    Last iron infusion 09/14/2018  . Left groin hernia    history of  . Muscle cramping    with exertion  . Recurrent bacterial infection    Vaginal  . Restless leg    due to anemia  . Thoracic back pain    while trying to sleep  . Tinnitus, left    hears heart beat, MRI normal  . Vertigo     Past Surgical History:  Procedure Laterality Date  . ABLATION ON ENDOMETRIOSIS  1995  . CERVICAL BIOPSY  W/ LOOP ELECTRODE EXCISION    . COLONOSCOPY  06/2018  . COLPOSCOPY     x 3  .  DILATION AND CURETTAGE OF UTERUS  1993  . GYNECOLOGIC CRYOSURGERY    . HEMORRHOID BANDING     several  . HERNIA REPAIR Left    childhood  . INTRAUTERINE DEVICE (IUD) INSERTION  2013    (Mirena)  . INTRAUTERINE DEVICE INSERTION  2000   [facility]  . IUD REMOVAL  2010  . IUD REMOVAL  2018  . TUBAL LIGATION     2010  . UPPER GI ENDOSCOPY  06/2018  . WISDOM TOOTH EXTRACTION      Current Facility-Administered Medications  Medication Dose Route Frequency Provider Last Rate Last Dose  . lactated ringers infusion   Intravenous Continuous Nunzio Cobbs, MD 125 mL/hr at 09/20/18 1209       ALLERGIES: Patient has no known allergies.  Family History  Problem Relation Age of Onset  . Cancer Mother 90       Dec.Stage IV colon ca 10/2013--age 36  . Heart disease Father   . Heart disease Paternal Grandfather   . Other Paternal Grandfather        Dec MVA  . Other Brother        Dec in a MVA age 38  . Alzheimer's disease Maternal Grandfather        Dec  . Other Maternal Grandfather   . Other Paternal Grandmother        Dec MVA  . Heart failure Paternal Aunt        Stage 4  colon cancer    Social History   Socioeconomic History  . Marital status: Married    Spouse name: Not on file  . Number of children: Not on file  . Years of education: Not on file  . Highest education level: Not on file  Occupational History  . Not on file  Social Needs  . Financial resource strain: Not on file  . Food insecurity:    Worry: Not on file    Inability: Not on file  . Transportation needs:    Medical: Not on file    Non-medical: Not on file  Tobacco Use  . Smoking status: Current Every Day Smoker    Packs/day: 0.50    Years: 35.00    Pack years: 17.50    Types: Cigarettes  . Smokeless tobacco: Never Used  Substance and Sexual Activity  . Alcohol use: Yes    Alcohol/week: 5.0 standard drinks    Types: 5 Glasses of wine per week    Comment: 6-7 drinks a week (alcohol)  Red Bull and Vodka or red wine  . Drug use: Not Currently  . Sexual activity: Yes    Partners: Male    Birth control/protection: Surgical    Comment: BTL  Lifestyle  . Physical activity:    Days per week: Not on file    Minutes per session: Not on file  . Stress: Not on file  Relationships  . Social connections:    Talks on phone: Not on file    Gets together: Not on file    Attends religious service: Not on file    Active member of club or organization: Not on file    Attends meetings of clubs or organizations: Not on file    Relationship status: Not on file  . Intimate partner violence:    Fear of current or ex partner: Not on file    Emotionally abused: Not on file    Physically abused: Not on file    Forced sexual  activity: Not on file  Other Topics Concern  . Not on file  Social History Narrative  . Not on file    Review of Systems  PHYSICAL EXAMINATION:    BP (!) 145/78   Pulse 70   Temp 98.9 F (37.2 C) (Oral)   Resp 16   Ht 5\' 7"  (1.702 m)   Wt 104 kg   LMP 07/05/2018 (LMP Unknown)   SpO2 99%   BMI 35.91 kg/m     General appearance: alert, cooperative and  appears stated age Head: Normocephalic, without obvious abnormality, atraumatic Lungs: clear to auscultation bilaterally Heart: regular rate and rhythm Abdomen: soft, non-tender, no masses,  no organomegaly Extremities: extremities normal, atraumatic, no cyanosis or edema Neurologic: Grossly normal  Pelvic:  Deferred to OR.            UPT negative. Hgb 10.2.   ASSESSMENT  High grade squamous dysplasia.  Symptomatic fibroids with anemia.   PLAN  Proceed with cold knife conization of the cervix and fractional dilation and curettage.  Risks of procedure may include but are not limited to bleeding, infection, damage to surrounding organs, need for additional treatment and reoperation, reaction to anesthesia, thromboembolic events. Patient wishes to proceed.    An After Visit Summary was printed and given to the patient.

## 2018-09-20 NOTE — Anesthesia Procedure Notes (Signed)
Procedure Name: LMA Insertion Date/Time: 09/20/2018 1:48 PM Performed by: Lynda Rainwater, MD Pre-anesthesia Checklist: Patient identified, Emergency Drugs available, Suction available and Patient being monitored Patient Re-evaluated:Patient Re-evaluated prior to induction Oxygen Delivery Method: Circle system utilized Preoxygenation: Pre-oxygenation with 100% oxygen Induction Type: IV induction Ventilation: Mask ventilation without difficulty LMA: LMA inserted LMA Size: 4.0 Number of attempts: 1 Airway Equipment and Method: Bite block Placement Confirmation: positive ETCO2 Tube secured with: Tape Dental Injury: Teeth and Oropharynx as per pre-operative assessment

## 2018-09-20 NOTE — Discharge Instructions (Signed)

## 2018-09-20 NOTE — Transfer of Care (Signed)
   Last Vitals:  Vitals Value Taken Time  BP 130/75 09/20/2018  2:36 PM  Temp    Pulse 87 09/20/2018  2:37 PM  Resp 12 09/20/2018  2:37 PM  SpO2 100 % 09/20/2018  2:37 PM  Vitals shown include unvalidated device data.  Last Pain:  Vitals:   09/20/18 1120  TempSrc: Oral         Immediate Anesthesia Transfer of Care Note  Patient: Andrea [name]  Procedure(s) Performed: Procedure(s) (LRB): CONIZATION CERVIX WITH BIOPSY (N/A) FRACTIONAL DILATATION AND CURETTAGE (N/A)  Patient Location: PACU  Anesthesia Type: General  Level of Consciousness: awake, alert  and oriented  Airway & Oxygen Therapy: Patient Spontanous Breathing and Patient connected to nasal cannula  oxygen  Post-op Assessment: Report given to PACU RN and Post -op Vital signs reviewed and stable  Post vital signs: Reviewed and stable  Complications: No apparent anesthesia complications

## 2018-09-20 NOTE — Anesthesia Preprocedure Evaluation (Signed)
Anesthesia Evaluation  Patient identified by MRN, date of birth, ID band Patient awake    Reviewed: Allergy & Precautions, NPO status , Patient's Chart, lab work & pertinent test results  Airway Mallampati: II  TM Distance: >3 FB Neck ROM: Full    Dental no notable dental hx.    Pulmonary neg pulmonary ROS, Current Smoker,    Pulmonary exam normal breath sounds clear to auscultation       Cardiovascular hypertension, negative cardio ROS Normal cardiovascular exam Rhythm:Regular Rate:Normal     Neuro/Psych Anxiety Depression negative neurological ROS  negative psych ROS   GI/Hepatic Neg liver ROS, GERD  ,  Endo/Other  negative endocrine ROS  Renal/GU negative Renal ROS  negative genitourinary   Musculoskeletal negative musculoskeletal ROS (+)   Abdominal (+) + obese,   Peds negative pediatric ROS (+)  Hematology negative hematology ROS (+) anemia ,   Anesthesia Other Findings   Reproductive/Obstetrics negative OB ROS                             Anesthesia Physical Anesthesia Plan  ASA: II  Anesthesia Plan: General   Post-op Pain Management:    Induction: Intravenous  PONV Risk Score and Plan: 2 and Ondansetron and Midazolam  Airway Management Planned: LMA  Additional Equipment:   Intra-op Plan:   Post-operative Plan: Extubation in OR  Informed Consent: I have reviewed the patients History and Physical, chart, labs and discussed the procedure including the risks, benefits and alternatives for the proposed anesthesia with the patient or authorized representative who has indicated his/her understanding and acceptance.   Dental advisory given  Plan Discussed with: CRNA  Anesthesia Plan Comments:         Anesthesia Quick Evaluation

## 2018-09-21 ENCOUNTER — Inpatient Hospital Stay: Payer: 59

## 2018-09-21 VITALS — BP 140/78 | HR 82 | Temp 98.6°F | Resp 18

## 2018-09-21 DIAGNOSIS — D509 Iron deficiency anemia, unspecified: Secondary | ICD-10-CM

## 2018-09-21 MED ORDER — SODIUM CHLORIDE 0.9 % IV SOLN
510.0000 mg | Freq: Once | INTRAVENOUS | Status: AC
  Administered 2018-09-21: 510 mg via INTRAVENOUS
  Filled 2018-09-21: qty 17

## 2018-09-21 MED ORDER — SODIUM CHLORIDE 0.9 % IV SOLN
Freq: Once | INTRAVENOUS | Status: AC
  Administered 2018-09-21: 14:00:00 via INTRAVENOUS
  Filled 2018-09-21: qty 250

## 2018-09-23 NOTE — Anesthesia Postprocedure Evaluation (Signed)
Anesthesia Post Note  Patient: Andrea [name]  Procedure(s) Performed: CONIZATION CERVIX WITH BIOPSY (N/A Cervix) FRACTIONAL DILATATION AND CURETTAGE (N/A Uterus)     Patient location during evaluation: PACU Anesthesia Type: General Level of consciousness: awake and alert Pain management: pain level controlled Vital Signs Assessment: post-procedure vital signs reviewed and stable Respiratory status: spontaneous breathing, nonlabored ventilation and respiratory function stable Cardiovascular status: blood pressure returned to baseline and stable Postop Assessment: no apparent nausea or vomiting Anesthetic complications: no    Last Vitals:  Vitals:   09/20/18 1500 09/20/18 1537  BP: 119/62 140/85  Pulse: 78 80  Resp: 16 16  Temp:  37.3 C  SpO2: 95% 97%    Last Pain:  Vitals:   09/21/18 0956  TempSrc:   PainSc: 0-No pain   Pain Goal:                 Lynda Rainwater

## 2018-09-26 ENCOUNTER — Encounter (HOSPITAL_BASED_OUTPATIENT_CLINIC_OR_DEPARTMENT_OTHER): Payer: Self-pay | Admitting: Obstetrics and Gynecology

## 2018-09-27 ENCOUNTER — Other Ambulatory Visit: Payer: Self-pay

## 2018-09-27 ENCOUNTER — Inpatient Hospital Stay: Payer: 59 | Attending: Oncology | Admitting: Oncology

## 2018-09-27 ENCOUNTER — Telehealth: Payer: Self-pay | Admitting: *Deleted

## 2018-09-27 VITALS — BP 168/97 | HR 90 | Temp 98.3°F | Resp 18 | Wt 230.9 lb

## 2018-09-27 DIAGNOSIS — F17210 Nicotine dependence, cigarettes, uncomplicated: Secondary | ICD-10-CM | POA: Diagnosis not present

## 2018-09-27 DIAGNOSIS — Z79899 Other long term (current) drug therapy: Secondary | ICD-10-CM | POA: Diagnosis not present

## 2018-09-27 DIAGNOSIS — D509 Iron deficiency anemia, unspecified: Secondary | ICD-10-CM | POA: Insufficient documentation

## 2018-09-27 DIAGNOSIS — Z90710 Acquired absence of both cervix and uterus: Secondary | ICD-10-CM | POA: Diagnosis not present

## 2018-09-27 DIAGNOSIS — I10 Essential (primary) hypertension: Secondary | ICD-10-CM | POA: Diagnosis not present

## 2018-09-27 NOTE — Progress Notes (Signed)
Here for  follow up. Mild sob noted after walking from downstairs- using stairs- pulse ox 98% on RA. Stated she has sob walking 50 yards to mailbox- at home.   Stated feeling tired ,and low energy -on going .

## 2018-09-27 NOTE — Telephone Encounter (Signed)
Patient notified of result as directed by Dr Quincy Simmonds. Desires to proceed with hysterectomy as previously planned.  Would like earliest date available after recovery from conization. Advised will schedule and call back with date.   Encounter closed.

## 2018-09-27 NOTE — Progress Notes (Signed)
Hematology/Oncology Consult note [facility]  [phone] [phone]  Patient Care Team: Jilda Panda, MD as PCP - General (Internal Medicine) Nahser, Wonda Cheng, MD as PCP - Cardiology (Cardiology)   Name of the patient: Andrea [name]  [id number]  10-Feb-1971   Date of visit: 09/27/18  Diagnosis- iron deficiency anemia  Chief complaint/ Reason for visit- routine f/u of iron deficiency anemia  Heme/Onc history: Patient is a point female who has been referred to [place] for iron deficiency anemia.  Her blood work from August 2019 showed H&H of 12.6/38.8 with an MCV of 90.7.  White count and platelet count were normal.  Patient reports that since then she has had a recent hemoglobin checked 2 to 3 weeks ago when her hemoglobin was 9.  She has been taking oral iron on and off for the last couple of months but does report significant constipation and abdominal pain secondary to it.  She did undergo EGD and colonoscopy which was apparently unremarkable.  Patient also has a history of abnormal Pap smear and is scheduled to undergo colposcopy soon.  She has been having heavy menstrual.'s and wanted to initially undergo hysterectomy which has been on hold due to her upcoming colposcopy.  She reports feeling significantly fatigued at work but denies other complaints.  Patient's mother had colon cancer at 30.  Patient denies any changes in her appetite or unintentional weight loss.  Denies any blood loss in her stool or urine.  Denies any dark melanotic stools  Blood work from 10/19/2018 were as follows: CBC showed white count of 2, H&H of 10.2/35 with an MCV of 81 and a platelet count of 386.  Ferritin levels were low at 9 and iron study showed a low iron saturation of 4% elevated TIBC of 4 9.  B12 and folate were within normal limits.  Urinalysis did not reveal hematuria.  Stool H. pylori antigen was negative.  Interval history- Patient feels a little better in  terms of her energy levels but not back not her baseline yet  ECOG PS- 0 Pain scale- 0 Opioid associated constipation- no  Review of systems- Review of Systems  Constitutional: Positive for malaise/fatigue. Negative for chills, fever and weight loss.  HENT: Negative for congestion, ear discharge and nosebleeds.   Eyes: Negative for blurred vision.  Respiratory: Negative for cough, hemoptysis, sputum production, shortness of breath and wheezing.   Cardiovascular: Negative for chest pain, palpitations, orthopnea and claudication.  Gastrointestinal: Negative for abdominal pain, blood in stool, constipation, diarrhea, heartburn, melena, nausea and vomiting.  Genitourinary: Negative for dysuria, flank pain, frequency, hematuria and urgency.  Musculoskeletal: Negative for back pain, joint pain and myalgias.  Skin: Negative for rash.  Neurological: Negative for dizziness, tingling, focal weakness, seizures, weakness and headaches.  Endo/Heme/Allergies: Does not bruise/bleed easily.  Psychiatric/Behavioral: Negative for depression and suicidal ideas. The patient does not have insomnia.       No Known Allergies   Past Medical History:  Diagnosis Date  . Abnormal Pap smear of cervix    2010-2014 abnormal pap every 3-6 months   dysplastic squamous cells of cervix   . Anxiety    occ.  Marland Kitchen Aortic insufficiency 08/2017   trivial aortic insufficiency, noted on ECHO  . Blood in stool   . Depression   . DUB (dysfunctional uterine bleeding)   . Dyspnea    due to anemia  . Endometriosis   . Epigastric pain   . Fatigue   .  Fibroid   . GERD (gastroesophageal reflux disease)   . Grade I diastolic dysfunction 31/49/7026   Noted on ECHO  . Hemorrhoid    2nd and 3rd degree  . History of adenomatous polyp of colon   . History of palpitations   . Hypertension    not currently taking medication  . Infertility, female   . Iron deficiency anemia    Last iron infusion 09/14/2018  . Left groin  hernia    history of  . Muscle cramping    with exertion  . Recurrent bacterial infection    Vaginal  . Restless leg    due to anemia  . Thoracic back pain    while trying to sleep  . Tinnitus, left    hears heart beat, MRI normal  . Vertigo      Past Surgical History:  Procedure Laterality Date  . ABLATION ON ENDOMETRIOSIS  1995  . CERVICAL BIOPSY  W/ LOOP ELECTRODE EXCISION    . CERVICAL CONIZATION W/BX N/A 09/20/2018   Procedure: CONIZATION CERVIX WITH BIOPSY;  Surgeon: Nunzio Cobbs, MD;  Location: [facility];  Service: Gynecology;  Laterality: N/A;  . COLONOSCOPY  06/2018  . COLPOSCOPY     x 3  . DILATION AND CURETTAGE OF UTERUS  1993  . DILATION AND CURETTAGE OF UTERUS N/A 09/20/2018   Procedure: FRACTIONAL DILATATION AND CURETTAGE;  Surgeon: Nunzio Cobbs, MD;  Location: [facility];  Service: Gynecology;  Laterality: N/A;  . GYNECOLOGIC CRYOSURGERY    . HEMORRHOID BANDING     several  . HERNIA REPAIR Left    childhood  . INTRAUTERINE DEVICE (IUD) INSERTION  2013    (Mirena)  . INTRAUTERINE DEVICE INSERTION  2000   [facility]  . IUD REMOVAL  2010  . IUD REMOVAL  2018  . TUBAL LIGATION     2010  . UPPER GI ENDOSCOPY  06/2018  . WISDOM TOOTH EXTRACTION      Social History   Socioeconomic History  . Marital status: Married    Spouse name: Not on file  . Number of children: Not on file  . Years of education: Not on file  . Highest education level: Not on file  Occupational History  . Not on file  Social Needs  . Financial resource strain: Not on file  . Food insecurity:    Worry: Not on file    Inability: Not on file  . Transportation needs:    Medical: Not on file    Non-medical: Not on file  Tobacco Use  . Smoking status: Current Every Day Smoker    Packs/day: 0.50    Years: 35.00    Pack years: 17.50    Types: Cigarettes  . Smokeless tobacco: Never Used  Substance and Sexual Activity  .  Alcohol use: Yes    Alcohol/week: 5.0 standard drinks    Types: 5 Glasses of wine per week    Comment: 6-7 drinks a week (alcohol)  Red Bull and Vodka or red wine  . Drug use: Not Currently  . Sexual activity: Yes    Partners: Male    Birth control/protection: Surgical    Comment: BTL  Lifestyle  . Physical activity:    Days per week: Not on file    Minutes per session: Not on file  . Stress: Not on file  Relationships  . Social connections:    Talks on phone: Not on file  Gets together: Not on file    Attends religious service: Not on file    Active member of club or organization: Not on file    Attends meetings of clubs or organizations: Not on file    Relationship status: Not on file  . Intimate partner violence:    Fear of current or ex partner: Not on file    Emotionally abused: Not on file    Physically abused: Not on file    Forced sexual activity: Not on file  Other Topics Concern  . Not on file  Social History Narrative  . Not on file    Family History  Problem Relation Age of Onset  . Cancer Mother 53       Dec.Stage IV colon ca 10/2013--age 63  . Heart disease Father   . Heart disease Paternal Grandfather   . Other Paternal Grandfather        Dec MVA  . Other Brother        Dec in a MVA age 34  . Alzheimer's disease Maternal Grandfather        Dec  . Other Maternal Grandfather   . Other Paternal Grandmother        Dec MVA  . Heart failure Paternal Aunt        Stage 4 colon cancer     Current Outpatient Medications:  .  omeprazole (PRILOSEC) 40 MG capsule, Take 40 mg by mouth every morning. , Disp: , Rfl:  .  clobetasol cream (TEMOVATE) 9.32 %, Apply 1 application topically daily as needed (for irritation)., Disp: , Rfl:  .  ibuprofen (ADVIL,MOTRIN) 200 MG tablet, Take 4 tablets (800 mg total) by mouth every 8 (eight) hours as needed for headache or moderate pain. (Patient not taking: Reported on 09/27/2018), Disp: 30 tablet, Rfl: 0  Physical exam:    Vitals:   09/27/18 1407  BP: (!) 168/97  Pulse: 90  Resp: 18  Temp: 98.3 F (36.8 C)  TempSrc: Tympanic  Weight: 230 lb 14.4 oz (104.7 kg)   Physical Exam  Constitutional: She is oriented to person, place, and time. She appears well-developed and well-nourished.  HENT:  Head: Normocephalic and atraumatic.  Eyes: Pupils are equal, round, and reactive to light. EOM are normal.  Neck: Normal range of motion.  Cardiovascular: Normal rate, regular rhythm and normal heart sounds.  Pulmonary/Chest: Effort normal and breath sounds normal.  Abdominal: Soft. Bowel sounds are normal.  Neurological: She is alert and oriented to person, place, and time.  Skin: Skin is warm and dry.     CMP Latest Ref Rng & Units 09/19/2018  Glucose 70 - 99 mg/dL 98  BUN 6 - 20 mg/dL 18  Creatinine 0.44 - 1.00 mg/dL 0.91  Sodium 135 - 145 mmol/L 140  Potassium 3.5 - 5.1 mmol/L 4.1  Chloride 98 - 111 mmol/L 105  CO2 22 - 32 mmol/L 25  Calcium 8.9 - 10.3 mg/dL 9.2   CBC Latest Ref Rng & Units 09/19/2018  WBC 4.0 - 10.5 K/uL 8.2  Hemoglobin 12.0 - 15.0 g/dL 10.2(L)  Hematocrit 36.0 - 46.0 % 35.0(L)  Platelets 150 - 400 K/uL 386      Assessment and plan- Patient is a 46 y.o. female with history of iron deficiency anemia here to discuss the results of her work-up  Results of anemia work-up were consistent with iron deficiency.  Etiology of her iron deficiency is likely due to menorrhagia.  Patient will hopefully be undergoing a  hysterectomy in the next few months after her colposcopy results are back.  She already received 2 doses of IV iron and tolerated it well without any significant side effects.  We will repeat CBC ferritin and iron studies in 2 months and I will see him back in 5 months with CBC ferritin and iron studies.  If she remains anemic despite a hysterectomy she will need to speak to GI about getting a capsule endoscopy at that time   Visit Diagnosis 1. Iron deficiency anemia, unspecified  iron deficiency anemia type      Dr. Randa Evens, MD, MPH [facility] [phone] 09/27/2018 2:11 PM

## 2018-09-27 NOTE — Telephone Encounter (Signed)
Call to patient. Per ROI, can leave message on voice mail which has name confirmation. Left message to call back. No emergency.

## 2018-09-27 NOTE — Telephone Encounter (Signed)
-----   Message from Nunzio Cobbs, MD sent at 09/27/2018  5:08 AM EST ----- Please report surgical pathology results to patient for her conization of the cervix.  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  She did have a positive margin of the conization.  The endometrial curettage was negative and normal.   Options for care with the positive margin are:  Pap and ECC in 3 months, repeat conization, or hysterectomy.   Patient has been on a pathway for hysterectomy for fibroids and anemia, and we needed to do the conization first due to her squamous dysplasia.   The hysterectomy continues to be a reasonable choice.

## 2018-09-28 ENCOUNTER — Encounter: Payer: Self-pay | Admitting: Oncology

## 2018-09-29 ENCOUNTER — Telehealth: Payer: Self-pay | Admitting: Obstetrics and Gynecology

## 2018-09-29 NOTE — Telephone Encounter (Signed)
Spoke with patient regarding benefit for surgery. Patient understood and agreeable. Patient has confirmed and is ready to schedule. Patient aware this is professional benefit only. Patient aware will be contacted by hospital for separate benefits. Forwarding to Conservation officer, historic buildings for scheduling  Routing to Lamont Snowball, RN

## 2018-10-06 NOTE — Telephone Encounter (Signed)
Patient is calling to speak with Gay Filler about scheduling surgery.

## 2018-10-10 NOTE — Telephone Encounter (Signed)
Call to patient to discuss date of 11-29-18. Patient states this is the day before 48th birthday. Requests any other alternative.  Advised will review and call back.

## 2018-10-14 NOTE — Telephone Encounter (Signed)
Call to patient. Advised of new surgery date option of 11-14-18. Patient agreeable to this date.   Surgery scheduled and confirmed for 11-14-18 at 1015 at [facility]. Surgery instruction sheet reviewed and printed copy will be mailed as well as provided at surgery consult on 10-28-18 with hospital brochure. Instructions for bowel prep reviewed.   Routing to Dr Quincy Simmonds. Encounter closed.

## 2018-10-21 ENCOUNTER — Ambulatory Visit: Payer: 59 | Admitting: Obstetrics and Gynecology

## 2018-10-27 NOTE — Progress Notes (Signed)
GYNECOLOGY  VISIT   HPI: 48 y.o.   Married  Caucasian  female   [phone] with Patient's last menstrual period was 07/05/2018 (exact date).   here for surgery consult for Hysterectomy  She has fibroids, heavy bleeding, anemia, and cervical dysplasia.  Her pelvic US showed several small fibroids, largest 2.66 cm.  Her cervical dysplasia was detected with her preop endometrial biopsy showing squamous dysplasia of the cervix.  She had a follow up colposcopy and then cervical conization 09/20/18 showing CIN II with a positive margin and an ECC positive for CIN II.  Her concurrent intraop endometrial curettage showed benign endometrium.  Hgb 10.2 on 09/19/18.  Has done 2 iron infusions and is feeling much better No menses since 10/28/17, and asking about her hormonal status.  Did have intercourse once since her conization.   She declines future childbearing, and has had a tubal ligation. Having hot flashes.  She has a history of endometriosis.   Occasional leak of urine with cough or jumping on a trampoline.  She does not consider this a problem or want surgery for this.   GYNECOLOGIC HISTORY: Patient's last menstrual period was 07/05/2018 (exact date). Contraception:  [place] Menopausal hormone therapy:  none Last mammogram:  03/15/18 BIRADS 1 negative/density b Last pap smear: 08/29/18 dysplastic squamous cells of cervix; 09/05/18 Neg:Neg HR HPV; 09/05/18 ECC Negative; 09/20/18 CIN II w/ positive margin of the conization -- endometrial curettage negative and normal        OB History    Gravida  3   Para  2   Term      Preterm      AB  1   Living  2     SAB  1   TAB      Ectopic      Multiple      Live Births                 Patient Active Problem List   Diagnosis Date Noted  . Iron deficiency anemia 09/12/2018  . Menorrhagia with irregular cycle 08/25/2018  . Pelvic pain 08/25/2018  . Dyspareunia in female 08/25/2018  . Vertigo 09/15/2017    Past Medical  History:  Diagnosis Date  . Abnormal Pap smear of cervix    2010-2014 abnormal pap every 3-6 months   dysplastic squamous cells of cervix   . Anxiety    occ.  Marland Kitchen Aortic insufficiency 08/2017   trivial aortic insufficiency, noted on ECHO  . Blood in stool   . Depression   . DUB (dysfunctional uterine bleeding)   . Dyspnea    due to anemia  . Endometriosis   . Epigastric pain   . Fatigue   . Fibroid   . GERD (gastroesophageal reflux disease)   . Grade I diastolic dysfunction 34/74/2595   Noted on ECHO  . Hemorrhoid    2nd and 3rd degree  . History of adenomatous polyp of colon   . History of palpitations   . Hypertension    not currently taking medication  . Infertility, female   . Iron deficiency anemia    Last iron infusion 09/14/2018  . Left groin hernia    history of  . Muscle cramping    with exertion  . Recurrent bacterial infection    Vaginal  . Restless leg    due to anemia  . Thoracic back pain    while trying to sleep  . Tinnitus, left    hears heart  beat, MRI normal  . Vertigo     Past Surgical History:  Procedure Laterality Date  . ABLATION ON ENDOMETRIOSIS  1995  . CERVICAL BIOPSY  W/ LOOP ELECTRODE EXCISION    . CERVICAL CONIZATION W/BX N/A 09/20/2018   Procedure: CONIZATION CERVIX WITH BIOPSY;  Surgeon: Nunzio Cobbs, MD;  Location: [facility];  Service: Gynecology;  Laterality: N/A;  . COLONOSCOPY  06/2018  . COLPOSCOPY     x 3  . DILATION AND CURETTAGE OF UTERUS  1993  . DILATION AND CURETTAGE OF UTERUS N/A 09/20/2018   Procedure: FRACTIONAL DILATATION AND CURETTAGE;  Surgeon: Nunzio Cobbs, MD;  Location: [facility];  Service: Gynecology;  Laterality: N/A;  . GYNECOLOGIC CRYOSURGERY    . HEMORRHOID BANDING     several  . HERNIA REPAIR Left    childhood  . INTRAUTERINE DEVICE (IUD) INSERTION  2013    (Mirena)  . INTRAUTERINE DEVICE INSERTION  2000   [facility]  . IUD REMOVAL  2010  .  IUD REMOVAL  2018  . TUBAL LIGATION     2010  . UPPER GI ENDOSCOPY  06/2018  . WISDOM TOOTH EXTRACTION      Current Outpatient Medications  Medication Sig Dispense Refill  . clobetasol cream (TEMOVATE) 0.93 % Apply 1 application topically daily as needed (for irritation).    Marland Kitchen ibuprofen (ADVIL,MOTRIN) 200 MG tablet Take 4 tablets (800 mg total) by mouth every 8 (eight) hours as needed for headache or moderate pain. 30 tablet 0  . omeprazole (PRILOSEC) 40 MG capsule Take 40 mg by mouth every morning.      No current facility-administered medications for this visit.      ALLERGIES: Patient has no known allergies.  Family History  Problem Relation Age of Onset  . Cancer Mother 62       Dec.Stage IV colon ca 10/2013--age 71  . Heart disease Father   . Heart disease Paternal Grandfather   . Other Paternal Grandfather        Dec MVA  . Other Brother        Dec in a MVA age 9  . Alzheimer's disease Maternal Grandfather        Dec  . Other Maternal Grandfather   . Other Paternal Grandmother        Dec MVA  . Heart failure Paternal Aunt        Stage 4 colon cancer    Social History   Socioeconomic History  . Marital status: Married    Spouse name: Not on file  . Number of children: Not on file  . Years of education: Not on file  . Highest education level: Not on file  Occupational History  . Not on file  Social Needs  . Financial resource strain: Not on file  . Food insecurity:    Worry: Not on file    Inability: Not on file  . Transportation needs:    Medical: Not on file    Non-medical: Not on file  Tobacco Use  . Smoking status: Current Every Day Smoker    Packs/day: 0.50    Years: 35.00    Pack years: 17.50    Types: Cigarettes  . Smokeless tobacco: Never Used  Substance and Sexual Activity  . Alcohol use: Yes    Alcohol/week: 5.0 standard drinks    Types: 5 Glasses of wine per week    Comment: 6-7 drinks a week (  alcohol)  Red Bull and Vodka or red wine   . Drug use: Not Currently  . Sexual activity: Yes    Partners: Male    Birth control/protection: Surgical    Comment: BTL  Lifestyle  . Physical activity:    Days per week: Not on file    Minutes per session: Not on file  . Stress: Not on file  Relationships  . Social connections:    Talks on phone: Not on file    Gets together: Not on file    Attends religious service: Not on file    Active member of club or organization: Not on file    Attends meetings of clubs or organizations: Not on file    Relationship status: Not on file  . Intimate partner violence:    Fear of current or ex partner: Not on file    Emotionally abused: Not on file    Physically abused: Not on file    Forced sexual activity: Not on file  Other Topics Concern  . Not on file  Social History Narrative  . Not on file    Review of Systems  All other systems reviewed and are negative.   PHYSICAL EXAMINATION:    BP 120/68 (BP Location: Right Arm, Patient Position: Sitting, Cuff Size: Large)   Pulse 80   Resp 16   Wt 230 lb 9.6 oz (104.6 kg)   LMP 07/05/2018 (Exact Date)   BMI 36.12 kg/m     General appearance: alert, cooperative and appears stated age Head: Normocephalic, without obvious abnormality, atraumatic Neck: no adenopathy, supple, symmetrical, trachea midline and thyroid normal to inspection and palpation Lungs: clear to auscultation bilaterally Heart: regular rate and rhythm Abdomen: soft, non-tender, no masses,  no organomegaly.  Possible tiny umbilical hernia.  Extremities: extremities normal, atraumatic, no cyanosis or edema Skin: Skin color, texture, turgor normal. No rashes or lesions No abnormal inguinal nodes palpated Neurologic: Grossly normal  Pelvic: External genitalia:  no lesions              Urethra:  normal appearing urethra with no masses, tenderness or lesions              Bartholins and Skenes: normal                 Vagina: normal appearing vagina with normal color  and discharge, no lesions              Cervix: no lesions                Bimanual Exam:  Uterus:  normal size, contour, position, consistency, mobility, non-tender              Adnexa: no mass, fullness, tenderness             Chaperone was present for exam.  ASSESSMENT  Menorrhagia and fibroids.  Now amenorrheic. Hx pelvic pain and dyspareunia. Anemia. Treated with iron infusions.  Cervical dysplasia.  Status post prior LEEP and recent cold knife conization with fractional dilation and curettage.  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 Hx endometriosis. FH of cardiovascular disease.  Anxiety. Smoker.  PLAN  We had a detailed discussed regarding her cervical dysplasia and positive margins.  We reviewed her alternatives of pap and ECC in 4 months, repeat excision of the dysplasia, and proceeding with hysterectomy.   Will check CBC, FSH and estradiol levels.   I discussed total laparoscopic hysterectomy with bilateral salpingectomy and possible  bilateral oophorectomy, cystoscopy.   Will keep her ovaries if normal.  I reviewed risks, benefits, and alternatives.  Risks include but are not limited to bleeding, infection, damage to surrounding organs, pneumonia, reaction to anesthesia, DVT, PE, death, need for reoperation, hernia formation, vaginal cuff dehiscence, and neuropathy.   Surgical expectations and recovery discussed.  Patient wishes to proceed with hysterectomy as outlined above.  She will do a magnesium citrate bowel prep.  She will receive Lovenox for DVT/PE prophylaxis.  Questions invited and answered.  She will do a flu vaccine if not already done.   She will try to decrease her use of tobacco.   An After Visit Summary was printed and given to the patient.  __25____ minutes face to face time of which over 50% was spent in counseling.

## 2018-10-28 ENCOUNTER — Ambulatory Visit (INDEPENDENT_AMBULATORY_CARE_PROVIDER_SITE_OTHER): Payer: 59 | Admitting: Obstetrics and Gynecology

## 2018-10-28 ENCOUNTER — Encounter: Payer: Self-pay | Admitting: Obstetrics and Gynecology

## 2018-10-28 ENCOUNTER — Other Ambulatory Visit: Payer: Self-pay

## 2018-10-28 VITALS — BP 120/68 | HR 80 | Resp 16 | Wt 230.6 lb

## 2018-10-28 DIAGNOSIS — N871 Moderate cervical dysplasia: Secondary | ICD-10-CM | POA: Diagnosis not present

## 2018-10-28 DIAGNOSIS — D649 Anemia, unspecified: Secondary | ICD-10-CM | POA: Diagnosis not present

## 2018-10-28 DIAGNOSIS — D219 Benign neoplasm of connective and other soft tissue, unspecified: Secondary | ICD-10-CM | POA: Diagnosis not present

## 2018-10-28 DIAGNOSIS — N951 Menopausal and female climacteric states: Secondary | ICD-10-CM | POA: Diagnosis not present

## 2018-10-29 LAB — CBC
Hematocrit: 42.8 % (ref 34.0–46.6)
Hemoglobin: 13.6 g/dL (ref 11.1–15.9)
MCH: 27.4 pg (ref 26.6–33.0)
MCHC: 31.8 g/dL (ref 31.5–35.7)
MCV: 86 fL (ref 79–97)
Platelets: 291 10*3/uL (ref 150–450)
RBC: 4.97 x10E6/uL (ref 3.77–5.28)
WBC: 6.3 10*3/uL (ref 3.4–10.8)

## 2018-10-29 LAB — ESTRADIOL: Estradiol: 68.9 pg/mL

## 2018-10-29 LAB — FOLLICLE STIMULATING HORMONE: FSH: 30.8 m[IU]/mL

## 2018-11-02 NOTE — Patient Instructions (Addendum)
Your procedure is scheduled on:  Monday, 11/14/2018  Enter through the Main Entrance of [facility] at: 8:45 am  Pick up the phone at the desk and dial [phone].  Call this number if you have problems the morning of surgery: [phone].  Remember: Do NOT eat food or Do NOT drink clear liquids (including water) after midnight Sunday.  Take these medicines the morning of surgery with a SIP OF WATER:  omeprazole  Brush your teeth on the day of surgery.  Do Not smoke on the day of surgery.  Stop herbal medications, vitamin supplements, Ibuprofen/NSAIDS 1 week prior to surgery - Monday, 11/07/2018.  May use Tylenol.  Do NOT wear jewelry (body piercing), metal hair clips/bobby pins, make-up, or nail polish. Do NOT wear lotions, powders, or perfumes.  You may wear deoderant. Do NOT shave for 48 hours prior to surgery. Do NOT bring valuables to the hospital.   Leave suitcase in car.  After surgery it may be brought to your room.  For patients admitted to the hospital, checkout time is 11:00 AM the day of discharge. Have a responsible adult drive you home and stay with you for 24 hours after your procedure.  Home with Husband TIm cell [phone].

## 2018-11-04 ENCOUNTER — Encounter (HOSPITAL_COMMUNITY)
Admission: RE | Admit: 2018-11-04 | Discharge: 2018-11-04 | Disposition: A | Discharge status: Home / Self Care | Payer: 59 | Source: Ambulatory Visit | Attending: Obstetrics and Gynecology | Admitting: Obstetrics and Gynecology

## 2018-11-04 ENCOUNTER — Other Ambulatory Visit: Payer: Self-pay

## 2018-11-04 ENCOUNTER — Encounter (HOSPITAL_COMMUNITY): Payer: Self-pay | Admitting: *Deleted

## 2018-11-04 DIAGNOSIS — Z01812 Encounter for preprocedural laboratory examination: Secondary | ICD-10-CM | POA: Insufficient documentation

## 2018-11-04 HISTORY — DX: Hyperlipidemia, unspecified: E78.5

## 2018-11-04 HISTORY — DX: Dermatitis, unspecified: L30.9

## 2018-11-04 HISTORY — DX: Herpesviral infection, unspecified: B00.9

## 2018-11-04 HISTORY — DX: Nicotine dependence, unspecified, uncomplicated: F17.200

## 2018-11-04 LAB — BASIC METABOLIC PANEL
Anion gap: 9 (ref 5–15)
BUN: 11 mg/dL (ref 6–20)
CO2: 26 mmol/L (ref 22–32)
Calcium: 9.2 mg/dL (ref 8.9–10.3)
Chloride: 101 mmol/L (ref 98–111)
Creatinine, Ser: 0.85 mg/dL (ref 0.44–1.00)
GFR calc Af Amer: >60 mL/min (ref >60)
GFR calc non Af Amer: >60 mL/min (ref >60)
Glucose, Bld: 96 mg/dL (ref 70–99)
Potassium: 4.1 mmol/L (ref 3.5–5.1)
Sodium: 136 mmol/L (ref 135–145)

## 2018-11-04 LAB — CBC
HCT: 42.5 % (ref 36.0–46.0)
Hemoglobin: 13.6 g/dL (ref 12.0–15.0)
MCH: 27.8 pg (ref 26.0–34.0)
MCHC: 32 g/dL (ref 30.0–36.0)
MCV: 86.7 fL (ref 80.0–100.0)
Platelets: 269 10*3/uL (ref 150–400)
RBC: 4.9 MIL/uL (ref 3.87–5.11)
RDW: 21.4 % — ABNORMAL HIGH (ref 11.5–15.5)
WBC: 7.1 10*3/uL (ref 4.0–10.5)
nRBC: 0 % (ref 0.0–0.2)

## 2018-11-04 LAB — TYPE AND SCREEN
ABO/RH(D): O POS
Antibody Screen: NEGATIVE

## 2018-11-04 LAB — ABO/RH: ABO/RH(D): O POS

## 2018-11-14 ENCOUNTER — Ambulatory Visit (HOSPITAL_COMMUNITY): Payer: 59

## 2018-11-14 ENCOUNTER — Encounter (HOSPITAL_COMMUNITY)
Admission: RE | Disposition: A | Discharge status: Home / Self Care | Payer: Self-pay | Source: Home / Self Care | Attending: Obstetrics and Gynecology

## 2018-11-14 ENCOUNTER — Encounter (HOSPITAL_COMMUNITY): Payer: Self-pay | Admitting: *Deleted

## 2018-11-14 ENCOUNTER — Other Ambulatory Visit: Payer: Self-pay

## 2018-11-14 ENCOUNTER — Observation Stay (HOSPITAL_COMMUNITY)
Admission: RE | Admit: 2018-11-14 | Discharge: 2018-11-15 | Disposition: A | Discharge status: Home / Self Care | Payer: 59 | Attending: Obstetrics and Gynecology | Admitting: Obstetrics and Gynecology

## 2018-11-14 DIAGNOSIS — Z8249 Family history of ischemic heart disease and other diseases of the circulatory system: Secondary | ICD-10-CM | POA: Insufficient documentation

## 2018-11-14 DIAGNOSIS — F17210 Nicotine dependence, cigarettes, uncomplicated: Secondary | ICD-10-CM | POA: Diagnosis not present

## 2018-11-14 DIAGNOSIS — Z90710 Acquired absence of both cervix and uterus: Secondary | ICD-10-CM

## 2018-11-14 DIAGNOSIS — Z0289 Encounter for other administrative examinations: Secondary | ICD-10-CM

## 2018-11-14 DIAGNOSIS — I10 Essential (primary) hypertension: Secondary | ICD-10-CM | POA: Insufficient documentation

## 2018-11-14 DIAGNOSIS — D259 Leiomyoma of uterus, unspecified: Secondary | ICD-10-CM | POA: Diagnosis present

## 2018-11-14 DIAGNOSIS — N736 Female pelvic peritoneal adhesions (postinfective): Secondary | ICD-10-CM | POA: Insufficient documentation

## 2018-11-14 DIAGNOSIS — K219 Gastro-esophageal reflux disease without esophagitis: Secondary | ICD-10-CM | POA: Insufficient documentation

## 2018-11-14 DIAGNOSIS — D500 Iron deficiency anemia secondary to blood loss (chronic): Secondary | ICD-10-CM

## 2018-11-14 DIAGNOSIS — D251 Intramural leiomyoma of uterus: Principal | ICD-10-CM | POA: Insufficient documentation

## 2018-11-14 DIAGNOSIS — Z79899 Other long term (current) drug therapy: Secondary | ICD-10-CM | POA: Diagnosis not present

## 2018-11-14 DIAGNOSIS — R87613 High grade squamous intraepithelial lesion on cytologic smear of cervix (HGSIL): Secondary | ICD-10-CM

## 2018-11-14 DIAGNOSIS — N871 Moderate cervical dysplasia: Secondary | ICD-10-CM | POA: Diagnosis present

## 2018-11-14 HISTORY — PX: TOTAL LAPAROSCOPIC HYSTERECTOMY WITH SALPINGECTOMY: SHX6742

## 2018-11-14 HISTORY — PX: CYSTOSCOPY: SHX5120

## 2018-11-14 LAB — PREGNANCY, URINE: Preg Test, Ur: NEGATIVE

## 2018-11-14 SURGERY — HYSTERECTOMY, TOTAL, LAPAROSCOPIC, WITH SALPINGECTOMY
Anesthesia: General

## 2018-11-14 MED ORDER — MENTHOL 3 MG MT LOZG: 1.0000 | LOZENGE | OROMUCOSAL | Status: DC | PRN

## 2018-11-14 MED ORDER — ROCURONIUM BROMIDE 100 MG/10ML IV SOLN
INTRAVENOUS | Status: AC
  Filled 2018-11-14: qty 1

## 2018-11-14 MED ORDER — ONDANSETRON HCL 4 MG/2ML IJ SOLN
INTRAMUSCULAR | Status: AC
  Filled 2018-11-14: qty 2

## 2018-11-14 MED ORDER — KETOROLAC TROMETHAMINE 30 MG/ML IJ SOLN
30.0000 mg | Freq: Four times a day (QID) | INTRAMUSCULAR | Status: DC
  Administered 2018-11-14 – 2018-11-15 (×3): 30 mg via INTRAVENOUS
  Filled 2018-11-14 (×3): qty 1

## 2018-11-14 MED ORDER — SODIUM CHLORIDE (PF) 0.9 % IJ SOLN
INTRAMUSCULAR | Status: AC
  Filled 2018-11-14: qty 50

## 2018-11-14 MED ORDER — FENTANYL CITRATE (PF) 100 MCG/2ML IJ SOLN
INTRAMUSCULAR | Status: AC
  Administered 2018-11-14: 50 ug via INTRAVENOUS
  Filled 2018-11-14: qty 2

## 2018-11-14 MED ORDER — ONDANSETRON HCL 4 MG/2ML IJ SOLN: 4.0000 mg | Freq: Once | INTRAMUSCULAR | Status: DC | PRN

## 2018-11-14 MED ORDER — ACETAMINOPHEN 10 MG/ML IV SOLN: 1000.0000 mg | Freq: Once | INTRAVENOUS | Status: DC

## 2018-11-14 MED ORDER — SCOPOLAMINE 1 MG/3DAYS TD PT72
MEDICATED_PATCH | TRANSDERMAL | Status: AC
  Filled 2018-11-14: qty 1

## 2018-11-14 MED ORDER — KETOROLAC TROMETHAMINE 30 MG/ML IJ SOLN
INTRAMUSCULAR | Status: DC | PRN
  Administered 2018-11-14: 30 mg via INTRAVENOUS

## 2018-11-14 MED ORDER — SODIUM CHLORIDE 0.9 % IV SOLN
2.0000 g | INTRAVENOUS | Status: AC
  Administered 2018-11-14: 2 g via INTRAVENOUS

## 2018-11-14 MED ORDER — LIDOCAINE HCL (CARDIAC) PF 100 MG/5ML IV SOSY
PREFILLED_SYRINGE | INTRAVENOUS | Status: AC
  Filled 2018-11-14: qty 5

## 2018-11-14 MED ORDER — ROPIVACAINE HCL 5 MG/ML IJ SOLN
INTRAMUSCULAR | Status: AC
  Filled 2018-11-14: qty 30

## 2018-11-14 MED ORDER — MIDAZOLAM HCL 2 MG/2ML IJ SOLN
INTRAMUSCULAR | Status: AC
  Filled 2018-11-14: qty 2

## 2018-11-14 MED ORDER — SODIUM CHLORIDE 0.9 % IV SOLN
INTRAVENOUS | Status: DC | PRN
  Administered 2018-11-14: 60 mL

## 2018-11-14 MED ORDER — FENTANYL CITRATE (PF) 250 MCG/5ML IJ SOLN
INTRAMUSCULAR | Status: AC
  Filled 2018-11-14: qty 5

## 2018-11-14 MED ORDER — OXYCODONE-ACETAMINOPHEN 5-325 MG PO TABS
1.0000 | ORAL_TABLET | ORAL | Status: DC | PRN
  Administered 2018-11-14 – 2018-11-15 (×2): 2 via ORAL
  Filled 2018-11-14 (×2): qty 2

## 2018-11-14 MED ORDER — PROPOFOL 10 MG/ML IV BOLUS
INTRAVENOUS | Status: DC | PRN
  Administered 2018-11-14: 200 mg via INTRAVENOUS

## 2018-11-14 MED ORDER — MORPHINE SULFATE (PF) 4 MG/ML IV SOLN
1.0000 mg | INTRAVENOUS | Status: DC | PRN
  Administered 2018-11-14: 2 mg via INTRAVENOUS
  Filled 2018-11-14 (×2): qty 1

## 2018-11-14 MED ORDER — DEXAMETHASONE SODIUM PHOSPHATE 10 MG/ML IJ SOLN
INTRAMUSCULAR | Status: DC | PRN
  Administered 2018-11-14: 10 mg via INTRAVENOUS

## 2018-11-14 MED ORDER — FENTANYL CITRATE (PF) 100 MCG/2ML IJ SOLN
INTRAMUSCULAR | Status: AC
  Filled 2018-11-14: qty 2

## 2018-11-14 MED ORDER — DEXAMETHASONE SODIUM PHOSPHATE 10 MG/ML IJ SOLN
INTRAMUSCULAR | Status: AC
  Filled 2018-11-14: qty 1

## 2018-11-14 MED ORDER — STERILE WATER FOR IRRIGATION IR SOLN
Status: DC | PRN
  Administered 2018-11-14: 1000 mL

## 2018-11-14 MED ORDER — BUPIVACAINE HCL (PF) 0.25 % IJ SOLN
INTRAMUSCULAR | Status: AC
  Filled 2018-11-14: qty 30

## 2018-11-14 MED ORDER — LACTATED RINGERS IV SOLN
INTRAVENOUS | Status: DC
  Administered 2018-11-14 (×3): via INTRAVENOUS

## 2018-11-14 MED ORDER — SUGAMMADEX SODIUM 200 MG/2ML IV SOLN
INTRAVENOUS | Status: DC | PRN
  Administered 2018-11-14: 200 mg via INTRAVENOUS

## 2018-11-14 MED ORDER — BUPIVACAINE HCL (PF) 0.25 % IJ SOLN
INTRAMUSCULAR | Status: DC | PRN
  Administered 2018-11-14: 8 mL
  Administered 2018-11-14: 5 mL

## 2018-11-14 MED ORDER — FENTANYL CITRATE (PF) 100 MCG/2ML IJ SOLN
INTRAMUSCULAR | Status: DC | PRN
  Administered 2018-11-14: 25 ug via INTRAVENOUS
  Administered 2018-11-14 (×3): 50 ug via INTRAVENOUS
  Administered 2018-11-14: 25 ug via INTRAVENOUS
  Administered 2018-11-14: 50 ug via INTRAVENOUS

## 2018-11-14 MED ORDER — SCOPOLAMINE 1 MG/3DAYS TD PT72
1.0000 | MEDICATED_PATCH | Freq: Once | TRANSDERMAL | Status: DC
  Administered 2018-11-14: 1.5 mg via TRANSDERMAL

## 2018-11-14 MED ORDER — OXYCODONE HCL 5 MG/5ML PO SOLN: 5.0000 mg | Freq: Once | ORAL | Status: DC | PRN

## 2018-11-14 MED ORDER — ONDANSETRON HCL 4 MG/2ML IJ SOLN
INTRAMUSCULAR | Status: DC | PRN
  Administered 2018-11-14: 4 mg via INTRAVENOUS

## 2018-11-14 MED ORDER — ONDANSETRON HCL 4 MG/2ML IJ SOLN: 4.0000 mg | Freq: Four times a day (QID) | INTRAMUSCULAR | Status: DC | PRN

## 2018-11-14 MED ORDER — ONDANSETRON HCL 4 MG PO TABS: 4.0000 mg | ORAL_TABLET | Freq: Four times a day (QID) | ORAL | Status: DC | PRN

## 2018-11-14 MED ORDER — EPHEDRINE SULFATE 50 MG/ML IJ SOLN
INTRAMUSCULAR | Status: DC | PRN
  Administered 2018-11-14: 10 mg via INTRAVENOUS

## 2018-11-14 MED ORDER — ROCURONIUM BROMIDE 100 MG/10ML IV SOLN
INTRAVENOUS | Status: DC | PRN
  Administered 2018-11-14 (×2): 10 mg via INTRAVENOUS
  Administered 2018-11-14: 60 mg via INTRAVENOUS

## 2018-11-14 MED ORDER — PROPOFOL 10 MG/ML IV BOLUS
INTRAVENOUS | Status: AC
  Filled 2018-11-14: qty 20

## 2018-11-14 MED ORDER — ENOXAPARIN SODIUM 40 MG/0.4ML ~~LOC~~ SOLN
SUBCUTANEOUS | Status: AC
  Filled 2018-11-14: qty 0.4

## 2018-11-14 MED ORDER — LIDOCAINE HCL (CARDIAC) PF 100 MG/5ML IV SOSY
PREFILLED_SYRINGE | INTRAVENOUS | Status: DC | PRN
  Administered 2018-11-14: 60 mg via INTRAVENOUS

## 2018-11-14 MED ORDER — GLYCOPYRROLATE 0.2 MG/ML IJ SOLN
INTRAMUSCULAR | Status: AC
  Filled 2018-11-14: qty 1

## 2018-11-14 MED ORDER — LACTATED RINGERS IV SOLN
INTRAVENOUS | Status: DC
  Administered 2018-11-14 (×2): via INTRAVENOUS

## 2018-11-14 MED ORDER — FENTANYL CITRATE (PF) 100 MCG/2ML IJ SOLN
25.0000 ug | INTRAMUSCULAR | Status: DC | PRN
  Administered 2018-11-14 (×3): 50 ug via INTRAVENOUS

## 2018-11-14 MED ORDER — SODIUM CHLORIDE 0.9 % IV SOLN
INTRAVENOUS | Status: AC
  Filled 2018-11-14: qty 2

## 2018-11-14 MED ORDER — ENOXAPARIN SODIUM 40 MG/0.4ML ~~LOC~~ SOLN
40.0000 mg | SUBCUTANEOUS | Status: AC
  Administered 2018-11-14: 40 mg via SUBCUTANEOUS

## 2018-11-14 MED ORDER — PANTOPRAZOLE SODIUM 40 MG PO TBEC
80.0000 mg | DELAYED_RELEASE_TABLET | Freq: Every day | ORAL | Status: DC
  Filled 2018-11-14 (×2): qty 2

## 2018-11-14 MED ORDER — KETOROLAC TROMETHAMINE 30 MG/ML IJ SOLN
INTRAMUSCULAR | Status: AC
  Filled 2018-11-14: qty 1

## 2018-11-14 MED ORDER — ACETAMINOPHEN 10 MG/ML IV SOLN
INTRAVENOUS | Status: AC
  Administered 2018-11-14: 1000 mg
  Filled 2018-11-14: qty 100

## 2018-11-14 MED ORDER — MIDAZOLAM HCL 2 MG/2ML IJ SOLN
INTRAMUSCULAR | Status: DC | PRN
  Administered 2018-11-14: 2 mg via INTRAVENOUS

## 2018-11-14 MED ORDER — OXYCODONE HCL 5 MG PO TABS: 5.0000 mg | ORAL_TABLET | Freq: Once | ORAL | Status: DC | PRN

## 2018-11-14 MED ORDER — GLYCOPYRROLATE 0.2 MG/ML IJ SOLN
INTRAMUSCULAR | Status: DC | PRN
  Administered 2018-11-14: 0.2 mg via INTRAVENOUS

## 2018-11-14 SURGICAL SUPPLY — 58 items
APPLICATOR ARISTA FLEXITIP XL (MISCELLANEOUS) IMPLANT
BARRIER ADHS 3X4 INTERCEED (GAUZE/BANDAGES/DRESSINGS) IMPLANT
BLADE SURG 10 STRL SS (BLADE) IMPLANT
CABLE HIGH FREQUENCY MONO STRZ (ELECTRODE) IMPLANT
CANISTER SUCT 3000ML PPV (MISCELLANEOUS) ×3 IMPLANT
CELL SAVER LIPIGURD (MISCELLANEOUS) ×2 IMPLANT
COVER BACK TABLE 60X90IN (DRAPES) IMPLANT
COVER MAYO STAND STRL (DRAPES) ×3 IMPLANT
DECANTER SPIKE VIAL GLASS SM (MISCELLANEOUS) ×6 IMPLANT
DERMABOND ADHESIVE PROPEN (GAUZE/BANDAGES/DRESSINGS) ×1
DERMABOND ADVANCED (GAUZE/BANDAGES/DRESSINGS) ×1
DERMABOND ADVANCED .7 DNX12 (GAUZE/BANDAGES/DRESSINGS) ×2 IMPLANT
DERMABOND ADVANCED .7 DNX6 (GAUZE/BANDAGES/DRESSINGS) IMPLANT
DEVICE RETRIEVAL ALEXIS 14 (MISCELLANEOUS) IMPLANT
DURAPREP 26ML APPLICATOR (WOUND CARE) ×3 IMPLANT
EXTRT SYSTEM ALEXIS 14CM (MISCELLANEOUS) ×3
EXTRT SYSTEM ALEXIS 17CM (MISCELLANEOUS)
GLOVE BIO SURGEON STRL SZ 6.5 (GLOVE) ×6 IMPLANT
GLOVE BIOGEL PI IND STRL 7.0 (GLOVE) ×6 IMPLANT
GLOVE BIOGEL PI INDICATOR 7.0 (GLOVE) ×3
GOWN STRL REUS W/TWL LRG LVL3 (GOWN DISPOSABLE) ×12 IMPLANT
HEMOSTAT ARISTA ABSORB 3G PWDR (HEMOSTASIS) IMPLANT
HIBICLENS CHG 4% 4OZ BTL (MISCELLANEOUS) ×3 IMPLANT
LIGASURE VESSEL 5MM BLUNT TIP (ELECTROSURGICAL) ×3 IMPLANT
NEEDLE INSUFFLATION 120MM (ENDOMECHANICALS) ×3 IMPLANT
OCCLUDER COLPOPNEUMO (BALLOONS) ×3 IMPLANT
PACK LAPAROSCOPY BASIN (CUSTOM PROCEDURE TRAY) ×3 IMPLANT
PACK TRENDGUARD 450 HYBRID PRO (MISCELLANEOUS) IMPLANT
POUCH LAPAROSCOPIC INSTRUMENT (MISCELLANEOUS) ×3 IMPLANT
PROTECTOR NERVE ULNAR (MISCELLANEOUS) ×6 IMPLANT
SCISSORS LAP 5X35 DISP (ENDOMECHANICALS) IMPLANT
SET CYSTO W/LG BORE CLAMP LF (SET/KITS/TRAYS/PACK) ×3 IMPLANT
SET IRRIG TUBING LAPAROSCOPIC (IRRIGATION / IRRIGATOR) ×3 IMPLANT
SET TRI-LUMEN FLTR TB AIRSEAL (TUBING) ×3 IMPLANT
SLEEVE ADV FIXATION 5X100MM (TROCAR) ×3 IMPLANT
SUT VIC AB 0 CT1 27 (SUTURE) ×2
SUT VIC AB 0 CT1 27XBRD ANBCTR (SUTURE) ×4 IMPLANT
SUT VICRYL 0 UR6 27IN ABS (SUTURE) ×1 IMPLANT
SUT VICRYL 4-0 PS2 18IN ABS (SUTURE) ×3 IMPLANT
SUT VLOC 180 0 9IN  GS21 (SUTURE) ×1
SUT VLOC 180 0 9IN GS21 (SUTURE) ×2 IMPLANT
SYR 10ML LL (SYRINGE) ×3 IMPLANT
SYR 50ML LL SCALE MARK (SYRINGE) ×6 IMPLANT
SYSTEM CARTER THOMASON II (TROCAR) ×1 IMPLANT
SYSTEM CONTND EXTRCTN KII BLLN (MISCELLANEOUS) IMPLANT
TIP RUMI ORANGE 6.7MMX12CM (TIP) IMPLANT
TIP UTERINE 5.1X6CM LAV DISP (MISCELLANEOUS) IMPLANT
TIP UTERINE 6.7X10CM GRN DISP (MISCELLANEOUS) ×1 IMPLANT
TIP UTERINE 6.7X6CM WHT DISP (MISCELLANEOUS) IMPLANT
TIP UTERINE 6.7X8CM BLUE DISP (MISCELLANEOUS) ×1 IMPLANT
TOWEL OR 17X24 6PK STRL BLUE (TOWEL DISPOSABLE) ×6 IMPLANT
TRAY FOLEY W/BAG SLVR 14FR (SET/KITS/TRAYS/PACK) ×3 IMPLANT
TRENDGUARD 450 HYBRID PRO PACK (MISCELLANEOUS) ×3
TROCAR ADV FIXATION 5X100MM (TROCAR) ×3 IMPLANT
TROCAR PORT AIRSEAL 8X120 (TROCAR) ×3 IMPLANT
TROCAR XCEL NON-BLD 5MMX100MML (ENDOMECHANICALS) ×3 IMPLANT
TUBING INSUF HEATED (TUBING) ×3 IMPLANT
WARMER LAPAROSCOPE (MISCELLANEOUS) ×3 IMPLANT

## 2018-11-14 NOTE — Anesthesia Procedure Notes (Signed)
Procedure Name: Intubation Date/Time: 11/14/2018 10:34 AM Performed by: Lidia Collum, MD Pre-anesthesia Checklist: Patient identified, Emergency Drugs available, Suction available and Patient being monitored Patient Re-evaluated:Patient Re-evaluated prior to induction Oxygen Delivery Method: Circle system utilized Preoxygenation: Pre-oxygenation with 100% oxygen Induction Type: IV induction Ventilation: Mask ventilation without difficulty Laryngoscope Size: Mac and 3 Grade View: Grade II Tube type: Oral Tube size: 7.0 mm Number of attempts: 1 Airway Equipment and Method: Stylet Placement Confirmation: ETT inserted through vocal cords under direct vision,  positive ETCO2,  CO2 detector and breath sounds checked- equal and bilateral Secured at: 21 cm Tube secured with: Tape Dental Injury: Teeth and Oropharynx as per pre-operative assessment  Comments: Preformed by S. Virginia Rochester.

## 2018-11-14 NOTE — Op Note (Signed)
OPERATIVE REPORT  PREOPERATIVE DIAGNOSIS:  Symptomatic fibroids, CIN II, status post cold knife conization with positive margin and positive ECC.  POSTOPERATIVE DIAGNOSIS:  Symptomatic fibroids, CIN II, status post cold knife conization with positive margin and positive ECC, pelvic adhesions.   PROCEDURES:  Total laparoscopic hysterectomy with bilateral salpingectomy, cystoscopy, lysis of adhesions.  SURGEON:  Lenard Galloway, M.D.  ASSISTANT:   Dorothy Spark, M.D.  ANESTHESIA:  General endotracheal, intraperitoneal ropivicaine 30 mL diluted in 30 mL of normal saline, local with 0.25% Marcaine.  IVF:   1800 cc LR  ESTIMATED BLOOD LOSS:   75 cc  URINE OUTPUT:   376 cc  COMPLICATIONS:  None.  INDICATIONS FOR THE PROCEDURE:     The patient is a 48 year old G14P2 Caucasian female with fibroids, heavy bleeding, and a history of anemia who received a diagnosis of high grade cervical dysplasia during her work up.  Her menstruation has since stopped, and her [place] and estradiol indicate she is perimenopausal.  Her anemia resolved after iron infusions.  She is a status post cold knife conization showing CIN II with a positive margin and a positive ECC showing CIN II.  Endometrial curettage was benign.  She declines further medical therapy and future childbearing, and is requesting hysterectomy procedure.   A plan is made to proceed with a total laparoscopic hysterectomy with bilateral salpingectomy, possible bilateral oophorectomy, and cystoscopy after risks, benefits, and alternatives are reviewed.  FINDINGS:     Upon umbilical exam, the patient appeared to have a minor hernia in her prior laparoscopy incisions.   Laparoscopy revealed a normal uterus, bilateral tubes and ovaries.  The upper abdomen was normal and demonstrated a normal liver.  There was minor adhesive adhesive disease of the bowel to the left cul de sac, and this was completely excised.  No endometriosis was seen in the abdomen  or pelvis.  The appendix tip was normal.   Cystoscopy at the termination of the procedure showed the bladder to be normal throughout 360 degrees including the bladder dome and trigone. There was no evidence of any foreign body in the bladder or the urethra. There was no evidence of any lesions  of the bladder or the urethra.   Both of the ureters were noted to be patent bilaterally.  SPECIMENS:     The uterus, cervix, and bilateral tubes were went to pathology.   DESCRIPTION OF PROCEDURE:    The patient was reidentified in the preoperative hold area.   She did receive Cefotetan IV for antibiotic prophylaxis.  She received Lovenox, TED hose and PAS stockings for DVT prophylaxis.  In the operating room, the patient was placed in the dorsal lithotomy position on the operating room table.  The Trendguard was used to support her on the OR table.  Her legs were placed in the [place] stirrups and her arms were both padded and tucked at her sides. The patient received general endotracheal anesthesia. The abdomen and vagina were then sterilely prepped and she was sterilely draped.  A speculum was placed in the vagina and a single-tooth tenaculum was placed on the anterior cervical lip.  A figure-of-eight suture of 0 Vicryl was placed on each the anterior and the posterior cervical lips. The uterus was sounded to _ 10___ cm.  The cervix was then dilated with Hegar dilators.  A #  __10____ RUMI tip with a small metal KOH ring was then placed through the cervix and into the uterine cavity without  difficulty.  The remaining vaginal instruments were then removed.  A Foley catheter was placed inside the bladder.  Attention was turned to the abdomen where the left upper quadrant skin was injected with local 0.25% Marcaine and incised with a scalpel.  The Optiview was used to place a 5 mm trocar in the peritoneal cavity.  A CO2 pneumoperitoneum was created.  The umbilical region was then injected with 0.25% Marcaine  and a small incision created.    A 5 mm umbilical incision was created with a scalpel after the skin. A 5 mm camera port was then placed using the Optiview.  5 mm incisions were then created in the left upper abdomen and the left lower abdomen after the skin was injected locally with 0.25% Marcaine.  The 5 mm trocars were then placed under visualization of the laparoscope.  An 8 mm trocar was placed in the right lower quadrant after injecting with Marcaine and incising with a scalpel.     Ropivicine was placed inside the peritoneal cavity.   The patient was placed in Trendelenburg position.  An inspection of the abdomen and pelvis was performed. The findings are as noted above.   The bilateral ureters were identified.  The left fallopian tube was grasped and the Ligasure was used to cauterize and cut through the mesosalpinx and then the proximal tube.  The specimen was sent to pathology.  The left utero-ovarian ligament was similarly cauterized and cut with the Ligasure.  The left round ligament was then cauterized and divided with same instrument.  Dissection was performed to the anterior and posterior leaves of the broad ligaments using the Harmonic scalpel.  The incision was carried across the anterior cul- de-sac along the vesicouterine fold and the bladder was dissected away from the cervix using the Harmonic scalpel. The peritoneum was taken down posteriorly.  The left uterine artery was skeletonized.   It was then cauterized and cut with the Ligasure instrument.  Attention was turned to the patient's right-hand side at this time.  The same procedure that was performed on the left side was repeated on the right side with respect to the isolation, cautery, and transection of the vessels and the bladder flap dissection.   The right fallopian tube was removed from the peritoneal cavity as well.  The KOH ring was nicely visible.  The colpotomy incision was performed with the Harmonic scalpel in a  circumferential fashion.   An attempt was made to remove the specimen intact vaginally through the colpotomy incision.  This was not possible.  An Alexis bag was therefore placed intraperitoneally through the vaginal incision, and the specimen was placed inside this.  The bag with specimen was then brought into the vagina.  Morcellation was performed inside the bag.  The cervix was amputated as high as possible from the fundus.  The fundus was then morcellated into a few large pieces in order to allow for its removal.  The specimen was sent to Pathology.    The vaginal cuff was sutured laparoscopically using a running suture of 0 V-Loc.  The vagina was closed from the patient's right hand side to the left hand side and then back 2 sutures towards the midline.  This provided good full-thickness closure of the vaginal cuff.  The laparoscopic needle for suturing was removed from the peritoneal cavity.  The pelvis was irrigated and suctioned.   The pneumoperitoneal was let down.  There was good hemostasis of the operative sites and pedicles.  The adhesions in the cul de sac were lysed with the Harmonic scalpel.  Carleene Overlie was placed in the pelvis over the operating sites.   The 8 mm trocar was removed and the [employer] instrument was used to close the fascia with a 0/0 vicryl suture.   The remaining left sided laparoscopic trocars were removed after the CO2 pneumoperitoneum was released and the patient received manual breaths.    The patient's Foley catheter was removed, and cystoscopy was performed and the findings are as noted above.  The Foley catheter was left out.   Final inspection of the vagina demonstrated minor bleeding in the midline of the 0/0 V-lock suture line.  A single figure of eight suture was placed in the midline, and hemostasis was then good.    All skin incisions were closed with subcuticular sutures of 4-0 Vicryl.  Dermabond was placed over the incisions.  This concluded  the patient's procedure.  She was extubated and escorted to the recovery room in stable and awake condition.  There were no complications to the procedure.  All needle, instrument, and sponge counts were correct.

## 2018-11-14 NOTE — H&P (Signed)
Office Visit   10/28/2018 Andrea [name], Andrea All, MD  Obstetrics and Gynecology   CIN II (cervical intraepithelial neoplasia II) +3 more  Dx   Surgical Consult   ; Referred by Andrea Panda, MD  Reason for Visit   Additional Documentation   Vitals:   BP 120/68 (BP Location: Right Arm, Patient Position: Sitting, Cuff Size: Large)   Pulse 80   Resp 16   Wt 104.6 kg   LMP 07/05/2018 (Exact Date)   BMI 36.12 kg/m   BSA 2.22 m   Flowsheets:   MEWS Score,   Anthropometrics     Encounter Info:   Billing Info,   History,   Allergies,   Detailed Report     [name] Notes   Progress Notes by Nunzio Cobbs, MD at 10/28/2018 10:45 AM  Author: Nunzio Cobbs, MD Author Type: Physician Filed: 10/30/2018 7:06 AM  Note Status: Signed Cosign: Cosign Not Required Encounter Date: 10/28/2018  Editor: Nunzio Cobbs, MD (Physician)  Prior Versions: 1. Lowella Fairy, CMA (Certified Medical Assistant) at 10/28/2018 11:04 AM - Sign when Signing Visit   2. Archie Balboa CMA (Certified Psychologist, sport and exercise) at 10/28/2018 10:43 AM - Sign when Signing Visit    GYNECOLOGY  VISIT   HPI: 48 y.o.   Married  Caucasian  female   [id number] with Patient's last menstrual period was 07/05/2018 (exact date).   here for surgery consult for Hysterectomy  She has fibroids, heavy bleeding, anemia, and cervical dysplasia.  Her pelvic US showed several small fibroids, largest 2.66 cm.  Her cervical dysplasia was detected with her preop endometrial biopsy showing squamous dysplasia of the cervix.  She had a follow up colposcopy and then cervical conization 09/20/18 showing CIN II with a positive margin and an ECC positive for CIN II.  Her concurrent intraop endometrial curettage showed benign endometrium.  Hgb 10.2 on 09/19/18.  Has done 2 iron infusions and is feeling much better No menses since 10/28/17, and asking about her hormonal status.  Did  have intercourse once since her conization.   She declines future childbearing, and has had a tubal ligation. Having hot flashes.  She has a history of endometriosis.   Occasional leak of urine with cough or jumping on a trampoline.  She does not consider this a problem or want surgery for this.   GYNECOLOGIC HISTORY: Patient's last menstrual period was 07/05/2018 (exact date). Contraception:  [place] Menopausal hormone therapy:  none Last mammogram:  03/15/18 BIRADS 1 negative/density b Last pap smear: 08/29/18 dysplastic squamous cells of cervix; 09/05/18 Neg:Neg HR HPV; 09/05/18 ECC Negative; 09/20/18 CIN II w/ positive margin of the conization -- endometrial curettage negative and normal                OB History    Gravida  3   Para  2   Term      Preterm      AB  1   Living  2     SAB  1   TAB      Ectopic      Multiple      Live Births                     Patient Active Problem List   Diagnosis Date Noted  . Iron deficiency anemia 09/12/2018  . Menorrhagia with irregular cycle 08/25/2018  .  Pelvic pain 08/25/2018  . Dyspareunia in female 08/25/2018  . Vertigo 09/15/2017        Past Medical History:  Diagnosis Date  . Abnormal Pap smear of cervix    2010-2014 abnormal pap every 3-6 months   dysplastic squamous cells of cervix   . Anxiety    occ.  Marland Kitchen Aortic insufficiency 08/2017   trivial aortic insufficiency, noted on ECHO  . Blood in stool   . Depression   . DUB (dysfunctional uterine bleeding)   . Dyspnea    due to anemia  . Endometriosis   . Epigastric pain   . Fatigue   . Fibroid   . GERD (gastroesophageal reflux disease)   . Grade I diastolic dysfunction 31/54/0086   Noted on ECHO  . Hemorrhoid    2nd and 3rd degree  . History of adenomatous polyp of colon   . History of palpitations   . Hypertension    not currently taking medication  . Infertility, female   . Iron deficiency anemia     Last iron infusion 09/14/2018  . Left groin hernia    history of  . Muscle cramping    with exertion  . Recurrent bacterial infection    Vaginal  . Restless leg    due to anemia  . Thoracic back pain    while trying to sleep  . Tinnitus, left    hears heart beat, MRI normal  . Vertigo          Past Surgical History:  Procedure Laterality Date  . ABLATION ON ENDOMETRIOSIS  1995  . CERVICAL BIOPSY  W/ LOOP ELECTRODE EXCISION    . CERVICAL CONIZATION W/BX N/A 09/20/2018   Procedure: CONIZATION CERVIX WITH BIOPSY;  Surgeon: Nunzio Cobbs, MD;  Location: [facility];  Service: Gynecology;  Laterality: N/A;  . COLONOSCOPY  06/2018  . COLPOSCOPY     x 3  . DILATION AND CURETTAGE OF UTERUS  1993  . DILATION AND CURETTAGE OF UTERUS N/A 09/20/2018   Procedure: FRACTIONAL DILATATION AND CURETTAGE;  Surgeon: Nunzio Cobbs, MD;  Location: [facility];  Service: Gynecology;  Laterality: N/A;  . GYNECOLOGIC CRYOSURGERY    . HEMORRHOID BANDING     several  . HERNIA REPAIR Left    childhood  . INTRAUTERINE DEVICE (IUD) INSERTION  2013    (Mirena)  . INTRAUTERINE DEVICE INSERTION  2000   [place]  . IUD REMOVAL  2010  . IUD REMOVAL  2018  . TUBAL LIGATION     2010  . UPPER GI ENDOSCOPY  06/2018  . WISDOM TOOTH EXTRACTION            Current Outpatient Medications  Medication Sig Dispense Refill  . clobetasol cream (TEMOVATE) 7.61 % Apply 1 application topically daily as needed (for irritation).    Marland Kitchen ibuprofen (ADVIL,MOTRIN) 200 MG tablet Take 4 tablets (800 mg total) by mouth every 8 (eight) hours as needed for headache or moderate pain. 30 tablet 0  . omeprazole (PRILOSEC) 40 MG capsule Take 40 mg by mouth every morning.      No current facility-administered medications for this visit.      ALLERGIES: Patient has no known allergies.       Family History  Problem Relation  Age of Onset  . Cancer Mother 70       Dec.Stage IV colon ca 10/2013--age 5  . Heart disease Father   . Heart  disease Paternal Grandfather   . Other Paternal Grandfather        Dec MVA  . Other Brother        Dec in a MVA age 51  . Alzheimer's disease Maternal Grandfather        Dec  . Other Maternal Grandfather   . Other Paternal Grandmother        Dec MVA  . Heart failure Paternal Aunt        Stage 4 colon cancer    Social History        Socioeconomic History  . Marital status: Married    Spouse name: Not on file  . Number of children: Not on file  . Years of education: Not on file  . Highest education level: Not on file  Occupational History  . Not on file  Social Needs  . Financial resource strain: Not on file  . Food insecurity:    Worry: Not on file    Inability: Not on file  . Transportation needs:    Medical: Not on file    Non-medical: Not on file  Tobacco Use  . Smoking status: Current Every Day Smoker    Packs/day: 0.50    Years: 35.00    Pack years: 17.50    Types: Cigarettes  . Smokeless tobacco: Never Used  Substance and Sexual Activity  . Alcohol use: Yes    Alcohol/week: 5.0 standard drinks    Types: 5 Glasses of wine per week    Comment: 6-7 drinks a week (alcohol)  Red Bull and Vodka or red wine  . Drug use: Not Currently  . Sexual activity: Yes    Partners: Male    Birth control/protection: Surgical    Comment: BTL  Lifestyle  . Physical activity:    Days per week: Not on file    Minutes per session: Not on file  . Stress: Not on file  Relationships  . Social connections:    Talks on phone: Not on file    Gets together: Not on file    Attends religious service: Not on file    Active member of club or organization: Not on file    Attends meetings of clubs or organizations: Not on file    Relationship status: Not on file  . Intimate partner violence:    Fear of current  or ex partner: Not on file    Emotionally abused: Not on file    Physically abused: Not on file    Forced sexual activity: Not on file  Other Topics Concern  . Not on file  Social History Narrative  . Not on file    Review of Systems  [name] other systems reviewed and are negative.   PHYSICAL EXAMINATION:    BP 120/68 (BP Location: Right Arm, Patient Position: Sitting, Cuff Size: Large)   Pulse 80   Resp 16   Wt 230 lb 9.6 oz (104.6 kg)   LMP 07/05/2018 (Exact Date)   BMI 36.12 kg/m     General appearance: alert, cooperative and appears stated age Head: Normocephalic, without obvious abnormality, atraumatic Neck: no adenopathy, supple, symmetrical, trachea midline and thyroid normal to inspection and palpation Lungs: clear to auscultation bilaterally Heart: regular rate and rhythm Abdomen: soft, non-tender, no masses,  no organomegaly.  Possible tiny umbilical hernia.  Extremities: extremities normal, atraumatic, no cyanosis or edema Skin: Skin color, texture, turgor normal. No rashes or lesions No abnormal inguinal nodes palpated Neurologic: Grossly normal  Pelvic: External genitalia:  no lesions              Urethra:  normal appearing urethra with no masses, tenderness or lesions              Bartholins and Skenes: normal                 Vagina: normal appearing vagina with normal color and discharge, no lesions              Cervix: no lesions                Bimanual Exam:  Uterus:  normal size, contour, position, consistency, mobility, non-tender              Adnexa: no mass, fullness, tenderness             Chaperone was present for exam.  ASSESSMENT  Menorrhagia and fibroids.  Now amenorrheic. Hx pelvic pain and dyspareunia. Anemia. Treated with iron infusions.  Cervical dysplasia.  Status post prior LEEP and recent cold knife conization with fractional dilation and curettage.  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 Hx endometriosis. FH  of cardiovascular disease.  Anxiety. Smoker.  PLAN  We had a detailed discussed regarding her cervical dysplasia and positive margins.  We reviewed her alternatives of pap and ECC in 4 months, repeat excision of the dysplasia, and proceeding with hysterectomy.   Will check CBC, FSH and estradiol levels.   I discussed total laparoscopic hysterectomy with bilateral salpingectomy and possible bilateral oophorectomy, cystoscopy.   Will keep her ovaries if normal.  I reviewed risks, benefits, and alternatives.  Risks include but are not limited to bleeding, infection, damage to surrounding organs, pneumonia, reaction to anesthesia, DVT, PE, death, need for reoperation, hernia formation, vaginal cuff dehiscence, and neuropathy.   Surgical expectations and recovery discussed.  Patient wishes to proceed with hysterectomy as outlined above.  She will do a magnesium citrate bowel prep.  She will receive Lovenox for DVT/PE prophylaxis.  Questions invited and answered.  She will do a flu vaccine if not already done.   She will try to decrease her use of tobacco.   An After Visit Summary was printed and given to the patient.  __25____ minutes face to face time of which over 50% was spent in counseling.

## 2018-11-14 NOTE — Anesthesia Preprocedure Evaluation (Addendum)
Anesthesia Evaluation  Patient identified by MRN, date of birth, ID band Patient awake    Reviewed: Allergy & Precautions, NPO status , Patient's Chart, lab work & pertinent test results  History of Anesthesia Complications Negative for: history of anesthetic complications  Airway Mallampati: II  TM Distance: >3 FB Neck ROM: Full    Dental no notable dental hx.    Pulmonary Current Smoker,    Pulmonary exam normal        Cardiovascular hypertension, Normal cardiovascular exam     Neuro/Psych negative neurological ROS  negative psych ROS   GI/Hepatic Neg liver ROS, GERD  ,  Endo/Other  negative endocrine ROS  Renal/GU negative Renal ROS  negative genitourinary   Musculoskeletal negative musculoskeletal ROS (+)   Abdominal (+) + obese,   Peds  Hematology negative hematology ROS (+)   Anesthesia Other Findings 48 yo F for lap hysterectomy - current smoker, HTN (on no meds), GERD, BMI 36 - Hgb 13.6, plts 269 - TTE 06/25/2017: EF 60-65%, grade 1 dd, no significant valve abnormality  Reproductive/Obstetrics                            Anesthesia Physical Anesthesia Plan  ASA: II  Anesthesia Plan: General   Post-op Pain Management:    Induction: Intravenous  PONV Risk Score and Plan: 3 and Ondansetron, Dexamethasone, Midazolam and Treatment may vary due to age or medical condition  Airway Management Planned: Oral ETT  Additional Equipment: None  Intra-op Plan:   Post-operative Plan: Extubation in OR  Informed Consent: I have reviewed the patients History and Physical, chart, labs and discussed the procedure including the risks, benefits and alternatives for the proposed anesthesia with the patient or authorized representative who has indicated his/her understanding and acceptance.       Plan Discussed with:   Anesthesia Plan Comments:        Anesthesia Quick  Evaluation

## 2018-11-14 NOTE — Progress Notes (Signed)
Day of Surgery Procedure(s) (LRB): TOTAL LAPAROSCOPIC HYSTERECTOMY WITH BILATERAL SALPINGECTOMY AND LYSIS OF ADHESIONS (Bilateral) CYSTOSCOPY (N/A)  Subjective: Patient reports tolerating PO and no problems voiding.   States she is hungry.  Ambulated to the bathroom.  Objective: I have reviewed patient's vital signs and intake and output. Vitals:   11/14/18 1515 11/14/18 1622  BP: 136/74 (!) 149/78  Pulse: 97 94  Resp: 14 16  Temp: 99.8 F (37.7 C) 99.5 F (37.5 C)  SpO2: 98% 96%   UO - 525 cc.   General: alert and cooperative Resp: clear to auscultation bilaterally Cardio: regular rate and rhythm, S1, S2 normal, no murmur, click, rub or gallop GI: soft, non-tender; bowel sounds normal; no masses,  no organomegaly and incision: clean, dry and intact Extremities: PAS on.  DPs. 2+ bilaterally.  Vaginal Bleeding: minimal  Assessment: s/p Procedure(s): TOTAL LAPAROSCOPIC HYSTERECTOMY WITH BILATERAL SALPINGECTOMY AND LYSIS OF ADHESIONS (Bilateral) CYSTOSCOPY (N/A): stable  Plan: Advance diet Advance to PO medication  Surgical findings and procedure reviewed.  Will continue in hospital care overnight.  CBC and BMP in am.   LOS: 0 days    Arloa Koh 11/14/2018, 5:35 PM

## 2018-11-14 NOTE — Plan of Care (Signed)
Patient admitted for post op care  

## 2018-11-14 NOTE — Anesthesia Postprocedure Evaluation (Signed)
Anesthesia Post Note  Patient: Andrea [name]  Procedure(s) Performed: TOTAL LAPAROSCOPIC HYSTERECTOMY WITH BILATERAL SALPINGECTOMY AND LYSIS OF ADHESIONS (Bilateral ) CYSTOSCOPY (N/A )     Patient location during evaluation: PACU Anesthesia Type: General Level of consciousness: awake and alert Pain management: pain level controlled Vital Signs Assessment: post-procedure vital signs reviewed and stable Respiratory status: spontaneous breathing, nonlabored ventilation and respiratory function stable Cardiovascular status: blood pressure returned to baseline and stable Postop Assessment: no apparent nausea or vomiting Anesthetic complications: no    Last Vitals:  Vitals:   11/14/18 1515 11/14/18 1622  BP: 136/74 (!) 149/78  Pulse: 97 94  Resp: 14 16  Temp: 37.7 C 37.5 C  SpO2: 98% 96%    Last Pain:  Vitals:   11/14/18 1622  TempSrc: Oral  PainSc:    Pain Goal: Patients Stated Pain Goal: 4 (11/14/18 1620)                 Lidia Collum

## 2018-11-14 NOTE — Transfer of Care (Signed)
Immediate Anesthesia Transfer of Care Note  Patient: Andrea [name]  Procedure(s) Performed: TOTAL LAPAROSCOPIC HYSTERECTOMY WITH BILATERAL SALPINGECTOMY AND LYSIS OF ADHESIONS (Bilateral ) CYSTOSCOPY (N/A )  Patient Location: PACU  Anesthesia Type:General  Level of Consciousness: awake, alert  and oriented  Airway & Oxygen Therapy: Patient Spontanous Breathing and Patient connected to nasal cannula oxygen  Post-op Assessment: Report given to RN, Post -op Vital signs reviewed and stable and Patient moving all extremities X 4  Post vital signs: Reviewed and stable  Last Vitals:  Vitals Value Taken Time  BP 122/62 11/14/2018  1:22 PM  Temp    Pulse 95 11/14/2018  1:23 PM  Resp 12 11/14/2018  1:23 PM  SpO2 97 % 11/14/2018  1:23 PM  Vitals shown include unvalidated device data.  Last Pain:  Vitals:   11/14/18 0905  TempSrc: Oral  PainSc: 0-No pain      Patients Stated Pain Goal: 4 (45/62/56 3893)  Complications: No apparent anesthesia complications

## 2018-11-14 NOTE — Progress Notes (Signed)
Update to History and Physical  No marked change in status since office preop visit.  Her [place] and estradiol are consistent with perimenopause.  Her Hgb is normal. Patient examined.  OK to proceed with surgery.

## 2018-11-15 ENCOUNTER — Other Ambulatory Visit: Payer: 59

## 2018-11-15 ENCOUNTER — Encounter (HOSPITAL_COMMUNITY): Payer: Self-pay | Admitting: Obstetrics and Gynecology

## 2018-11-15 DIAGNOSIS — D251 Intramural leiomyoma of uterus: Secondary | ICD-10-CM | POA: Diagnosis not present

## 2018-11-15 LAB — BASIC METABOLIC PANEL WITH GFR
Anion gap: 8 (ref 5–15)
BUN: 14 mg/dL (ref 6–20)
CO2: 25 mmol/L (ref 22–32)
Calcium: 8.7 mg/dL — ABNORMAL LOW (ref 8.9–10.3)
Chloride: 101 mmol/L (ref 98–111)
Creatinine, Ser: 0.92 mg/dL (ref 0.44–1.00)
GFR calc Af Amer: >60 mL/min
GFR calc non Af Amer: >60 mL/min
Glucose, Bld: 107 mg/dL — ABNORMAL HIGH (ref 70–99)
Potassium: 4.3 mmol/L (ref 3.5–5.1)
Sodium: 134 mmol/L — ABNORMAL LOW (ref 135–145)

## 2018-11-15 LAB — CBC
HCT: 36.3 % (ref 36.0–46.0)
Hemoglobin: 11.6 g/dL — ABNORMAL LOW (ref 12.0–15.0)
MCH: 28.1 pg (ref 26.0–34.0)
MCHC: 32 g/dL (ref 30.0–36.0)
MCV: 87.9 fL (ref 80.0–100.0)
Platelets: 222 10*3/uL (ref 150–400)
RBC: 4.13 MIL/uL (ref 3.87–5.11)
RDW: 21.5 % — ABNORMAL HIGH (ref 11.5–15.5)
WBC: 8.9 10*3/uL (ref 4.0–10.5)
nRBC: 0 % (ref 0.0–0.2)

## 2018-11-15 MED ORDER — OXYCODONE-ACETAMINOPHEN 5-325 MG PO TABS: 1.0000 | ORAL_TABLET | ORAL | 0 refills | Status: DC | PRN

## 2018-11-15 MED ORDER — IBUPROFEN 800 MG PO TABS: 800.0000 mg | ORAL_TABLET | Freq: Three times a day (TID) | ORAL | 0 refills | Status: DC | PRN

## 2018-11-15 NOTE — Discharge Instructions (Signed)
Total Laparoscopic Hysterectomy, Care After °This sheet gives you information about how to care for yourself after your procedure. Your health care provider may also give you more specific instructions. If you have problems or questions, contact your health care provider. °What can I expect after the procedure? °After the procedure, it is common to have: °· Pain and bruising around your incisions. °· A sore throat, if a breathing tube was used during surgery. °· Fatigue. °· Poor appetite. °· Less interest in sex. °If your ovaries were also removed, it is also common to have symptoms of menopause such as hot flashes, night sweats, and lack of sleep (insomnia). °Follow these instructions at home: °Bathing °· Do not take baths, swim, or use a hot tub until your health care provider approves. You may need to only take showers for 2-3 weeks. °· Keep your bandage (dressing) dry until your health care provider says it can be removed. °Incision care ° °· Follow instructions from your health care provider about how to take care of your incisions. Make sure you: °? Wash your hands with soap and water before you change your dressing. If soap and water are not available, use hand sanitizer. °? Change your dressing as told by your health care provider. °? Leave stitches (sutures), skin glue, or adhesive strips in place. These skin closures may need to stay in place for 2 weeks or longer. If adhesive strip edges start to loosen and curl up, you may trim the loose edges. Do not remove adhesive strips completely unless your health care provider tells you to do that. °· Check your incision area every day for signs of infection. Check for: °? Redness, swelling, or pain. °? Fluid or blood. °? Warmth. °? Pus or a bad smell. °Activity °· Get plenty of rest and sleep. °· Do not lift anything that is heavier than 10 lbs (4.5 kg) for one month after surgery, or as long as told by your health care provider. °· Do not drive or use heavy  machinery while taking prescription pain medicine. °· Do not drive for 24 hours if you were given a medicine to help you relax (sedative). °· Return to your normal activities as told by your health care provider. Ask your health care provider what activities are safe for you. °Lifestyle ° °· Do not use any products that contain nicotine or tobacco, such as cigarettes and e-cigarettes. These can delay healing. If you need help quitting, ask your health care provider. °· Do not drink alcohol until your health care provider approves. °General instructions °· Do not douche, use tampons, or have sex for at least 6 weeks, or as told by your health care provider. °· Take over-the-counter and prescription medicines only as told by your health care provider. °· To monitor yourself for a fever, take your temperature at least once a day during recovery. °· If you struggle with physical or emotional changes after your procedure, speak with your health care provider or a therapist. °· To prevent or treat constipation while you are taking prescription pain medicine, your health care provider may recommend that you: °? Drink enough fluid to keep your urine clear or pale yellow. °? Take over-the-counter or prescription medicines. °? Eat foods that are high in fiber, such as fresh fruits and vegetables, whole grains, and beans. °? Limit foods that are high in fat and processed sugars, such as fried and sweet foods. °· Keep all follow-up visits as told by your health care provider.   This is important. °Contact a health care provider if: °· You have chills or a fever. °· You have redness, swelling, or pain around an incision. °· You have fluid or blood coming from an incision. °· Your incision feels warm to the touch. °· You have pus or a bad smell coming from an incision. °· An incision breaks open. °· You feel dizzy or light-headed. °· You have pain or bleeding when you urinate. °· You have diarrhea, nausea, or vomiting that does not  go away. °· You have abnormal vaginal discharge. °· You have a rash. °· You have pain that does not get better with medicine. °Get help right away if: °· You have a fever and your symptoms suddenly get worse. °· You have severe abdominal pain. °· You have chest pain. °· You have shortness of breath. °· You faint. °· You have pain, swelling, or redness on your leg. °· You have heavy vaginal bleeding with blood clots. °Summary °· After the procedure it is common to have abdominal pain. Your provider will give you medication for this. °· Do not take baths, swim, or use a hot tub until your health care provider approves. °· Do not lift anything that is heavier than 10 lbs (4.5 kg) for one month after surgery, or as long as told by your health care provider. °· Notify your provider if you have any signs or symptoms of infection after the procedure. °This information is not intended to replace advice given to you by your health care provider. Make sure you discuss any questions you have with your health care provider. °Document Released: 08/02/2013 Document Revised: 12/23/2016 Document Reviewed: 12/23/2016 °Elsevier Interactive Patient Education © 2019 [employer] ° °

## 2018-11-15 NOTE — Progress Notes (Signed)
1 Day Post-Op Procedure(s) (LRB): TOTAL LAPAROSCOPIC HYSTERECTOMY WITH BILATERAL SALPINGECTOMY AND LYSIS OF ADHESIONS (Bilateral) CYSTOSCOPY (N/A)  Subjective: Patient reports tolerating PO, + flatus and no problems voiding.   Wants discharge home.  Percocet working well.   Objective: I have reviewed patient's vital signs, intake and output and labs. Vitals:   11/14/18 2329 11/15/18 0452  BP: (!) 111/59 132/73  Pulse: 97 88  Resp: 17 17  Temp: 99.3 F (37.4 C) 99.2 F (37.3 C)  SpO2: 95% 96%   UO 1625 cc.  CBC    Component Value Date/Time   WBC 8.9 11/15/2018 0453   RBC 4.13 11/15/2018 0453   HGB 11.6 (L) 11/15/2018 0453   HGB 13.6 10/28/2018 1155   HCT 36.3 11/15/2018 0453   HCT 42.8 10/28/2018 1155   PLT 222 11/15/2018 0453   PLT 291 10/28/2018 1155   MCV 87.9 11/15/2018 0453   MCV 86 10/28/2018 1155   MCH 28.1 11/15/2018 0453   MCHC 32.0 11/15/2018 0453   RDW 21.5 (H) 11/15/2018 0453   RDW 13.1 01/05/2018 1451   LYMPHSABS 1.6 09/09/2018 1204   MONOABS 0.4 09/09/2018 1204   EOSABS 0.3 09/09/2018 1204   BASOSABS 0.1 09/09/2018 1204      General: alert and cooperative Resp: clear to auscultation bilaterally Cardio: regular rate and rhythm, S1, S2 normal, no murmur, click, rub or gallop GI: soft, non-tender; bowel sounds normal; no masses,  no organomegaly and incision: clean, dry and intact Extremities: PAS off at this time.  Vaginal Bleeding: minimal  Assessment: s/p Procedure(s): TOTAL LAPAROSCOPIC HYSTERECTOMY WITH BILATERAL SALPINGECTOMY AND LYSIS OF ADHESIONS (Bilateral) CYSTOSCOPY (N/A): progressing well  Ready for discharge.   Plan: Discharge home  Rx for Percocet and Motrin.  Instructions and precautions reviewed.  Surgical findings and procedure reviewed.  Questions answered.  Fu in one week.    LOS: 0 days    Arloa Koh 11/15/2018, 7:17 AM

## 2018-11-15 NOTE — Addendum Note (Signed)
Addendum  created 11/15/18 0927 by Flossie Dibble, CRNA   Clinical Note Signed

## 2018-11-15 NOTE — Anesthesia Postprocedure Evaluation (Signed)
Anesthesia Post Note  Patient: Andrea [name]  Procedure(s) Performed: TOTAL LAPAROSCOPIC HYSTERECTOMY WITH BILATERAL SALPINGECTOMY AND LYSIS OF ADHESIONS (Bilateral ) CYSTOSCOPY (N/A )     Patient location during evaluation: [facility] Anesthesia Type: General Level of consciousness: awake and alert Pain management: satisfactory to patient Vital Signs Assessment: post-procedure vital signs reviewed and stable Respiratory status: spontaneous breathing and respiratory function stable Cardiovascular status: stable Postop Assessment: adequate PO intake Anesthetic complications: no    Last Vitals:  Vitals:   11/14/18 2329 11/15/18 0452  BP: (!) 111/59 132/73  Pulse: 97 88  Resp: 17 17  Temp: 37.4 C 37.3 C  SpO2: 95% 96%    Last Pain:  Vitals:   11/15/18 0745  TempSrc:   PainSc: 7    Pain Goal: Patients Stated Pain Goal: 2 (11/15/18 0745)                 Katherina Mires

## 2018-11-15 NOTE — Progress Notes (Signed)
Patient discharged home with husband. Prescriptions reviewed; Admission discussed; Medications discussed; Discharge instructions reviewed; Pain management discussed; Follow-up care reviewed. Pt verbalized understanding.

## 2018-11-18 NOTE — Progress Notes (Signed)
GYNECOLOGY  VISIT   HPI: 48 y.o.   Married  Caucasian  female   [phone] with No LMP recorded (lmp unknown). Patient has had a hysterectomy.   here for 1 week follow up [place] SALPINGECTOMY AND LYSIS OF ADHESIONS (Bilateral ) CYSTOSCOPY (N/A ).  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  Benign secretory endometrium.  Uterine fibroids. Benign fallopian tubes.  Patient states she doesn't feel like she's emptying bladder completely--as if she may be getting an UTI.  Taking Percocet once a day.  Using Motrin still.   Tender right sided incision.   BMs returned.   Double voiding.  Voiding often.  Tingling to void.   No vaginal spotting.   Urine dip - negative.    GYNECOLOGIC HISTORY: No LMP recorded (lmp unknown). Patient has had a hysterectomy. Contraception:  Tubal/Hysterectomy Menopausal hormone therapy:  none Last mammogram:  03/15/18 BIRADS 1 negative/density b Last pap smear: 08/29/18 dysplastic squamous cells of cervix; 09/05/18 Neg:Neg HR HPV; 09/05/18 ECC Negative; 09/20/18 CIN II w/ positive margin of the conization -- endometrial curettage negative and normal        OB History    Gravida  3   Para  2   Term      Preterm      AB  1   Living  2     SAB  1   TAB      Ectopic      Multiple      Live Births                 Patient Active Problem List   Diagnosis Date Noted  . Status post laparoscopic hysterectomy 11/14/2018  . Iron deficiency anemia 09/12/2018  . Menorrhagia with irregular cycle 08/25/2018  . Pelvic pain 08/25/2018  . Dyspareunia in female 08/25/2018  . Vertigo 09/15/2017    Past Medical History:  Diagnosis Date  . Abnormal Pap smear of cervix    2010-2014 abnormal pap every 3-6 months   dysplastic squamous cells of cervix   . Anxiety    occ.- no meds  . Blood in stool   . Depression    no meds  . DUB (dysfunctional uterine bleeding)   . Dyspnea    Resolved - due to  anemia - no current problems  . Eczema   . Endometriosis   . Epigastric pain   . Fatigue   . Fibroid   . GERD (gastroesophageal reflux disease)   . Grade I diastolic dysfunction 29/79/8921   Noted on ECHO  . Hemorrhoid    2nd and 3rd degree  . History of adenomatous polyp of colon   . History of palpitations   . HSV infection   . Hyperlipidemia    diet controlled - no meds  . Hypertension    not currently taking medication  . Infertility, female   . Iron deficiency anemia    Last iron infusion 09/14/2018  . Left groin hernia    history of  . Muscle cramping    with exertion  . Recurrent bacterial infection    Vaginal  . Restless leg    due to anemia  . Smoker   . SVD (spontaneous vaginal delivery)    x 2  . Thoracic back pain    while trying to sleep  . Tinnitus, left    hears heart beat, MRI normal  . Vertigo     Past Surgical History:  Procedure  Laterality Date  . ABLATION ON ENDOMETRIOSIS  1995  . CERVICAL BIOPSY  W/ LOOP ELECTRODE EXCISION    . CERVICAL CONIZATION W/BX N/A 09/20/2018   Procedure: CONIZATION CERVIX WITH BIOPSY;  Surgeon: Nunzio Cobbs, MD;  Location: [facility];  Service: Gynecology;  Laterality: N/A;  . COLONOSCOPY  06/2018   x 4  . COLPOSCOPY     x 3  . CYSTOSCOPY N/A 11/14/2018   Procedure: CYSTOSCOPY;  Surgeon: Nunzio Cobbs, MD;  Location: [facility];  Service: Gynecology;  Laterality: N/A;  . DILATION AND CURETTAGE OF UTERUS  1993  . DILATION AND CURETTAGE OF UTERUS N/A 09/20/2018   Procedure: FRACTIONAL DILATATION AND CURETTAGE;  Surgeon: Nunzio Cobbs, MD;  Location: [facility];  Service: Gynecology;  Laterality: N/A;  . GYNECOLOGIC CRYOSURGERY    . HEMORRHOID BANDING     several  . HERNIA REPAIR Left    childhood  . INTRAUTERINE DEVICE (IUD) INSERTION  2013    (Mirena)  . INTRAUTERINE DEVICE INSERTION  2000   [place]  . IUD REMOVAL  2010  . IUD REMOVAL  2018  .  TOTAL LAPAROSCOPIC HYSTERECTOMY WITH SALPINGECTOMY Bilateral 11/14/2018   Procedure: TOTAL LAPAROSCOPIC HYSTERECTOMY WITH BILATERAL SALPINGECTOMY AND LYSIS OF ADHESIONS;  Surgeon: Nunzio Cobbs, MD;  Location: [facility];  Service: Gynecology;  Laterality: Bilateral;  . TUBAL LIGATION     2010  . UPPER GI ENDOSCOPY  06/2018   x 2  . WISDOM TOOTH EXTRACTION      Current Outpatient Medications  Medication Sig Dispense Refill  . bisacodyl (DULCOLAX) 10 MG suppository Place 10 mg rectally daily.    Marland Kitchen ibuprofen (ADVIL,MOTRIN) 800 MG tablet Take 1 tablet (800 mg total) by mouth every 8 (eight) hours as needed. 30 tablet 0  . omeprazole (PRILOSEC) 40 MG capsule Take 40 mg by mouth every morning.     Marland Kitchen oxyCODONE-acetaminophen (PERCOCET/ROXICET) 5-325 MG tablet Take 1-2 tablets by mouth every 4 (four) hours as needed for moderate pain. 30 tablet 0   No current facility-administered medications for this visit.      ALLERGIES: Patient has no known allergies.  Family History  Problem Relation Age of Onset  . Cancer Mother 64       Dec.Stage IV colon ca 10/2013--age 53  . Heart disease Father   . Heart disease Paternal Grandfather   . Other Paternal Grandfather        Dec MVA  . Other Brother        Dec in a MVA age 8  . Alzheimer's disease Maternal Grandfather        Dec  . Other Maternal Grandfather   . Other Paternal Grandmother        Dec MVA  . Heart failure Paternal Aunt        Stage 4 colon cancer    Social History   Socioeconomic History  . Marital status: Married    Spouse name: Not on file  . Number of children: 2  . Years of education: Not on file  . Highest education level: Not on file  Occupational History  . Not on file  Social Needs  . Financial resource strain: Not on file  . Food insecurity:    Worry: Not on file    Inability: Not on file  . Transportation needs:    Medical: Not on file    Non-medical: Not on file  Tobacco Use  . Smoking status:  Current Every Day Smoker    Packs/day: 0.75    Years: 35.00    Pack years: 26.25    Types: Cigarettes  . Smokeless tobacco: Never Used  Substance and Sexual Activity  . Alcohol use: Yes    Comment: 6-7 drinks a week (alcohol)  Red Bull and Vodka or red wine  . Drug use: Not Currently  . Sexual activity: Yes    Partners: Male    Birth control/protection: Surgical    Comment: BTL  Lifestyle  . Physical activity:    Days per week: Not on file    Minutes per session: Not on file  . Stress: Not on file  Relationships  . Social connections:    Talks on phone: Not on file    Gets together: Not on file    Attends religious service: Not on file    Active member of club or organization: Not on file    Attends meetings of clubs or organizations: Not on file    Relationship status: Not on file  . Intimate partner violence:    Fear of current or ex partner: Not on file    Emotionally abused: Not on file    Physically abused: Not on file    Forced sexual activity: Not on file  Other Topics Concern  . Not on file  Social History Narrative  . Not on file    Review of Systems  Genitourinary: Positive for frequency.       Not emptying bladder completely  All other systems reviewed and are negative.   PHYSICAL EXAMINATION:    BP 138/84 (BP Location: Right Arm, Patient Position: Sitting, Cuff Size: Large)   Pulse 72   Ht 5\' 7"  (1.702 m)   Wt 232 lb (105.2 kg)   LMP  (LMP Unknown) Comment: LMP 06/2018  BMI 36.34 kg/m     General appearance: alert, cooperative and appears stated age   Abdomen: incisions intact.  Abdomen is soft, non-tender, no masses,  no organomegaly.   Pelvic:  Deferred.             Chaperone was present for exam.  ASSESSMENT  Status post laparoscopic hysterectomy.  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  Fibroids.   PLAN  Reviewed final pathology report and surgical procedure.  She understands she will need to do vaginal paps in the future  and that the hysterectomy does not remove HPV from her body.  Continue decreased activity.  FU for 6 week post op check.  Will do her annual exam in 6 months.    An After Visit Summary was printed and given to the patient.

## 2018-11-21 ENCOUNTER — Encounter: Payer: Self-pay | Admitting: Obstetrics and Gynecology

## 2018-11-21 ENCOUNTER — Other Ambulatory Visit: Payer: Self-pay

## 2018-11-21 ENCOUNTER — Ambulatory Visit (INDEPENDENT_AMBULATORY_CARE_PROVIDER_SITE_OTHER): Payer: 59 | Admitting: Obstetrics and Gynecology

## 2018-11-21 VITALS — BP 138/84 | HR 72 | Ht 67.0 in | Wt 232.0 lb

## 2018-11-21 DIAGNOSIS — Z9889 Other specified postprocedural states: Secondary | ICD-10-CM

## 2018-11-25 NOTE — Discharge Summary (Signed)
Physician Discharge Summary  Patient ID: Andrea [name] MRN: [id number] DOB/AGE: 1970-11-22 48 y.o.  Admit date: 11/14/2018 Discharge date:  11/15/18.  Admission Diagnoses: 1.  Symptomatic fibroids.  2.  CIN II, status post cold knife conization of the cervix with positive margin and positive ECC.  Discharge Diagnoses:  1.  Symptomatic fibroids.  2.  CIN II, status post cold knife conization of the cervix with positive margin and positive ECC. 3.  Status post total laparoscopic hysterectomy with bilateral salpingectomy, cystoscopy, lysis of adhesions.   Active Problems:   Status post laparoscopic hysterectomy   Discharged Condition: good  Hospital Course: The patient was admitted on 11/14/18  for a total laparoscopic hysterectomy with bilateral salpingectomy, cystoscopy, lysis of adhesions.   which were performed without complication while under general anesthesia.  The patient's post op course was uneventful.  She had a morphine IV andToradol for pain control initially, and this was converted over to Percocet and Motrin when the patient began taking po well.  She ambulated independently and wore PAS and Ted hose for DVT prophylaxis while in bed. She received Lovenox for DVT prophylaxis as well. Her foley catheter were removed at the termination of surgery, and she voided good volumes spontaneously following this. The patient's vital signs remained stable and she demonstrated no signs of infection during her hospitalization.  The patient's post op day one Hgb was 11.6.   She was tolerating the this well.  She had very minimal vaginal bleeding, and her incision(s) demonstrated no signs of erythema or significant drainage.  She was found to be in good condition and ready for discharge on post op day one.   Consults: None  Significant Diagnostic Studies: labs:  See Hospital course.  Treatments: surgery:  total laparoscopic hysterectomy with bilateral salpingectomy, cystoscopy, lysis of  adhesions.   Discharge Exam: Blood pressure 132/73, pulse 88, temperature 99.2 F (37.3 C), temperature source Oral, resp. rate 17, height 5\' 7"  (1.702 m), weight 106 kg, SpO2 96 %.  General: alert and cooperative Resp: clear to auscultation bilaterally Cardio: regular rate and rhythm, S1, S2 normal, no murmur, click, rub or gallop GI: soft, non-tender; bowel sounds normal; no masses,  no organomegaly and incision: clean, dry and intact Extremities: PAS off at this time.  Vaginal Bleeding: minimal  Disposition: Discharge disposition: 01-Home or Self Care     Discharge instructions were reviewed in verbal and written form.   Discharge Instructions    Discharge patient   Complete by:  As directed    Discharge disposition:  01-Home or Self Care   Discharge patient date:  11/15/2018     Allergies as of 11/15/2018   No Known Allergies     Medication List    STOP taking these medications   acetaminophen 325 MG tablet Commonly known as:  TYLENOL     TAKE these medications   ibuprofen 800 MG tablet Commonly known as:  ADVIL,MOTRIN Take 1 tablet (800 mg total) by mouth every 8 (eight) hours as needed. What changed:    medication strength  reasons to take this   omeprazole 40 MG capsule Commonly known as:  PRILOSEC Take 40 mg by mouth every morning.   oxyCODONE-acetaminophen 5-325 MG tablet Commonly known as:  PERCOCET/ROXICET Take 1-2 tablets by mouth every 4 (four) hours as needed for moderate pain.      Follow-up Information    Nunzio Cobbs, MD In 1 week.   Specialty:  Obstetrics and Gynecology Contact  information: [address] [place] [address] [phone]           Signed: Arloa Koh 11/25/2018, 11:49 AM

## 2018-11-29 ENCOUNTER — Telehealth: Payer: Self-pay | Admitting: Emergency Medicine

## 2018-11-29 NOTE — Telephone Encounter (Signed)
Spoke with patient and instructions from Dr. Talbert Nan discussed.  Pt in agreement with plan.  Will monitor symptoms closely and go to closest ED if increase in symptoms.  Otherwise, appointment for tomorrow with Dr. Quincy Simmonds confirmed.

## 2018-11-29 NOTE — Telephone Encounter (Signed)
Spoke with patient at incoming call.  C/o "more abdominal pain now than before."  States she has pain "in the center of my belly, not near the incisions."   Increased activity yesterday, made beds and vacuumed.   Not taking motrin or percocet for pain.  Passing urine normally. No fevers. Tolerating diet, but feels she is eating less. Bowel movements more frequent per patient, not loose but toothpaste consistency. Passing flatus, but sometimes she states that is hard to do.   + Nausea, no vomiting.   Declines at time of phone call to seek care at closest ED, pt lives in [place], [place].   As patient declines ED disposition at this time will review with Dr. Talbert Nan.

## 2018-11-29 NOTE — Telephone Encounter (Signed)
The patient recently stopped her pain medication. No fevers.  Declines ER visit now. Has an appointment to see Dr Quincy Simmonds tomorrow. Recommend she monitor for fevers, take Ibuprofen as needed. If she has worsening pain, fevers or any other concerns she needs to go to the ER.

## 2018-11-30 ENCOUNTER — Encounter: Payer: Self-pay | Admitting: Obstetrics and Gynecology

## 2018-11-30 ENCOUNTER — Ambulatory Visit (INDEPENDENT_AMBULATORY_CARE_PROVIDER_SITE_OTHER): Payer: 59 | Admitting: Obstetrics and Gynecology

## 2018-11-30 VITALS — BP 122/80 | HR 76 | Temp 98.7°F | Resp 16 | Ht 67.0 in | Wt 230.0 lb

## 2018-11-30 DIAGNOSIS — G8918 Other acute postprocedural pain: Secondary | ICD-10-CM

## 2018-11-30 NOTE — Progress Notes (Signed)
GYNECOLOGY  VISIT   HPI: 48 y.o.   Married  Caucasian  female   [phone] with No LMP recorded (lmp unknown). Patient has had a hysterectomy.   here for 2 week follow up/post op pain TOTAL LAPAROSCOPIC HYSTERECTOMY WITH BILATERAL SALPINGECTOMY AND LYSIS OF ADHESIONS (Bilateral ) CYSTOSCOPY (N/A ) - 11/14/18.   Felt like her pain was getting worse.  It is medial to her left sided abdominal incision and is in the skin.  Sore to the touch but also numb.  Feels like she is feeling every intestinal movement.   She was getting the house ready for cleaners 2 days ago.  Did house chores for about 4 hours - picking up things, changed sheets, laundry.   Started driving yesterday.  Hurt to close the car door.   Waves of nausea, which are not frequent.  Wonders if she is having a stomach virus.  Her BMs are soft and pasty.  She has blood in her BMs, which is not unusual as she has hemorrhoids and has frequent evaluation with her GI for this.  Not taking Dulcolax since stopping the Percocet.   Denies fever.   Voiding well, but it feels a little different.  Had a negative urine dip on 11/21/18.   She is feeling better since restarting her ibuprofen  5 - 6 days ago.  Not taking Percocet since the first week post op.     GYNECOLOGIC HISTORY: No LMP recorded (lmp unknown). Patient has had a hysterectomy. Contraception: Tubal/Hysterectomy Menopausal hormone therapy:  n/a Last mammogram:  03/15/18 BIRADS 1 negative/density b Last pap smear:  11/4/19dysplastic squamous cells of cervix;09/05/18 Neg:Neg HR HPV; 09/05/18 ECC Negative; 09/20/18 CIN II w/ positive margin of the conization -- endometrial curettage negative and normal        OB History    Gravida  3   Para  2   Term      Preterm      AB  1   Living  2     SAB  1   TAB      Ectopic      Multiple      Live Births                 Patient Active Problem List   Diagnosis Date Noted  . Status post laparoscopic  hysterectomy 11/14/2018  . Iron deficiency anemia 09/12/2018  . Menorrhagia with irregular cycle 08/25/2018  . Pelvic pain 08/25/2018  . Dyspareunia in female 08/25/2018  . Vertigo 09/15/2017    Past Medical History:  Diagnosis Date  . Abnormal Pap smear of cervix    2010-2014 abnormal pap every 3-6 months   dysplastic squamous cells of cervix   . Anxiety    occ.- no meds  . Blood in stool   . Depression    no meds  . DUB (dysfunctional uterine bleeding)   . Dyspnea    Resolved - due to anemia - no current problems  . Eczema   . Endometriosis   . Epigastric pain   . Fatigue   . Fibroid   . GERD (gastroesophageal reflux disease)   . Grade I diastolic dysfunction 30/86/5784   Noted on ECHO  . Hemorrhoid    2nd and 3rd degree  . History of adenomatous polyp of colon   . History of palpitations   . HSV infection   . Hyperlipidemia    diet controlled - no meds  . Hypertension  not currently taking medication  . Infertility, female   . Iron deficiency anemia    Last iron infusion 09/14/2018  . Left groin hernia    history of  . Muscle cramping    with exertion  . Recurrent bacterial infection    Vaginal  . Restless leg    due to anemia  . Smoker   . SVD (spontaneous vaginal delivery)    x 2  . Thoracic back pain    while trying to sleep  . Tinnitus, left    hears heart beat, MRI normal  . Vertigo     Past Surgical History:  Procedure Laterality Date  . ABLATION ON ENDOMETRIOSIS  1995  . CERVICAL BIOPSY  W/ LOOP ELECTRODE EXCISION    . CERVICAL CONIZATION W/BX N/A 09/20/2018   Procedure: CONIZATION CERVIX WITH BIOPSY;  Surgeon: Nunzio Cobbs, MD;  Location: [facility];  Service: Gynecology;  Laterality: N/A;  . COLONOSCOPY  06/2018   x 4  . COLPOSCOPY     x 3  . CYSTOSCOPY N/A 11/14/2018   Procedure: CYSTOSCOPY;  Surgeon: Nunzio Cobbs, MD;  Location: [facility];  Service: Gynecology;  Laterality: N/A;  . DILATION AND  CURETTAGE OF UTERUS  1993  . DILATION AND CURETTAGE OF UTERUS N/A 09/20/2018   Procedure: FRACTIONAL DILATATION AND CURETTAGE;  Surgeon: Nunzio Cobbs, MD;  Location: [facility];  Service: Gynecology;  Laterality: N/A;  . GYNECOLOGIC CRYOSURGERY    . HEMORRHOID BANDING     several  . HERNIA REPAIR Left    childhood  . INTRAUTERINE DEVICE (IUD) INSERTION  2013    (Mirena)  . INTRAUTERINE DEVICE INSERTION  2000   [facility]  . IUD REMOVAL  2010  . IUD REMOVAL  2018  . TOTAL LAPAROSCOPIC HYSTERECTOMY WITH SALPINGECTOMY Bilateral 11/14/2018   Procedure: TOTAL LAPAROSCOPIC HYSTERECTOMY WITH BILATERAL SALPINGECTOMY AND LYSIS OF ADHESIONS;  Surgeon: Nunzio Cobbs, MD;  Location: [facility];  Service: Gynecology;  Laterality: Bilateral;  . TUBAL LIGATION     2010  . UPPER GI ENDOSCOPY  06/2018   x 2  . WISDOM TOOTH EXTRACTION      Current Outpatient Medications  Medication Sig Dispense Refill  . bisacodyl (DULCOLAX) 10 MG suppository Place 10 mg rectally daily.    Marland Kitchen ibuprofen (ADVIL,MOTRIN) 800 MG tablet Take 1 tablet (800 mg total) by mouth every 8 (eight) hours as needed. 30 tablet 0  . omeprazole (PRILOSEC) 40 MG capsule Take 40 mg by mouth every morning.     Marland Kitchen oxyCODONE-acetaminophen (PERCOCET/ROXICET) 5-325 MG tablet Take 1-2 tablets by mouth every 4 (four) hours as needed for moderate pain. 30 tablet 0   No current facility-administered medications for this visit.      ALLERGIES: Patient has no known allergies.  Family History  Problem Relation Age of Onset  . Cancer Mother 78       Dec.Stage IV colon ca 10/2013--age 26  . Heart disease Father   . Heart disease Paternal Grandfather   . Other Paternal Grandfather        Dec MVA  . Other Brother        Dec in a MVA age 22  . Alzheimer's disease Maternal Grandfather        Dec  . Other Maternal Grandfather   . Other Paternal Grandmother        Dec MVA  . Heart failure Paternal Aunt  Stage 4 colon cancer    Social History   Socioeconomic History  . Marital status: Married    Spouse name: Not on file  . Number of children: 2  . Years of education: Not on file  . Highest education level: Not on file  Occupational History  . Not on file  Social Needs  . Financial resource strain: Not on file  . Food insecurity:    Worry: Not on file    Inability: Not on file  . Transportation needs:    Medical: Not on file    Non-medical: Not on file  Tobacco Use  . Smoking status: Current Every Day Smoker    Packs/day: 0.75    Years: 35.00    Pack years: 26.25    Types: Cigarettes  . Smokeless tobacco: Never Used  Substance and Sexual Activity  . Alcohol use: Yes    Comment: 6-7 drinks a week (alcohol)  Red Bull and Vodka or red wine  . Drug use: Not Currently  . Sexual activity: Yes    Partners: Male    Birth control/protection: Surgical    Comment: BTL  Lifestyle  . Physical activity:    Days per week: Not on file    Minutes per session: Not on file  . Stress: Not on file  Relationships  . Social connections:    Talks on phone: Not on file    Gets together: Not on file    Attends religious service: Not on file    Active member of club or organization: Not on file    Attends meetings of clubs or organizations: Not on file    Relationship status: Not on file  . Intimate partner violence:    Fear of current or ex partner: Not on file    Emotionally abused: Not on file    Physically abused: Not on file    Forced sexual activity: Not on file  Other Topics Concern  . Not on file  Social History Narrative  . Not on file    Review of Systems  Constitutional: Negative.   HENT: Negative.   Eyes: Negative.   Respiratory: Negative.   Cardiovascular: Negative.   Gastrointestinal: Positive for abdominal pain and blood in stool.       Bloating   Endocrine: Negative.   Genitourinary: Negative.   Musculoskeletal: Negative.   Skin: Negative.    Allergic/Immunologic: Negative.   Neurological: Negative.   Hematological: Negative.   Psychiatric/Behavioral: Negative.     PHYSICAL EXAMINATION:    BP 122/80 (BP Location: Right Arm, Patient Position: Sitting, Cuff Size: Large)   Pulse 76   Temp 98.7 F (37.1 C) (Oral)   Resp 16   Ht 5\' 7"  (1.702 m)   Wt 230 lb (104.3 kg)   LMP  (LMP Unknown) Comment: LMP 06/2018  BMI 36.02 kg/m     General appearance: alert, cooperative and appears stated age   Abdomen: incisions intact.  No subcutaneous massed noted.  Abdomen is soft, non-tender, no masses,  no organomegaly  Pelvic: External genitalia:  no lesions              Urethra:  normal appearing urethra with no masses, tenderness or lesions              Bartholins and Skenes: normal                 Vagina: normal appearing vagina with normal color and discharge, no lesions  Cervix:  Absent.  Cuff intact.                 Bimanual Exam:  Uterus:  Absent.               Adnexa: no mass, fullness, tenderness              Chaperone was present for exam.  ASSESSMENT  Post op incisional pain. No evidence of hernia.  No acute abdomen.  No signs of C diff.   PLAN  We talked about the pathway for the laparoscopic trocars and why the pain if more medial to her skin incision.  I recommend she decrease her activity.  She will try extra strength Tylenol instead of the ibuprofen.  Call for fever, nausea/vomiting/lack of passing gas, watery diarrhea, heavy vaginal bleeding.  FU for 6 week post op visit.   An After Visit Summary was printed and given to the patient.

## 2018-12-23 ENCOUNTER — Ambulatory Visit: Payer: 59 | Admitting: Obstetrics and Gynecology

## 2019-01-05 ENCOUNTER — Ambulatory Visit (INDEPENDENT_AMBULATORY_CARE_PROVIDER_SITE_OTHER): Payer: 59 | Admitting: Obstetrics and Gynecology

## 2019-01-05 ENCOUNTER — Other Ambulatory Visit: Payer: Self-pay

## 2019-01-05 ENCOUNTER — Encounter: Payer: Self-pay | Admitting: Obstetrics and Gynecology

## 2019-01-05 VITALS — BP 112/60 | HR 76 | Ht 67.0 in | Wt 228.0 lb

## 2019-01-05 DIAGNOSIS — Z9889 Other specified postprocedural states: Secondary | ICD-10-CM

## 2019-01-05 NOTE — Progress Notes (Signed)
GYNECOLOGY  VISIT   HPI: 48 y.o.   Married  Caucasian  female   [phone] with No LMP recorded (lmp unknown). Patient has had a hysterectomy.   Here for 6 week post op check.   Doing well.  Denies any problems.  Wants to resume sexual activity and exercise at the gym.   New job starting.   GYNECOLOGIC HISTORY: No LMP recorded (lmp unknown). Patient has had a hysterectomy. Contraception:  Hysterectomy  Menopausal hormone therapy:  none Last mammogram:  03/15/18 BIRADS 1 negative/denstiy b Last pap smear:   11/4/19dysplastic squamous cells of cervix;09/05/18 Neg:Neg HR HPV; 09/05/18 ECC Negative; 09/20/18 CIN II w/ positive margin of the conization -- endometrial curettage negative and normal        OB History    Gravida  3   Para  2   Term      Preterm      AB  1   Living  2     SAB  1   TAB      Ectopic      Multiple      Live Births                 Patient Active Problem List   Diagnosis Date Noted  . Status post laparoscopic hysterectomy 11/14/2018  . Iron deficiency anemia 09/12/2018  . Pelvic pain 08/25/2018  . Dyspareunia in female 08/25/2018  . Vertigo 09/15/2017    Past Medical History:  Diagnosis Date  . Abnormal Pap smear of cervix    2010-2014 abnormal pap every 3-6 months   dysplastic squamous cells of cervix   . Anxiety    occ.- no meds  . Blood in stool   . Depression    no meds  . DUB (dysfunctional uterine bleeding)   . Dyspnea    Resolved - due to anemia - no current problems  . Eczema   . Endometriosis   . Epigastric pain   . Fatigue   . Fibroid   . GERD (gastroesophageal reflux disease)   . Grade I diastolic dysfunction 77/82/4235   Noted on ECHO  . Hemorrhoid    2nd and 3rd degree  . History of adenomatous polyp of colon   . History of palpitations   . HSV infection   . Hyperlipidemia    diet controlled - no meds  . Hypertension    not currently taking medication  . Infertility, female   . Iron deficiency  anemia    Last iron infusion 09/14/2018  . Left groin hernia    history of  . Muscle cramping    with exertion  . Recurrent bacterial infection    Vaginal  . Restless leg    due to anemia  . Smoker   . SVD (spontaneous vaginal delivery)    x 2  . Thoracic back pain    while trying to sleep  . Tinnitus, left    hears heart beat, MRI normal  . Vertigo     Past Surgical History:  Procedure Laterality Date  . ABLATION ON ENDOMETRIOSIS  1995  . CERVICAL BIOPSY  W/ LOOP ELECTRODE EXCISION    . CERVICAL CONIZATION W/BX N/A 09/20/2018   Procedure: CONIZATION CERVIX WITH BIOPSY;  Surgeon: Nunzio Cobbs, MD;  Location: [facility];  Service: Gynecology;  Laterality: N/A;  . COLONOSCOPY  06/2018   x 4  . COLPOSCOPY     x 3  . CYSTOSCOPY N/A  11/14/2018   Procedure: CYSTOSCOPY;  Surgeon: Nunzio Cobbs, MD;  Location: [facility];  Service: Gynecology;  Laterality: N/A;  . DILATION AND CURETTAGE OF UTERUS  1993  . DILATION AND CURETTAGE OF UTERUS N/A 09/20/2018   Procedure: FRACTIONAL DILATATION AND CURETTAGE;  Surgeon: Nunzio Cobbs, MD;  Location: [facility];  Service: Gynecology;  Laterality: N/A;  . GYNECOLOGIC CRYOSURGERY    . HEMORRHOID BANDING     several  . HERNIA REPAIR Left    childhood  . INTRAUTERINE DEVICE (IUD) INSERTION  2013    (Mirena)  . INTRAUTERINE DEVICE INSERTION  2000   [facility]  . IUD REMOVAL  2010  . IUD REMOVAL  2018  . TOTAL LAPAROSCOPIC HYSTERECTOMY WITH SALPINGECTOMY Bilateral 11/14/2018   Procedure: TOTAL LAPAROSCOPIC HYSTERECTOMY WITH BILATERAL SALPINGECTOMY AND LYSIS OF ADHESIONS;  Surgeon: Nunzio Cobbs, MD;  Location: [facility];  Service: Gynecology;  Laterality: Bilateral;  . TUBAL LIGATION     2010  . UPPER GI ENDOSCOPY  06/2018   x 2  . WISDOM TOOTH EXTRACTION      Current Outpatient Medications  Medication Sig Dispense Refill  . bisacodyl (DULCOLAX) 10 MG suppository  Place 10 mg rectally daily.    Marland Kitchen ibuprofen (ADVIL,MOTRIN) 800 MG tablet Take 1 tablet (800 mg total) by mouth every 8 (eight) hours as needed. 30 tablet 0  . omeprazole (PRILOSEC) 40 MG capsule Take 40 mg by mouth every morning.     Marland Kitchen oxyCODONE-acetaminophen (PERCOCET/ROXICET) 5-325 MG tablet Take 1-2 tablets by mouth every 4 (four) hours as needed for moderate pain. (Patient not taking: Reported on 11/30/2018) 30 tablet 0   No current facility-administered medications for this visit.      ALLERGIES: Patient has no known allergies.  Family History  Problem Relation Age of Onset  . Cancer Mother 68       Dec.Stage IV colon ca 10/2013--age 90  . Heart disease Father   . Heart disease Paternal Grandfather   . Other Paternal Grandfather        Dec MVA  . Other Brother        Dec in a MVA age 19  . Alzheimer's disease Maternal Grandfather        Dec  . Other Maternal Grandfather   . Other Paternal Grandmother        Dec MVA  . Heart failure Paternal Aunt        Stage 4 colon cancer    Social History   Socioeconomic History  . Marital status: Married    Spouse name: Not on file  . Number of children: 2  . Years of education: Not on file  . Highest education level: Not on file  Occupational History  . Not on file  Social Needs  . Financial resource strain: Not on file  . Food insecurity:    Worry: Not on file    Inability: Not on file  . Transportation needs:    Medical: Not on file    Non-medical: Not on file  Tobacco Use  . Smoking status: Current Every Day Smoker    Packs/day: 0.75    Years: 35.00    Pack years: 26.25    Types: Cigarettes  . Smokeless tobacco: Never Used  Substance and Sexual Activity  . Alcohol use: Yes    Comment: 6-7 drinks a week (alcohol)  Red Bull and Vodka or red wine  . Drug use: Not  Currently  . Sexual activity: Yes    Partners: Male    Birth control/protection: Surgical    Comment: BTL  Lifestyle  . Physical activity:    Days per  week: Not on file    Minutes per session: Not on file  . Stress: Not on file  Relationships  . Social connections:    Talks on phone: Not on file    Gets together: Not on file    Attends religious service: Not on file    Active member of club or organization: Not on file    Attends meetings of clubs or organizations: Not on file    Relationship status: Not on file  . Intimate partner violence:    Fear of current or ex partner: Not on file    Emotionally abused: Not on file    Physically abused: Not on file    Forced sexual activity: Not on file  Other Topics Concern  . Not on file  Social History Narrative  . Not on file    Review of Systems  Constitutional: Negative.   HENT: Negative.   Eyes: Negative.   Respiratory: Negative.   Cardiovascular: Negative.   Gastrointestinal: Negative.   Endocrine: Negative.   Genitourinary: Negative.   Musculoskeletal: Negative.   Skin: Negative.   Allergic/Immunologic: Negative.   Neurological: Negative.   Hematological: Negative.   Psychiatric/Behavioral: Negative.     PHYSICAL EXAMINATION:    BP 112/60   Pulse 76   Ht 5\' 7"  (1.702 m)   Wt 228 lb (103.4 kg)   LMP  (LMP Unknown) Comment: LMP 06/2018  BMI 35.71 kg/m     General appearance: alert, cooperative and appears stated age   Abdomen: incisions intact.  Abdomen is soft, non-tender, no masses,  no organomegaly  Pelvic: External genitalia:  no lesions              Urethra:  normal appearing urethra with no masses, tenderness or lesions              Bartholins and Skenes: normal                 Vagina: normal appearing vagina with normal color and discharge, no lesions              Cervix:  Absent.  Suture line still healing.                 Bimanual Exam:  Uterus:  absent              Adnexa: no mass, fullness, tenderness            Chaperone was present for exam.  ASSESSMENT  Status post laparoscopic hysterectomy.  Doing well post op.   PLAN  No intercourse  or work outs for at least 2 additional weeks and has had me reassess her vaginal cuff.  FU for recheck in 2 -3 weeks.  Ok if longer due to her new work schedule.   An After Visit Summary was printed and given to the patient.

## 2019-01-18 ENCOUNTER — Ambulatory Visit: Payer: 59 | Admitting: Obstetrics and Gynecology

## 2019-01-30 ENCOUNTER — Telehealth: Payer: Self-pay | Admitting: Obstetrics and Gynecology

## 2019-01-30 NOTE — Telephone Encounter (Signed)
Patient calling to schedule post op appointment. Says she is fine but need the "ok" from doctor.

## 2019-01-30 NOTE — Telephone Encounter (Signed)
Spoke with patient, calling to schedule post op f/u for recheck of vaginal cuff.  Seen in office on 3/12, f/u 2-3 wks recommended. OV scheduled for 4/8 at 11am with Dr. Quincy Simmonds.   Routing to provider for final review. Patient is agreeable to disposition. Will close encounter.

## 2019-02-01 ENCOUNTER — Ambulatory Visit (INDEPENDENT_AMBULATORY_CARE_PROVIDER_SITE_OTHER): Payer: Self-pay | Admitting: Obstetrics and Gynecology

## 2019-02-01 ENCOUNTER — Encounter: Payer: Self-pay | Admitting: Obstetrics and Gynecology

## 2019-02-01 ENCOUNTER — Other Ambulatory Visit: Payer: Self-pay

## 2019-02-01 VITALS — BP 148/98 | HR 80 | Temp 98.4°F | Ht 67.0 in | Wt 227.8 lb

## 2019-02-01 DIAGNOSIS — Z9889 Other specified postprocedural states: Secondary | ICD-10-CM

## 2019-02-01 NOTE — Progress Notes (Signed)
GYNECOLOGY  VISIT   HPI: 48 y.o.   Married  Caucasian  female   [phone] with No LMP recorded (lmp unknown). Patient has had a hysterectomy.   here for vaginal cuff check.   Had a low grade fever 3 weeks ago.  She had a low grade fever for 2 nights.  No SOB or coughing. She called her PCP and she did not have any further follow up.  Feels well now.   Working from home.   Denies pain, unusual discharge, or vaginal bleeding.  Has some skin numbness on her left side.  No problems.   GYNECOLOGIC HISTORY: No LMP recorded (lmp unknown). Patient has had a hysterectomy. Contraception:  Hysterectomy.  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  Menopausal hormone therapy:  none Last mammogram:  03/15/18 BIRADS1:neg  Last pap smear:   09/05/18 Neg. HR HPV:neg         OB History    Gravida  3   Para  2   Term      Preterm      AB  1   Living  2     SAB  1   TAB      Ectopic      Multiple      Live Births                 Patient Active Problem List   Diagnosis Date Noted  . Iron deficiency anemia 09/12/2018  . Pelvic pain 08/25/2018  . Dyspareunia in female 08/25/2018  . Vertigo 09/15/2017    Past Medical History:  Diagnosis Date  . Abnormal Pap smear of cervix    2010-2014 abnormal pap every 3-6 months   dysplastic squamous cells of cervix   . Anxiety    occ.- no meds  . Blood in stool   . Depression    no meds  . DUB (dysfunctional uterine bleeding)   . Dyspnea    Resolved - due to anemia - no current problems  . Eczema   . Endometriosis   . Epigastric pain   . Fatigue   . Fibroid   . GERD (gastroesophageal reflux disease)   . Grade I diastolic dysfunction 37/07/6268   Noted on ECHO  . Hemorrhoid    2nd and 3rd degree  . History of adenomatous polyp of colon   . History of palpitations   . HSV infection   . Hyperlipidemia    diet controlled - no meds  . Hypertension    not currently taking medication  . Infertility, female   . Iron deficiency anemia     Last iron infusion 09/14/2018  . Left groin hernia    history of  . Muscle cramping    with exertion  . Recurrent bacterial infection    Vaginal  . Restless leg    due to anemia  . Smoker   . SVD (spontaneous vaginal delivery)    x 2  . Thoracic back pain    while trying to sleep  . Tinnitus, left    hears heart beat, MRI normal  . Vertigo     Past Surgical History:  Procedure Laterality Date  . ABLATION ON ENDOMETRIOSIS  1995  . CERVICAL BIOPSY  W/ LOOP ELECTRODE EXCISION    . CERVICAL CONIZATION W/BX N/A 09/20/2018   Procedure: CONIZATION CERVIX WITH BIOPSY;  Surgeon: Nunzio Cobbs, MD;  Location: [facility];  Service: Gynecology;  Laterality: N/A;  . COLONOSCOPY  06/2018   x 4  . COLPOSCOPY     x 3  . CYSTOSCOPY N/A 11/14/2018   Procedure: CYSTOSCOPY;  Surgeon: Nunzio Cobbs, MD;  Location: [facility];  Service: Gynecology;  Laterality: N/A;  . DILATION AND CURETTAGE OF UTERUS  1993  . DILATION AND CURETTAGE OF UTERUS N/A 09/20/2018   Procedure: FRACTIONAL DILATATION AND CURETTAGE;  Surgeon: Nunzio Cobbs, MD;  Location: [facility];  Service: Gynecology;  Laterality: N/A;  . GYNECOLOGIC CRYOSURGERY    . HEMORRHOID BANDING     several  . HERNIA REPAIR Left    childhood  . INTRAUTERINE DEVICE (IUD) INSERTION  2013    (Mirena)  . INTRAUTERINE DEVICE INSERTION  2000   [facility]  . IUD REMOVAL  2010  . IUD REMOVAL  2018  . TOTAL LAPAROSCOPIC HYSTERECTOMY WITH SALPINGECTOMY Bilateral 11/14/2018   Procedure: TOTAL LAPAROSCOPIC HYSTERECTOMY WITH BILATERAL SALPINGECTOMY AND LYSIS OF ADHESIONS;  Surgeon: Nunzio Cobbs, MD;  Location: [facility];  Service: Gynecology;  Laterality: Bilateral;  . TUBAL LIGATION     2010  . UPPER GI ENDOSCOPY  06/2018   x 2  . WISDOM TOOTH EXTRACTION      Current Outpatient Medications  Medication Sig Dispense Refill  . omeprazole (PRILOSEC) 40 MG capsule Take 40 mg by  mouth every morning.      No current facility-administered medications for this visit.      ALLERGIES: Patient has no known allergies.  Family History  Problem Relation Age of Onset  . Cancer Mother 29       Dec.Stage IV colon ca 10/2013--age 82  . Heart disease Father   . Heart disease Paternal Grandfather   . Other Paternal Grandfather        Dec MVA  . Other Brother        Dec in a MVA age 58  . Alzheimer's disease Maternal Grandfather        Dec  . Other Maternal Grandfather   . Other Paternal Grandmother        Dec MVA  . Heart failure Paternal Aunt        Stage 4 colon cancer    Social History   Socioeconomic History  . Marital status: Married    Spouse name: Not on file  . Number of children: 2  . Years of education: Not on file  . Highest education level: Not on file  Occupational History  . Not on file  Social Needs  . Financial resource strain: Not on file  . Food insecurity:    Worry: Not on file    Inability: Not on file  . Transportation needs:    Medical: Not on file    Non-medical: Not on file  Tobacco Use  . Smoking status: Current Every Day Smoker    Packs/day: 0.75    Years: 35.00    Pack years: 26.25    Types: Cigarettes  . Smokeless tobacco: Never Used  Substance and Sexual Activity  . Alcohol use: Yes    Comment: 6-7 drinks a week (alcohol)  Red Bull and Vodka or red wine  . Drug use: Not Currently  . Sexual activity: Yes    Partners: Male    Birth control/protection: Surgical    Comment: BTL  Lifestyle  . Physical activity:    Days per week: Not on file    Minutes per session: Not on file  . Stress: Not  on file  Relationships  . Social connections:    Talks on phone: Not on file    Gets together: Not on file    Attends religious service: Not on file    Active member of club or organization: Not on file    Attends meetings of clubs or organizations: Not on file    Relationship status: Not on file  . Intimate partner violence:     Fear of current or ex partner: Not on file    Emotionally abused: Not on file    Physically abused: Not on file    Forced sexual activity: Not on file  Other Topics Concern  . Not on file  Social History Narrative  . Not on file    Review of Systems  All other systems reviewed and are negative.   PHYSICAL EXAMINATION:    BP (!) 148/98   Pulse 80   Temp 98.4 F (36.9 C) (Oral)   Ht 5\' 7"  (1.702 m)   Wt 227 lb 12.8 oz (103.3 kg)   LMP  (LMP Unknown) Comment: LMP 06/2018  BMI 35.68 kg/m     General appearance: alert, cooperative and appears stated age.  Patient is wearing a cloth face mask.    Abdomen: incisions intact.  Abdomen is soft, non-tender, no masses,  no organomegaly    Pelvic: External genitalia:  no lesions              Urethra:  normal appearing urethra with no masses, tenderness or lesions              Bartholins and Skenes: normal                 Vagina: normal appearing vagina with normal color and discharge, no lesions              Cervix:  absent                Bimanual Exam:  Uterus:   Absent.              Adnexa: no mass, fullness, tenderness       Chaperone was present for exam.  ASSESSMENT  Status post laparoscopic hysterectomy - CIN II.  Healing well.   PLAN  Ok to resume all normal activities including sexual activity.  Lubricants discussed.  FU for annual exam in July 2020.  She will schedule her mammogram for the same time.    An After Visit Summary was printed and given to the patient.

## 2019-02-14 ENCOUNTER — Other Ambulatory Visit: Payer: 59

## 2019-02-14 ENCOUNTER — Ambulatory Visit: Payer: 59 | Admitting: Oncology

## 2019-03-23 ENCOUNTER — Other Ambulatory Visit: Payer: Self-pay

## 2019-03-24 ENCOUNTER — Ambulatory Visit: Payer: Self-pay | Admitting: Oncology

## 2019-03-26 ENCOUNTER — Other Ambulatory Visit: Payer: Self-pay | Admitting: *Deleted

## 2019-03-26 DIAGNOSIS — D509 Iron deficiency anemia, unspecified: Secondary | ICD-10-CM

## 2019-03-29 ENCOUNTER — Inpatient Hospital Stay: Payer: Managed Care, Other (non HMO)

## 2019-03-29 ENCOUNTER — Other Ambulatory Visit: Payer: Self-pay

## 2019-03-29 ENCOUNTER — Encounter: Payer: Self-pay | Admitting: Oncology

## 2019-03-30 ENCOUNTER — Encounter: Payer: Self-pay | Admitting: Oncology

## 2019-03-30 ENCOUNTER — Inpatient Hospital Stay: Payer: Managed Care, Other (non HMO) | Attending: Oncology | Admitting: Oncology

## 2019-03-30 DIAGNOSIS — Z90710 Acquired absence of both cervix and uterus: Secondary | ICD-10-CM

## 2019-03-30 DIAGNOSIS — F17210 Nicotine dependence, cigarettes, uncomplicated: Secondary | ICD-10-CM

## 2019-03-30 DIAGNOSIS — D509 Iron deficiency anemia, unspecified: Secondary | ICD-10-CM | POA: Diagnosis not present

## 2019-03-30 DIAGNOSIS — Z79899 Other long term (current) drug therapy: Secondary | ICD-10-CM

## 2019-03-30 NOTE — Progress Notes (Signed)
I connected with Andrea [name] on 03/30/19 at 10:30 AM EDT by video enabled telemedicine visit and verified that I am speaking with the correct person using two identifiers.   I discussed the limitations, risks, security and privacy concerns of performing an evaluation and management service by telemedicine and the availability of in-person appointments. I also discussed with the patient that there may be a patient responsible charge related to this service. The patient expressed understanding and agreed to proceed.  Other persons participating in the visit and their role in the encounter:  none  Patient's location:  home Provider's location:  home  Chief Complaint:  Routine follow up of iron deficiency anemia  History of present illness: Patient is a 48 year old female who has been referred to [place] for iron deficiency anemia. Her blood work from August 2019 showed H&H of 12.6/38.8 with an MCV of 90.7. White count and platelet count were normal. Patient reports that since then she has had a recent hemoglobin checked 2 to 3 weeks ago when her hemoglobin was 9. She has been taking oral iron on and off for the last couple of months but does report significant constipation and abdominal pain secondary to it. She did undergo EGD and colonoscopy which was apparently unremarkable. Patient also has a history of abnormal Pap smear and is scheduled to undergo colposcopy soon. She has been having heavy menstrual.'s and wanted to initially undergo hysterectomy which has been on hold due to her upcoming colposcopy.She reports feeling significantly fatigued at work but denies other complaints. Patient's mother had colon cancer at 25. Patient denies any changes in her appetite or unintentional weight loss. Denies any blood loss in her stool or urine. Denies any dark melanotic stools  Blood work from 10/19/2018 were as follows: CBC showed white count of 2, H&H of 10.2/35 with an MCV of 81 and a platelet  count of 386.  Ferritin levels were low at 9 and iron study showed a low iron saturation of 4% elevated TIBC of 4 9.  B12 and folate were within normal limits.  Urinalysis did not reveal hematuria.  Stool H. pylori antigen was negative.  She received feraheme in nov 2019. Also underwent hysterectomy in jan 2020.  Interval history: she still feels fatigued. Has intermitttent hemorrhoidal bleeding.    Review of Systems  Constitutional: Positive for malaise/fatigue. Negative for chills, fever and weight loss.  HENT: Negative for congestion, ear discharge and nosebleeds.   Eyes: Negative for blurred vision.  Respiratory: Negative for cough, hemoptysis, sputum production, shortness of breath and wheezing.   Cardiovascular: Negative for chest pain, palpitations, orthopnea and claudication.  Gastrointestinal: Positive for blood in stool. Negative for abdominal pain, constipation, diarrhea, heartburn, melena, nausea and vomiting.  Genitourinary: Negative for dysuria, flank pain, frequency, hematuria and urgency.  Musculoskeletal: Negative for back pain, joint pain and myalgias.  Skin: Negative for rash.  Neurological: Negative for dizziness, tingling, focal weakness, seizures, weakness and headaches.  Endo/Heme/Allergies: Does not bruise/bleed easily.  Psychiatric/Behavioral: Negative for depression and suicidal ideas. The patient does not have insomnia.     No Known Allergies  Past Medical History:  Diagnosis Date  . Abnormal Pap smear of cervix    2010-2014 abnormal pap every 3-6 months   dysplastic squamous cells of cervix   . Anxiety    occ.- no meds  . Blood in stool   . Depression    no meds  . DUB (dysfunctional uterine bleeding)   . Dyspnea  Resolved - due to anemia - no current problems  . Eczema   . Endometriosis   . Epigastric pain   . Fatigue   . Fibroid   . GERD (gastroesophageal reflux disease)   . Grade I diastolic dysfunction 85/27/7824   Noted on ECHO  .  Hemorrhoid    2nd and 3rd degree  . History of adenomatous polyp of colon   . History of palpitations   . HSV infection   . Hyperlipidemia    diet controlled - no meds  . Hypertension    not currently taking medication  . Infertility, female   . Iron deficiency anemia    Last iron infusion 09/14/2018  . Left groin hernia    history of  . Muscle cramping    with exertion  . Recurrent bacterial infection    Vaginal  . Restless leg    due to anemia  . Smoker   . SVD (spontaneous vaginal delivery)    x 2  . Thoracic back pain    while trying to sleep  . Tinnitus, left    hears heart beat, MRI normal  . Vertigo     Past Surgical History:  Procedure Laterality Date  . ABLATION ON ENDOMETRIOSIS  1995  . CERVICAL BIOPSY  W/ LOOP ELECTRODE EXCISION    . CERVICAL CONIZATION W/BX N/A 09/20/2018   Procedure: CONIZATION CERVIX WITH BIOPSY;  Surgeon: Nunzio Cobbs, MD;  Location: [facility];  Service: Gynecology;  Laterality: N/A;  . COLONOSCOPY  06/2018   x 4  . COLPOSCOPY     x 3  . CYSTOSCOPY N/A 11/14/2018   Procedure: CYSTOSCOPY;  Surgeon: Nunzio Cobbs, MD;  Location: [facility];  Service: Gynecology;  Laterality: N/A;  . DILATION AND CURETTAGE OF UTERUS  1993  . DILATION AND CURETTAGE OF UTERUS N/A 09/20/2018   Procedure: FRACTIONAL DILATATION AND CURETTAGE;  Surgeon: Nunzio Cobbs, MD;  Location: [facility];  Service: Gynecology;  Laterality: N/A;  . GYNECOLOGIC CRYOSURGERY    . HEMORRHOID BANDING     several  . HERNIA REPAIR Left    childhood  . INTRAUTERINE DEVICE (IUD) INSERTION  2013    (Mirena)  . INTRAUTERINE DEVICE INSERTION  2000   [facility]  . IUD REMOVAL  2010  . IUD REMOVAL  2018  . TOTAL LAPAROSCOPIC HYSTERECTOMY WITH SALPINGECTOMY Bilateral 11/14/2018   Procedure: TOTAL LAPAROSCOPIC HYSTERECTOMY WITH BILATERAL SALPINGECTOMY AND LYSIS OF ADHESIONS;  Surgeon: Nunzio Cobbs, MD;   Location: [facility];  Service: Gynecology;  Laterality: Bilateral;  . TUBAL LIGATION     2010  . UPPER GI ENDOSCOPY  06/2018   x 2  . WISDOM TOOTH EXTRACTION      Social History   Socioeconomic History  . Marital status: Married    Spouse name: Not on file  . Number of children: 2  . Years of education: Not on file  . Highest education level: Not on file  Occupational History  . Not on file  Social Needs  . Financial resource strain: Not on file  . Food insecurity:    Worry: Not on file    Inability: Not on file  . Transportation needs:    Medical: Not on file    Non-medical: Not on file  Tobacco Use  . Smoking status: Current Every Day Smoker    Packs/day: 0.75    Years: 35.00  Pack years: 26.25    Types: Cigarettes  . Smokeless tobacco: Never Used  Substance and Sexual Activity  . Alcohol use: Yes    Comment: 6-7 drinks a week (alcohol)  Red Bull and Vodka or red wine  . Drug use: Not Currently  . Sexual activity: Yes    Partners: Male    Birth control/protection: Surgical    Comment: BTL  Lifestyle  . Physical activity:    Days per week: Not on file    Minutes per session: Not on file  . Stress: Not on file  Relationships  . Social connections:    Talks on phone: Not on file    Gets together: Not on file    Attends religious service: Not on file    Active member of club or organization: Not on file    Attends meetings of clubs or organizations: Not on file    Relationship status: Not on file  . Intimate partner violence:    Fear of current or ex partner: Not on file    Emotionally abused: Not on file    Physically abused: Not on file    Forced sexual activity: Not on file  Other Topics Concern  . Not on file  Social History Narrative  . Not on file    Family History  Problem Relation Age of Onset  . Cancer Mother 11       Dec.Stage IV colon ca 10/2013--age 27  . Heart disease Father   . Heart disease Paternal Grandfather   . Other Paternal  Grandfather        Dec MVA  . Other Brother        Dec in a MVA age 33  . Alzheimer's disease Maternal Grandfather        Dec  . Other Maternal Grandfather   . Other Paternal Grandmother        Dec MVA  . Heart failure Paternal Aunt        Stage 4 colon cancer     Current Outpatient Medications:  .  omeprazole (PRILOSEC) 40 MG capsule, Take 40 mg by mouth every morning. , Disp: , Rfl:   No results found.  No images are attached to the encounter.   CMP Latest Ref Rng & Units 11/15/2018  Glucose 70 - 99 mg/dL 107(H)  BUN 6 - 20 mg/dL 14  Creatinine 0.44 - 1.00 mg/dL 0.92  Sodium 135 - 145 mmol/L 134(L)  Potassium 3.5 - 5.1 mmol/L 4.3  Chloride 98 - 111 mmol/L 101  CO2 22 - 32 mmol/L 25  Calcium 8.9 - 10.3 mg/dL 8.7(L)   CBC Latest Ref Rng & Units 11/15/2018  WBC 4.0 - 10.5 K/uL 8.9  Hemoglobin 12.0 - 15.0 g/dL 11.6(L)  Hematocrit 36.0 - 46.0 % 36.3  Platelets 150 - 400 K/uL 222     Observation/objective:appears in no acute distress over video visit today. Breathing is non labored.  Assessment and plan:patient is a 48 yr old female with iron deficiency anemia  1. Patient is 5 months s/p hysterectomy. She is not anemic today. Hb 13.6. but ferritin is down to 19. I would have expected her iron stores to have normalized 5 months post hysterectomy. Her hemorrhoidal bleeding is probably playing a part too. Discussed oral versus IV iron. She wants to go with oral iron at this time and recheck in 3 months. She will discuss her hemorrhoids with GI  Follow-up instructions:cbc ferritin and iron studies in 3 and 6  months. See md in 6 months  I discussed the assessment and treatment plan with the patient. The patient was provided an opportunity to ask questions and all were answered. The patient agreed with the plan and demonstrated an understanding of the instructions.   The patient was advised to call back or seek an in-person evaluation if the symptoms worsen or if the condition  fails to improve as anticipated.    Visit Diagnosis: No diagnosis found.  Dr. Randa Evens, MD, MPH [facility] [phone] 03/30/2019 10:27 AM

## 2019-03-30 NOTE — Progress Notes (Signed)
Pt states she feels good, of course she has the hemorrhoids that bleed.  She states that Dr. Janese Banks told her that she has low ferritin and needs to get iro n treatment. She is calling the surgeon to see about banding the hemorrhoids again.

## 2019-05-08 ENCOUNTER — Other Ambulatory Visit: Payer: Self-pay | Admitting: *Deleted

## 2019-05-08 DIAGNOSIS — Z20822 Contact with and (suspected) exposure to covid-19: Secondary | ICD-10-CM

## 2019-05-12 LAB — NOVEL CORONAVIRUS, NAA: SARS-CoV-2, NAA: DETECTED — AB

## 2019-05-23 ENCOUNTER — Telehealth: Payer: Self-pay | Admitting: Obstetrics and Gynecology

## 2019-05-23 NOTE — Telephone Encounter (Signed)
Patient tested positive for covid-19 and has been out of quarantine since the 22nd with no symptoms. Should she cancel her aex for Thursday?

## 2019-05-23 NOTE — Telephone Encounter (Signed)
Spoke with patient. Positive Covid19 test 05/08/19. Reports symptoms have resolved. Patient denies any GYN concerns or complaints. Recommended rescheduling AEX to later date, patient agreeable. AEX rescheduled to 10/1 at 2pm with Dr. Quincy Simmonds.   Routing to provider for final review. Patient is agreeable to disposition. Will close encounter.

## 2019-05-25 ENCOUNTER — Ambulatory Visit: Payer: Managed Care, Other (non HMO) | Admitting: Obstetrics and Gynecology

## 2019-07-10 ENCOUNTER — Other Ambulatory Visit: Payer: Self-pay | Admitting: Obstetrics and Gynecology

## 2019-07-10 DIAGNOSIS — Z1231 Encounter for screening mammogram for malignant neoplasm of breast: Secondary | ICD-10-CM

## 2019-07-17 DIAGNOSIS — Z86018 Personal history of other benign neoplasm: Secondary | ICD-10-CM

## 2019-07-17 HISTORY — DX: Personal history of other benign neoplasm: Z86.018

## 2019-07-27 ENCOUNTER — Ambulatory Visit: Payer: Managed Care, Other (non HMO) | Admitting: Obstetrics and Gynecology

## 2019-08-24 ENCOUNTER — Ambulatory Visit
Admission: RE | Admit: 2019-08-24 | Discharge: 2019-08-24 | Disposition: A | Discharge status: Home / Self Care | Payer: Managed Care, Other (non HMO) | Source: Ambulatory Visit | Attending: Obstetrics and Gynecology | Admitting: Obstetrics and Gynecology

## 2019-08-24 ENCOUNTER — Other Ambulatory Visit: Payer: Self-pay

## 2019-08-24 DIAGNOSIS — Z1231 Encounter for screening mammogram for malignant neoplasm of breast: Secondary | ICD-10-CM

## 2019-08-27 DIAGNOSIS — K5792 Diverticulitis of intestine, part unspecified, without perforation or abscess without bleeding: Secondary | ICD-10-CM

## 2019-08-27 HISTORY — DX: Diverticulitis of intestine, part unspecified, without perforation or abscess without bleeding: K57.92

## 2019-09-11 ENCOUNTER — Inpatient Hospital Stay (HOSPITAL_COMMUNITY)
Admission: EM | Admit: 2019-09-11 | Discharge: 2019-09-13 | DRG: 392 | Disposition: A | Discharge status: Home / Self Care | Payer: Managed Care, Other (non HMO) | Attending: Physician Assistant | Admitting: Physician Assistant

## 2019-09-11 ENCOUNTER — Other Ambulatory Visit: Payer: Self-pay

## 2019-09-11 ENCOUNTER — Encounter (HOSPITAL_COMMUNITY): Payer: Self-pay

## 2019-09-11 ENCOUNTER — Emergency Department (HOSPITAL_COMMUNITY): Payer: Managed Care, Other (non HMO)

## 2019-09-11 DIAGNOSIS — Z8249 Family history of ischemic heart disease and other diseases of the circulatory system: Secondary | ICD-10-CM | POA: Diagnosis not present

## 2019-09-11 DIAGNOSIS — Z86010 Personal history of colonic polyps: Secondary | ICD-10-CM | POA: Diagnosis not present

## 2019-09-11 DIAGNOSIS — N809 Endometriosis, unspecified: Secondary | ICD-10-CM | POA: Diagnosis present

## 2019-09-11 DIAGNOSIS — F17210 Nicotine dependence, cigarettes, uncomplicated: Secondary | ICD-10-CM | POA: Diagnosis present

## 2019-09-11 DIAGNOSIS — Z800 Family history of malignant neoplasm of digestive organs: Secondary | ICD-10-CM

## 2019-09-11 DIAGNOSIS — Z79899 Other long term (current) drug therapy: Secondary | ICD-10-CM | POA: Diagnosis not present

## 2019-09-11 DIAGNOSIS — G2581 Restless legs syndrome: Secondary | ICD-10-CM | POA: Diagnosis present

## 2019-09-11 DIAGNOSIS — K5732 Diverticulitis of large intestine without perforation or abscess without bleeding: Principal | ICD-10-CM | POA: Diagnosis present

## 2019-09-11 DIAGNOSIS — K219 Gastro-esophageal reflux disease without esophagitis: Secondary | ICD-10-CM | POA: Diagnosis present

## 2019-09-11 DIAGNOSIS — I10 Essential (primary) hypertension: Secondary | ICD-10-CM | POA: Diagnosis present

## 2019-09-11 DIAGNOSIS — E785 Hyperlipidemia, unspecified: Secondary | ICD-10-CM | POA: Diagnosis present

## 2019-09-11 DIAGNOSIS — F329 Major depressive disorder, single episode, unspecified: Secondary | ICD-10-CM | POA: Diagnosis present

## 2019-09-11 DIAGNOSIS — F419 Anxiety disorder, unspecified: Secondary | ICD-10-CM | POA: Diagnosis present

## 2019-09-11 DIAGNOSIS — K5792 Diverticulitis of intestine, part unspecified, without perforation or abscess without bleeding: Secondary | ICD-10-CM | POA: Diagnosis present

## 2019-09-11 DIAGNOSIS — Z20828 Contact with and (suspected) exposure to other viral communicable diseases: Secondary | ICD-10-CM | POA: Diagnosis present

## 2019-09-11 DIAGNOSIS — D509 Iron deficiency anemia, unspecified: Secondary | ICD-10-CM | POA: Diagnosis present

## 2019-09-11 LAB — CBC
HCT: 37.8 % (ref 36.0–46.0)
Hemoglobin: 12.1 g/dL (ref 12.0–15.0)
MCH: 27.4 pg (ref 26.0–34.0)
MCHC: 32 g/dL (ref 30.0–36.0)
MCV: 85.5 fL (ref 80.0–100.0)
Platelets: 310 10*3/uL (ref 150–400)
RBC: 4.42 MIL/uL (ref 3.87–5.11)
RDW: 13.5 % (ref 11.5–15.5)
WBC: 10.9 10*3/uL — ABNORMAL HIGH (ref 4.0–10.5)
nRBC: 0 % (ref 0.0–0.2)

## 2019-09-11 LAB — COMPREHENSIVE METABOLIC PANEL
ALT: 22 U/L (ref 0–44)
AST: 19 U/L (ref 15–41)
Albumin: 4.1 g/dL (ref 3.5–5.0)
Alkaline Phosphatase: 90 U/L (ref 38–126)
Anion gap: 13 (ref 5–15)
BUN: 11 mg/dL (ref 6–20)
CO2: 24 mmol/L (ref 22–32)
Calcium: 9.5 mg/dL (ref 8.9–10.3)
Chloride: 101 mmol/L (ref 98–111)
Creatinine, Ser: 0.99 mg/dL (ref 0.44–1.00)
GFR calc Af Amer: >60 mL/min (ref >60)
GFR calc non Af Amer: >60 mL/min (ref >60)
Glucose, Bld: 107 mg/dL — ABNORMAL HIGH (ref 70–99)
Potassium: 3.7 mmol/L (ref 3.5–5.1)
Sodium: 138 mmol/L (ref 135–145)
Total Bilirubin: 0.7 mg/dL (ref 0.3–1.2)
Total Protein: 7.7 g/dL (ref 6.5–8.1)

## 2019-09-11 LAB — URINALYSIS, ROUTINE W REFLEX MICROSCOPIC
Bilirubin Urine: NEGATIVE
Glucose, UA: NEGATIVE mg/dL
Hgb urine dipstick: NEGATIVE
Ketones, ur: NEGATIVE mg/dL
Leukocytes,Ua: NEGATIVE
Nitrite: NEGATIVE
Protein, ur: NEGATIVE mg/dL
Specific Gravity, Urine: 1.011 (ref 1.005–1.030)
pH: 6 (ref 5.0–8.0)

## 2019-09-11 LAB — I-STAT BETA HCG BLOOD, ED (MC, WL, AP ONLY): I-stat hCG, quantitative: <5 m[IU]/mL (ref <5)

## 2019-09-11 LAB — SARS CORONAVIRUS 2 (TAT 6-24 HRS): SARS Coronavirus 2: NEGATIVE

## 2019-09-11 LAB — LIPASE, BLOOD: Lipase: 28 U/L (ref 11–51)

## 2019-09-11 MED ORDER — ONDANSETRON HCL 4 MG/2ML IJ SOLN
4.0000 mg | Freq: Once | INTRAMUSCULAR | Status: AC
  Administered 2019-09-11: 4 mg via INTRAVENOUS
  Filled 2019-09-11: qty 2

## 2019-09-11 MED ORDER — METRONIDAZOLE IN NACL 5-0.79 MG/ML-% IV SOLN
500.0000 mg | Freq: Once | INTRAVENOUS | Status: AC
  Administered 2019-09-11: 500 mg via INTRAVENOUS
  Filled 2019-09-11: qty 100

## 2019-09-11 MED ORDER — POTASSIUM CHLORIDE IN NACL 20-0.9 MEQ/L-% IV SOLN
INTRAVENOUS | Status: DC
  Administered 2019-09-11: 21:00:00 via INTRAVENOUS
  Filled 2019-09-11 (×3): qty 1000

## 2019-09-11 MED ORDER — ONDANSETRON HCL 4 MG/2ML IJ SOLN
4.0000 mg | Freq: Four times a day (QID) | INTRAMUSCULAR | Status: DC | PRN
  Administered 2019-09-12: 4 mg via INTRAVENOUS
  Filled 2019-09-11: qty 2

## 2019-09-11 MED ORDER — PIPERACILLIN-TAZOBACTAM 3.375 G IVPB
3.3750 g | Freq: Three times a day (TID) | INTRAVENOUS | Status: DC
  Administered 2019-09-11 – 2019-09-13 (×6): 3.375 g via INTRAVENOUS
  Filled 2019-09-11 (×7): qty 50

## 2019-09-11 MED ORDER — DIPHENHYDRAMINE HCL 50 MG/ML IJ SOLN: 25.0000 mg | Freq: Four times a day (QID) | INTRAMUSCULAR | Status: DC | PRN

## 2019-09-11 MED ORDER — IOHEXOL 300 MG/ML  SOLN
100.0000 mL | Freq: Once | INTRAMUSCULAR | Status: AC | PRN
  Administered 2019-09-11: 100 mL via INTRAVENOUS

## 2019-09-11 MED ORDER — MORPHINE SULFATE (PF) 2 MG/ML IV SOLN
1.0000 mg | INTRAVENOUS | Status: DC | PRN
  Administered 2019-09-11 (×2): 4 mg via INTRAVENOUS
  Administered 2019-09-12 (×3): 2 mg via INTRAVENOUS
  Filled 2019-09-11: qty 2
  Filled 2019-09-11 (×4): qty 1
  Filled 2019-09-11: qty 2

## 2019-09-11 MED ORDER — SODIUM CHLORIDE 0.9 % IV SOLN
2.0000 g | Freq: Once | INTRAVENOUS | Status: AC
  Administered 2019-09-11: 2 g via INTRAVENOUS
  Filled 2019-09-11: qty 20

## 2019-09-11 MED ORDER — ENOXAPARIN SODIUM 40 MG/0.4ML ~~LOC~~ SOLN
40.0000 mg | SUBCUTANEOUS | Status: DC
  Administered 2019-09-11 – 2019-09-12 (×2): 40 mg via SUBCUTANEOUS
  Filled 2019-09-11 (×2): qty 0.4

## 2019-09-11 MED ORDER — MORPHINE SULFATE (PF) 4 MG/ML IV SOLN
4.0000 mg | Freq: Once | INTRAVENOUS | Status: AC
  Administered 2019-09-11: 4 mg via INTRAVENOUS
  Filled 2019-09-11: qty 1

## 2019-09-11 MED ORDER — DIPHENHYDRAMINE HCL 25 MG PO CAPS: 25.0000 mg | ORAL_CAPSULE | Freq: Four times a day (QID) | ORAL | Status: DC | PRN

## 2019-09-11 MED ORDER — ONDANSETRON 4 MG PO TBDP
4.0000 mg | ORAL_TABLET | Freq: Four times a day (QID) | ORAL | Status: DC | PRN
  Administered 2019-09-11: 4 mg via ORAL
  Filled 2019-09-11: qty 1

## 2019-09-11 NOTE — H&P (Signed)
Andrea [name] is an 48 y.o. female.   Chief Complaint: RLQ abdominal pain HPI: This is a 48 year old female who presents with right lower quadrant abdominal pain.  The pain started yesterday.  She had some mild nausea.  She has had similar discomfort in her abdomen in the past.  She has known diverticuli throughout her entire colon but has no history of diverticulitis per a CT scan.  She has a strong family history of colon cancer and gets colonoscopies frequently.  She denies fevers or chills.  The pain is described as moderate in intensity and sharp.  It does not refer anywhere else.  She underwent a CT scan of the abdomen pelvis showing diverticuli throughout the colon including the cecum and ascending colon.  She had a mildly dilated retrocecal appendix.  Radiology feels this is more consistent with diverticulitis than appendicitis.  She had a normal bowel movement earlier today.  Past Medical History:  Diagnosis Date  . Abnormal Pap smear of cervix    2010-2014 abnormal pap every 3-6 months   dysplastic squamous cells of cervix   . Anxiety    occ.- no meds  . Blood in stool   . Depression    no meds  . DUB (dysfunctional uterine bleeding)   . Dyspnea    Resolved - due to anemia - no current problems  . Eczema   . Endometriosis   . Epigastric pain   . Fatigue   . Fibroid   . GERD (gastroesophageal reflux disease)   . Grade I diastolic dysfunction 0000000   Noted on ECHO  . Hemorrhoid    2nd and 3rd degree  . History of adenomatous polyp of colon   . History of palpitations   . HSV infection   . Hyperlipidemia    diet controlled - no meds  . Hypertension    not currently taking medication  . Infertility, female   . Iron deficiency anemia    Last iron infusion 09/14/2018  . Left groin hernia    history of  . Muscle cramping    with exertion  . Recurrent bacterial infection    Vaginal  . Restless leg    due to anemia  . Smoker   . SVD (spontaneous vaginal  delivery)    x 2  . Thoracic back pain    while trying to sleep  . Tinnitus, left    hears heart beat, MRI normal  . Vertigo     Past Surgical History:  Procedure Laterality Date  . ABLATION ON ENDOMETRIOSIS  1995  . CERVICAL BIOPSY  W/ LOOP ELECTRODE EXCISION    . CERVICAL CONIZATION W/BX N/A 09/20/2018   Procedure: CONIZATION CERVIX WITH BIOPSY;  Surgeon: Nunzio Cobbs, MD;  Location: [facility];  Service: Gynecology;  Laterality: N/A;  . COLONOSCOPY  06/2018   x 4  . COLPOSCOPY     x 3  . CYSTOSCOPY N/A 11/14/2018   Procedure: CYSTOSCOPY;  Surgeon: Nunzio Cobbs, MD;  Location: [facility];  Service: Gynecology;  Laterality: N/A;  . DILATION AND CURETTAGE OF UTERUS  1993  . DILATION AND CURETTAGE OF UTERUS N/A 09/20/2018   Procedure: FRACTIONAL DILATATION AND CURETTAGE;  Surgeon: Nunzio Cobbs, MD;  Location: [facility];  Service: Gynecology;  Laterality: N/A;  . GYNECOLOGIC CRYOSURGERY    . HEMORRHOID BANDING     several  . HERNIA REPAIR Left    childhood  .  INTRAUTERINE DEVICE (IUD) INSERTION  2013    (Mirena)  . INTRAUTERINE DEVICE INSERTION  2000   [facility]  . IUD REMOVAL  2010  . IUD REMOVAL  2018  . TOTAL LAPAROSCOPIC HYSTERECTOMY WITH SALPINGECTOMY Bilateral 11/14/2018   Procedure: TOTAL LAPAROSCOPIC HYSTERECTOMY WITH BILATERAL SALPINGECTOMY AND LYSIS OF ADHESIONS;  Surgeon: Nunzio Cobbs, MD;  Location: [facility];  Service: Gynecology;  Laterality: Bilateral;  . TUBAL LIGATION     2010  . UPPER GI ENDOSCOPY  06/2018   x 2  . WISDOM TOOTH EXTRACTION      Family History  Problem Relation Age of Onset  . Cancer Mother 67       Dec.Stage IV colon ca 10/2013--age 50  . Heart disease Father   . Heart disease Paternal Grandfather   . Other Paternal Grandfather        Dec MVA  . Other Brother        Dec in a MVA age 63  . Alzheimer's disease Maternal Grandfather        Dec  . Other Maternal  Grandfather   . Other Paternal Grandmother        Dec MVA  . Heart failure Paternal Aunt        Stage 4 colon cancer   Social History:  reports that she has been smoking cigarettes. She has a 26.25 pack-year smoking history. She has never used smokeless tobacco. She reports current alcohol use. She reports that she does not use drugs.  Allergies: No Known Allergies  (Not in a hospital admission)   Results for orders placed or performed during the hospital encounter of 09/11/19 (from the past 48 hour(s))  Lipase, blood     Status: None   Collection Time: 09/11/19 10:52 AM  Result Value Ref Range   Lipase 28 11 - 51 U/L    Comment: Performed at [facility], [address] [place], [place] [address]  Comprehensive metabolic panel     Status: Abnormal   Collection Time: 09/11/19 10:52 AM  Result Value Ref Range   Sodium 138 135 - 145 mmol/L   Potassium 3.7 3.5 - 5.1 mmol/L   Chloride 101 98 - 111 mmol/L   CO2 24 22 - 32 mmol/L   Glucose, Bld 107 (H) 70 - 99 mg/dL   BUN 11 6 - 20 mg/dL   Creatinine, Ser 0.99 0.44 - 1.00 mg/dL   Calcium 9.5 8.9 - 10.3 mg/dL   Total Protein 7.7 6.5 - 8.1 g/dL   Albumin 4.1 3.5 - 5.0 g/dL   AST 19 15 - 41 U/L   ALT 22 0 - 44 U/L   Alkaline Phosphatase 90 38 - 126 U/L   Total Bilirubin 0.7 0.3 - 1.2 mg/dL   GFR calc non Af Amer >60 >60 mL/min   GFR calc Af Amer >60 >60 mL/min   Anion gap 13 5 - 15    Comment: Performed at [facility], [place] [address], [place] [address]  CBC     Status: Abnormal   Collection Time: 09/11/19 10:52 AM  Result Value Ref Range   WBC 10.9 (H) 4.0 - 10.5 K/uL   RBC 4.42 3.87 - 5.11 MIL/uL   Hemoglobin 12.1 12.0 - 15.0 g/dL   HCT 37.8 36.0 - 46.0 %   MCV 85.5 80.0 - 100.0 fL   MCH 27.4 26.0 - 34.0 pg   MCHC 32.0 30.0 - 36.0 g/dL  RDW 13.5 11.5 - 15.5 %   Platelets 310 150 - 400 K/uL   nRBC 0.0 0.0 - 0.2 %    Comment: Performed at [facility], [place] [address] [place], [place] [address]   Urinalysis, Routine w reflex microscopic     Status: None   Collection Time: 09/11/19 11:20 AM  Result Value Ref Range   Color, Urine YELLOW YELLOW   APPearance CLEAR CLEAR   Specific Gravity, Urine 1.011 1.005 - 1.030   pH 6.0 5.0 - 8.0   Glucose, UA NEGATIVE NEGATIVE mg/dL   Hgb urine dipstick NEGATIVE NEGATIVE   Bilirubin Urine NEGATIVE NEGATIVE   Ketones, ur NEGATIVE NEGATIVE mg/dL   Protein, ur NEGATIVE NEGATIVE mg/dL   Nitrite NEGATIVE NEGATIVE   Leukocytes,Ua NEGATIVE NEGATIVE    Comment: Performed at [facility] [address], [place], [place] [address]  I-Stat beta hCG blood, ED     Status: None   Collection Time: 09/11/19 11:29 AM  Result Value Ref Range   I-stat hCG, quantitative <5.0 <5 mIU/mL   Comment 3            Comment:   GEST. AGE      CONC.  (mIU/mL)   <=1 WEEK        5 - 50     2 WEEKS       50 - 500     3 WEEKS       100 - 10,000     4 WEEKS     1,000 - 30,000        FEMALE AND NON-PREGNANT FEMALE:     LESS THAN 5 mIU/mL    Ct Abdomen Pelvis W Contrast  Result Date: 09/11/2019 CLINICAL DATA:  Right lower quadrant pain beginning yesterday. Fever. EXAM: CT ABDOMEN AND PELVIS WITH CONTRAST TECHNIQUE: Multidetector CT imaging of the abdomen and pelvis was performed using the standard protocol following bolus administration of intravenous contrast. CONTRAST:  123mL OMNIPAQUE IOHEXOL 300 MG/ML  SOLN COMPARISON:  None. FINDINGS: Lower chest: The lung bases are clear without focal nodule, mass, or airspace disease. Heart size is normal. No significant pleural or pericardial effusion is present. Hepatobiliary: No focal liver abnormality is seen. No gallstones, gallbladder wall thickening, or biliary dilatation. Pancreas: Unremarkable. No pancreatic ductal dilatation or surrounding inflammatory changes. Spleen: Normal in size without focal abnormality. Adrenals/Urinary Tract: Bilateral adrenal adenomas are present. Right-sided lesion measures 15 mm. Left-sided  lesion measures 13 mm. Kidneys and ureters are within normal limits. No stone or mass lesion is present. Stomach/Bowel: The stomach and small bowel are within normal limits proximally. There are some inflammatory changes about the distal small bowel, likely secondary. The appendix is retrocecal. It is enlarged proximally, measuring up to 9 mm. There is distal gas. No stone is present. Phlegm a tori changes about the cecum appear to be mostly anterior. Extensive diverticular changes are present. There is no free air or abscess. Diverticular changes are present throughout the transverse colon, descending colon, and sigmoid colon without other focal inflammatory change. Vascular/Lymphatic: Atherosclerotic calcifications are present in the aorta and branch vessels. There is no aneurysm. No significant adenopathy is present. Reproductive: Status post hysterectomy. No adnexal masses. Other: No abdominal wall hernia or abnormality. No abdominopelvic ascites. Musculoskeletal: No acute or significant osseous findings. IMPRESSION: 1. Inflammatory changes centered about diverticula in the cecum and ascending colon most consistent with acute diverticulitis. No evidence for rupture or abscess. 2. Enlarged retrocecal appendix measuring  up to 9 mm with distal gas. No stone is present. This does not appear to be the source of infection. 3. Extensive colonic diverticulosis without diverticulitis elsewhere. 4. Bilateral adrenal adenomas. 5. Aortic Atherosclerosis (ICD10-I70.0). Electronically Signed   By: San Morelle M.D.   On: 09/11/2019 13:34    Review of Systems  Constitutional: Negative for chills and fever.  Respiratory: Negative for cough and shortness of breath.   Cardiovascular: Negative for chest pain.  Gastrointestinal: Positive for abdominal pain and nausea. Negative for constipation, diarrhea and vomiting.  Genitourinary: Negative for dysuria.    Blood pressure (!) 148/91, pulse 99, temperature 98.5 F  (36.9 C), temperature source Oral, resp. rate 15, height 5\' 7"  (1.702 m), weight 106.6 kg, SpO2 100 %. Physical Exam  Constitutional: She is oriented to person, place, and time. She appears well-developed and well-nourished. No distress.  HENT:  Head: Normocephalic and atraumatic.  Right Ear: External ear normal.  Left Ear: External ear normal.  Nose: Nose normal.  Mouth/Throat: Oropharynx is clear and moist. No oropharyngeal exudate.  Eyes: Pupils are equal, round, and reactive to light. Right eye exhibits no discharge. Left eye exhibits no discharge. No scleral icterus.  Neck: Normal range of motion. Neck supple. No tracheal deviation present.  Cardiovascular: Normal rate, regular rhythm, normal heart sounds and intact distal pulses.  No murmur heard. Respiratory: Effort normal and breath sounds normal. No respiratory distress.  GI: Soft. There is abdominal tenderness. There is guarding.  Abdomen is obese.  There is tenderness with guarding in the right lower quadrant  Musculoskeletal: Normal range of motion.        General: No deformity or edema.  Neurological: She is alert and oriented to person, place, and time.  Skin: Skin is dry. She is not diaphoretic. No erythema.  Psychiatric: Her behavior is normal. Judgment normal.     Assessment/Plan Diverticulitis of the colon  I have reviewed the CT scan and agree with the radiologist that this appears more consistent with diverticulitis of her cecum and ascending colon.  She has multiple diverticuli throughout her entire colon.  The appendix is retrocecal and does not appear to be inflamed and there is no appendicolith. I recommend admission for IV antibiotics and bowel rest.  Hopefully this will improve without the need for surgical intervention.  Should she acutely worsen, we will proceed to the operating room for a diagnostic laparoscopy.  She is in agreement with the plan.  Coralie Keens, MD 09/11/2019, 2:28 PM

## 2019-09-11 NOTE — ED Provider Notes (Signed)
[facility] Provider Note   CSN: [id number] Arrival date & time: 09/11/19  1031     History   Chief Complaint Chief Complaint  Patient presents with  . Abdominal Pain    HPI Andrea [name] is a 48 y.o. female has no history of blood in stool, depression, dyspnea, endometriosis who presents for evaluation of right lower quadrant abdominal pain that began yesterday.  She reports that about 2 PM yesterday, she noticed a dull pain in the right lower quadrant of her abdomen.  Since then, pain has become progressively worse.  She states it is worse in the right lower quadrant but radiates up into the right upper and middle of her abdomen.  She now states it is more of a sharp pain.  She states she has not had any nausea/vomiting/diarrhea.  She states at home, she noted fever of 100.8.  She did take Advil prior to coming to the ED.  She called her PCP who advised her to come to the emergency department.  She states she still has been able to eat though she has not had much.  She has a history of blood in stools and states that this is longstanding.  She has been followed by GI and determined that this was caused by hemorrhoids.  She states her last bowel movement was yesterday and did have some bright red blood that she states is consistent with her history.  She denies any chest pain, difficulty breathing, dysuria, hematuria.     The history is provided by the patient.    Past Medical History:  Diagnosis Date  . Abnormal Pap smear of cervix    2010-2014 abnormal pap every 3-6 months   dysplastic squamous cells of cervix   . Anxiety    occ.- no meds  . Blood in stool   . Depression    no meds  . DUB (dysfunctional uterine bleeding)   . Dyspnea    Resolved - due to anemia - no current problems  . Eczema   . Endometriosis   . Epigastric pain   . Fatigue   . Fibroid   . GERD (gastroesophageal reflux disease)   . Grade I diastolic  dysfunction 0000000   Noted on ECHO  . Hemorrhoid    2nd and 3rd degree  . History of adenomatous polyp of colon   . History of palpitations   . HSV infection   . Hyperlipidemia    diet controlled - no meds  . Hypertension    not currently taking medication  . Infertility, female   . Iron deficiency anemia    Last iron infusion 09/14/2018  . Left groin hernia    history of  . Muscle cramping    with exertion  . Recurrent bacterial infection    Vaginal  . Restless leg    due to anemia  . Smoker   . SVD (spontaneous vaginal delivery)    x 2  . Thoracic back pain    while trying to sleep  . Tinnitus, left    hears heart beat, MRI normal  . Vertigo     Patient Active Problem List   Diagnosis Date Noted  . Diverticulitis 09/11/2019  . Iron deficiency anemia 09/12/2018  . Pelvic pain 08/25/2018  . Dyspareunia in female 08/25/2018  . Vertigo 09/15/2017    Past Surgical History:  Procedure Laterality Date  . ABLATION ON ENDOMETRIOSIS  1995  . CERVICAL BIOPSY  W/ LOOP  ELECTRODE EXCISION    . CERVICAL CONIZATION W/BX N/A 09/20/2018   Procedure: CONIZATION CERVIX WITH BIOPSY;  Surgeon: Nunzio Cobbs, MD;  Location: [facility];  Service: Gynecology;  Laterality: N/A;  . COLONOSCOPY  06/2018   x 4  . COLPOSCOPY     x 3  . CYSTOSCOPY N/A 11/14/2018   Procedure: CYSTOSCOPY;  Surgeon: Nunzio Cobbs, MD;  Location: [facility];  Service: Gynecology;  Laterality: N/A;  . DILATION AND CURETTAGE OF UTERUS  1993  . DILATION AND CURETTAGE OF UTERUS N/A 09/20/2018   Procedure: FRACTIONAL DILATATION AND CURETTAGE;  Surgeon: Nunzio Cobbs, MD;  Location: [facility];  Service: Gynecology;  Laterality: N/A;  . GYNECOLOGIC CRYOSURGERY    . HEMORRHOID BANDING     several  . HERNIA REPAIR Left    childhood  . INTRAUTERINE DEVICE (IUD) INSERTION  2013    (Mirena)  . INTRAUTERINE DEVICE INSERTION  2000   [place]  . IUD  REMOVAL  2010  . IUD REMOVAL  2018  . TOTAL LAPAROSCOPIC HYSTERECTOMY WITH SALPINGECTOMY Bilateral 11/14/2018   Procedure: TOTAL LAPAROSCOPIC HYSTERECTOMY WITH BILATERAL SALPINGECTOMY AND LYSIS OF ADHESIONS;  Surgeon: Nunzio Cobbs, MD;  Location: [facility];  Service: Gynecology;  Laterality: Bilateral;  . TUBAL LIGATION     2010  . UPPER GI ENDOSCOPY  06/2018   x 2  . WISDOM TOOTH EXTRACTION       OB History    Gravida  3   Para  2   Term      Preterm      AB  1   Living  2     SAB  1   TAB      Ectopic      Multiple      Live Births               Home Medications    Prior to Admission medications   Medication Sig Start Date End Date Taking? Authorizing Provider  hydrochlorothiazide (HYDRODIURIL) 25 MG tablet Take 12.5 mg by mouth daily.   Yes [provider]  ibuprofen (ADVIL) 200 MG tablet Take 600-800 mg by mouth every 6 (six) hours as needed for moderate pain.   Yes [provider]  mesalamine (CANASA) 1000 MG suppository Place 1,000 mg rectally at bedtime. 08/23/19  Yes [provider]  omeprazole (PRILOSEC) 40 MG capsule Take 40 mg by mouth every morning.    Yes [provider]    Family History Family History  Problem Relation Age of Onset  . Cancer Mother 17       Dec.Stage IV colon ca 10/2013--age 53  . Heart disease Father   . Heart disease Paternal Grandfather   . Other Paternal Grandfather        Dec MVA  . Other Brother        Dec in a MVA age 58  . Alzheimer's disease Maternal Grandfather        Dec  . Other Maternal Grandfather   . Other Paternal Grandmother        Dec MVA  . Heart failure Paternal Aunt        Stage 4 colon cancer    Social History Social History   Tobacco Use  . Smoking status: Current Every Day Smoker    Packs/day: 0.75    Years: 35.00    Pack years: 26.25  Types: Cigarettes  . Smokeless tobacco: Never Used  Substance Use Topics  . Alcohol use: Yes     Comment: 6-7 drinks a week (alcohol)  Red Bull and Vodka or red wine  . Drug use: Never     Allergies   Patient has no known allergies.   Review of Systems Review of Systems  Constitutional: Negative for fever.  Respiratory: Negative for cough and shortness of breath.   Cardiovascular: Negative for chest pain.  Gastrointestinal: Positive for abdominal pain and blood in stool (chronic). Negative for nausea and vomiting.  Genitourinary: Negative for dysuria and hematuria.  Neurological: Negative for headaches.  All other systems reviewed and are negative.    Physical Exam Updated Vital Signs BP (!) 148/91 (BP Location: Right Arm)   Pulse 99   Temp 98.5 F (36.9 C) (Oral)   Resp 15   Ht 5\' 7"  (1.702 m)   Wt 106.6 kg   LMP  (LMP Unknown) Comment: LMP 06/2018  SpO2 100%   BMI 36.81 kg/m   Physical Exam Vitals signs and nursing note reviewed.  Constitutional:      Appearance: Normal appearance. She is well-developed.     Comments: Appears uncomfortable but no acute distress.  HENT:     Head: Normocephalic and atraumatic.  Eyes:     General: Lids are normal.     Conjunctiva/sclera: Conjunctivae normal.     Pupils: Pupils are equal, round, and reactive to light.  Neck:     Musculoskeletal: Full passive range of motion without pain.  Cardiovascular:     Rate and Rhythm: Regular rhythm. Tachycardia present.     Pulses: Normal pulses.     Heart sounds: Normal heart sounds. No murmur. No friction rub. No gallop.   Pulmonary:     Effort: Pulmonary effort is normal.     Breath sounds: Normal breath sounds.  Abdominal:     Palpations: Abdomen is soft. Abdomen is not rigid.     Tenderness: There is abdominal tenderness in the right upper quadrant and right lower quadrant. There is no right CVA tenderness, left CVA tenderness or guarding. Positive signs include McBurney's sign.     Comments: Abdomen is soft, nondistended.  Tenderness noted focally to the right lower quadrant  at McBurney's point.  Positive Rovsing sign.  No rigidity, guarding.  Tenderness also to right upper quadrant.  No CVA tenderness noted bilaterally.  Musculoskeletal: Normal range of motion.  Skin:    General: Skin is warm and dry.     Capillary Refill: Capillary refill takes less than 2 seconds.  Neurological:     Mental Status: She is alert and oriented to person, place, and time.  Psychiatric:        Speech: Speech normal.      ED Treatments / Results  Labs (all labs ordered are listed, but only abnormal results are displayed) Labs Reviewed  COMPREHENSIVE METABOLIC PANEL - Abnormal; Notable for the following components:      Result Value   Glucose, Bld 107 (*)    All other components within normal limits  CBC - Abnormal; Notable for the following components:   WBC 10.9 (*)    All other components within normal limits  LIPASE, BLOOD  URINALYSIS, ROUTINE W REFLEX MICROSCOPIC  I-STAT BETA HCG BLOOD, ED ([facility], [facility], [facility] ONLY)    EKG [facility] [name]  Radiology Ct Abdomen Pelvis W Contrast  Result Date: 09/11/2019 CLINICAL DATA:  Right lower quadrant pain beginning yesterday. Fever. EXAM: CT  ABDOMEN AND PELVIS WITH CONTRAST TECHNIQUE: Multidetector CT imaging of the abdomen and pelvis was performed using the standard protocol following bolus administration of intravenous contrast. CONTRAST:  128mL OMNIPAQUE IOHEXOL 300 MG/ML  SOLN COMPARISON:  [facility] [name]. FINDINGS: Lower chest: The lung bases are clear without focal nodule, mass, or airspace disease. Heart size is normal. No significant pleural or pericardial effusion is present. Hepatobiliary: No focal liver abnormality is seen. No gallstones, gallbladder wall thickening, or biliary dilatation. Pancreas: Unremarkable. No pancreatic ductal dilatation or surrounding inflammatory changes. Spleen: Normal in size without focal abnormality. Adrenals/Urinary Tract: Bilateral adrenal adenomas are present. Right-sided lesion measures 15 mm. Left-sided lesion  measures 13 mm. Kidneys and ureters are within normal limits. No stone or mass lesion is present. Stomach/Bowel: The stomach and small bowel are within normal limits proximally. There are some inflammatory changes about the distal small bowel, likely secondary. The appendix is retrocecal. It is enlarged proximally, measuring up to 9 mm. There is distal gas. No stone is present. Phlegm a tori changes about the cecum appear to be mostly anterior. Extensive diverticular changes are present. There is no free air or abscess. Diverticular changes are present throughout the transverse colon, descending colon, and sigmoid colon without other focal inflammatory change. Vascular/Lymphatic: Atherosclerotic calcifications are present in the aorta and branch vessels. There is no aneurysm. No significant adenopathy is present. Reproductive: Status post hysterectomy. No adnexal masses. Other: No abdominal wall hernia or abnormality. No abdominopelvic ascites. Musculoskeletal: No acute or significant osseous findings. IMPRESSION: 1. Inflammatory changes centered about diverticula in the cecum and ascending colon most consistent with acute diverticulitis. No evidence for rupture or abscess. 2. Enlarged retrocecal appendix measuring up to 9 mm with distal gas. No stone is present. This does not appear to be the source of infection. 3. Extensive colonic diverticulosis without diverticulitis elsewhere. 4. Bilateral adrenal adenomas. 5. Aortic Atherosclerosis (ICD10-I70.0). Electronically Signed   By: San Morelle M.D.   On: 09/11/2019 13:34    Procedures Procedures (including critical care time)  Medications Ordered in ED Medications  cefTRIAXone (ROCEPHIN) 2 g in sodium chloride 0.9 % 100 mL IVPB (has no administration in time range)    And  metroNIDAZOLE (FLAGYL) IVPB 500 mg (has no administration in time range)  ondansetron (ZOFRAN) injection 4 mg (4 mg Intravenous Given 09/11/19 1236)  morphine 4 MG/ML injection  4 mg (4 mg Intravenous Given 09/11/19 1237)  iohexol (OMNIPAQUE) 300 MG/ML solution 100 mL (100 mLs Intravenous Contrast Given 09/11/19 1301)     Initial Impression / Assessment and Plan / ED Course  I have reviewed the triage vital signs and the nursing notes.  Pertinent labs & imaging results that were available during my care of the patient were reviewed by me and considered in my medical decision making (see chart for details).        48 year old female who presents for evaluation of right lower quadrant pain that began yesterday.  No nausea/vomiting.  Associated with fevers.  Called PCP who advised her to come to emergency department today.  Initially arrival, she is afebrile, tachycardic, hypertensive.  Vitals otherwise stable.  On exam, she has tenderness into the right lower quadrant focally as well as some tenderness in the right upper quadrant.  Concern for appendicitis versus other infectious etiology versus hepatobiliary etiology.  Plan for labs.  CBC shows slight leukocytosis of 10.9.  UA negative for any infectious etiology.  CMP shows no abnormalities.  LFTs are within normal limits.  Lipase  is unremarkable.  CT scan shows inflammatory changes centered around the diverticula in the cecum and ascending colon most consistent with acute diverticulitis.  No evidence for rupture or abscess.  She has an enlarged retrocecal appendix measuring up to 9 mm with distal gas.  They do not feel that this is the source of infection.  She has extensive colonic diverticulosis without diverticulitis otherwise seen.  Discussed patient with Claiborne Billings, PA-C (Gen Surg).  She will evaluate the patient and reviewed the CT scan and discuss with Dr. Ninfa Linden.  Gen surg will plan to admit for bowel rest and IV antibiotics.  Portions of this note were generated with [employer]. Dictation errors may occur despite best attempts at proofreading.   Final Clinical Impressions(s) / ED Diagnoses    Final diagnoses:  Diverticulitis    ED Discharge Orders    [facility] [name]       Volanda Napoleon, PA-C 09/11/19 1511    Sherwood Gambler, MD 09/11/19 1555

## 2019-09-11 NOTE — ED Triage Notes (Signed)
Pt endorses RLQ pain since yesterday with fever. No n/v/d. Sent for rule out of appendicitis. Tachy and hypertensive.

## 2019-09-12 LAB — CBC
HCT: 29.9 % — ABNORMAL LOW (ref 36.0–46.0)
Hemoglobin: 9.8 g/dL — ABNORMAL LOW (ref 12.0–15.0)
MCH: 27.8 pg (ref 26.0–34.0)
MCHC: 32.8 g/dL (ref 30.0–36.0)
MCV: 84.7 fL (ref 80.0–100.0)
Platelets: 276 10*3/uL (ref 150–400)
RBC: 3.53 MIL/uL — ABNORMAL LOW (ref 3.87–5.11)
RDW: 13.7 % (ref 11.5–15.5)
WBC: 9.5 10*3/uL (ref 4.0–10.5)
nRBC: 0 % (ref 0.0–0.2)

## 2019-09-12 LAB — BASIC METABOLIC PANEL
Anion gap: 13 (ref 5–15)
BUN: 8 mg/dL (ref 6–20)
CO2: 22 mmol/L (ref 22–32)
Calcium: 8.5 mg/dL — ABNORMAL LOW (ref 8.9–10.3)
Chloride: 102 mmol/L (ref 98–111)
Creatinine, Ser: 0.96 mg/dL (ref 0.44–1.00)
GFR calc Af Amer: >60 mL/min (ref >60)
GFR calc non Af Amer: >60 mL/min (ref >60)
Glucose, Bld: 97 mg/dL (ref 70–99)
Potassium: 4 mmol/L (ref 3.5–5.1)
Sodium: 137 mmol/L (ref 135–145)

## 2019-09-12 LAB — HIV ANTIBODY (ROUTINE TESTING W REFLEX): HIV Screen 4th Generation wRfx: NONREACTIVE — AB

## 2019-09-12 MED ORDER — ACETAMINOPHEN 325 MG PO TABS
650.0000 mg | ORAL_TABLET | Freq: Four times a day (QID) | ORAL | Status: DC | PRN
  Administered 2019-09-12 (×2): 650 mg via ORAL
  Filled 2019-09-12 (×3): qty 2

## 2019-09-12 NOTE — Progress Notes (Signed)
[facility] Progress Note     Subjective: CC: diverticulitis  Patient reports abdominal pain somewhat better this AM. Denies nausea. +flatus. Had some headache this AM, improving with tylenol.   Objective: Vital signs in last 24 hours: Temp:  [98.5 F (36.9 C)-99.7 F (37.6 C)] 99.7 F (37.6 C) (11/17 0455) Pulse Rate:  [84-109] 86 (11/17 0455) Resp:  [15-20] 16 (11/17 0455) BP: (114-167)/(60-91) 134/75 (11/17 0455) SpO2:  [93 %-100 %] 95 % (11/17 0455) Weight:  [106.6 kg] 106.6 kg (11/16 1216)    Intake/Output from previous day: 11/16 0701 - 11/17 0700 In: 1032.3 [I.V.:708.1; IV Piggyback:324.2] Out: -  Intake/Output this shift: No intake/output data recorded.  PE: Gen:  Alert, NAD, pleasant Card:  Regular rate and rhythm Pulm:  Normal effort, clear to auscultation bilaterally Abd: Soft, ttp in RLQ, no peritonitis, non-distended, +BS Skin: warm and dry, no rashes  Psych: A&Ox3   Lab Results:  Recent Labs    09/11/19 1052 09/12/19 0500  WBC 10.9* 9.5  HGB 12.1 9.8*  HCT 37.8 29.9*  PLT 310 276   BMET Recent Labs    09/11/19 1052 09/12/19 0500  NA 138 137  K 3.7 4.0  CL 101 102  CO2 24 22  GLUCOSE 107* 97  BUN 11 8  CREATININE 0.99 0.96  CALCIUM 9.5 8.5*   PT/INR No results for input(s): LABPROT, INR in the last 72 hours. CMP     Component Value Date/Time   NA 137 09/12/2019 0500   K 4.0 09/12/2019 0500   CL 102 09/12/2019 0500   CO2 22 09/12/2019 0500   GLUCOSE 97 09/12/2019 0500   BUN 8 09/12/2019 0500   CREATININE 0.96 09/12/2019 0500   CALCIUM 8.5 (L) 09/12/2019 0500   PROT 7.7 09/11/2019 1052   ALBUMIN 4.1 09/11/2019 1052   AST 19 09/11/2019 1052   ALT 22 09/11/2019 1052   ALKPHOS 90 09/11/2019 1052   BILITOT 0.7 09/11/2019 1052   GFRNONAA >60 09/12/2019 0500   GFRAA >60 09/12/2019 0500   Lipase     Component Value Date/Time   LIPASE 28 09/11/2019 1052       Studies/Results: Ct Abdomen Pelvis W  Contrast  Result Date: 09/11/2019 CLINICAL DATA:  Right lower quadrant pain beginning yesterday. Fever. EXAM: CT ABDOMEN AND PELVIS WITH CONTRAST TECHNIQUE: Multidetector CT imaging of the abdomen and pelvis was performed using the standard protocol following bolus administration of intravenous contrast. CONTRAST:  175mL OMNIPAQUE IOHEXOL 300 MG/ML  SOLN COMPARISON:  None. FINDINGS: Lower chest: The lung bases are clear without focal nodule, mass, or airspace disease. Heart size is normal. No significant pleural or pericardial effusion is present. Hepatobiliary: No focal liver abnormality is seen. No gallstones, gallbladder wall thickening, or biliary dilatation. Pancreas: Unremarkable. No pancreatic ductal dilatation or surrounding inflammatory changes. Spleen: Normal in size without focal abnormality. Adrenals/Urinary Tract: Bilateral adrenal adenomas are present. Right-sided lesion measures 15 mm. Left-sided lesion measures 13 mm. Kidneys and ureters are within normal limits. No stone or mass lesion is present. Stomach/Bowel: The stomach and small bowel are within normal limits proximally. There are some inflammatory changes about the distal small bowel, likely secondary. The appendix is retrocecal. It is enlarged proximally, measuring up to 9 mm. There is distal gas. No stone is present. Phlegm a tori changes about the cecum appear to be mostly anterior. Extensive diverticular changes are present. There is no free air or abscess. Diverticular changes are present throughout the transverse colon, descending  colon, and sigmoid colon without other focal inflammatory change. Vascular/Lymphatic: Atherosclerotic calcifications are present in the aorta and branch vessels. There is no aneurysm. No significant adenopathy is present. Reproductive: Status post hysterectomy. No adnexal masses. Other: No abdominal wall hernia or abnormality. No abdominopelvic ascites. Musculoskeletal: No acute or significant osseous  findings. IMPRESSION: 1. Inflammatory changes centered about diverticula in the cecum and ascending colon most consistent with acute diverticulitis. No evidence for rupture or abscess. 2. Enlarged retrocecal appendix measuring up to 9 mm with distal gas. No stone is present. This does not appear to be the source of infection. 3. Extensive colonic diverticulosis without diverticulitis elsewhere. 4. Bilateral adrenal adenomas. 5. Aortic Atherosclerosis (ICD10-I70.0). Electronically Signed   By: San Morelle M.D.   On: 09/11/2019 13:34    Anti-infectives: Anti-infectives (From admission, onward)   Start     Dose/Rate Route Frequency Ordered Stop   09/11/19 1530  piperacillin-tazobactam (ZOSYN) IVPB 3.375 g     3.375 g 12.5 mL/hr over 240 Minutes Intravenous Every 8 hours 09/11/19 1524     09/11/19 1445  cefTRIAXone (ROCEPHIN) 2 g in sodium chloride 0.9 % 100 mL IVPB     2 g 200 mL/hr over 30 Minutes Intravenous  Once 09/11/19 1438 09/11/19 1913   09/11/19 1445  metroNIDAZOLE (FLAGYL) IVPB 500 mg     500 mg 100 mL/hr over 60 Minutes Intravenous  Once 09/11/19 1438 09/11/19 1913       Assessment/Plan HTN Iron deficiency anemia HLD GERD Hx of Endometriosis Restless leg syndrome  Depression/anxiety  RLQ pain Diverticulitis vs appendicitis - CT 11/16: more consistent with diverticulitis of cecum and ascending colon, appendix is retrocecal and doesn't appear to be inflamed - WBC 9.5 from 10.9, afebrile - pain improving - advance to CLD  - continue IV abx  FEN: CLD, IVF VTE: SCDs, lovenox ID: rocephin/flagyl 11/16; zosyn 11/16>>  LOS: 1 day    Brigid Re , [facility] 09/12/2019, 9:46 AM Please see Amion for pager number during day hours 7:00am-4:30pm

## 2019-09-12 NOTE — Plan of Care (Signed)

## 2019-09-12 NOTE — Progress Notes (Signed)
Pt complains of headache. pgd PA for orders

## 2019-09-13 LAB — CBC
HCT: 32.1 % — ABNORMAL LOW (ref 36.0–46.0)
Hemoglobin: 10.2 g/dL — ABNORMAL LOW (ref 12.0–15.0)
MCH: 27.2 pg (ref 26.0–34.0)
MCHC: 31.8 g/dL (ref 30.0–36.0)
MCV: 85.6 fL (ref 80.0–100.0)
Platelets: 288 10*3/uL (ref 150–400)
RBC: 3.75 MIL/uL — ABNORMAL LOW (ref 3.87–5.11)
RDW: 13.3 % (ref 11.5–15.5)
WBC: 8.2 10*3/uL (ref 4.0–10.5)
nRBC: 0 % (ref 0.0–0.2)

## 2019-09-13 LAB — BASIC METABOLIC PANEL
Anion gap: 10 (ref 5–15)
BUN: 6 mg/dL (ref 6–20)
CO2: 23 mmol/L (ref 22–32)
Calcium: 8.5 mg/dL — ABNORMAL LOW (ref 8.9–10.3)
Chloride: 104 mmol/L (ref 98–111)
Creatinine, Ser: 0.87 mg/dL (ref 0.44–1.00)
GFR calc Af Amer: >60 mL/min (ref >60)
GFR calc non Af Amer: >60 mL/min (ref >60)
Glucose, Bld: 98 mg/dL (ref 70–99)
Potassium: 3.8 mmol/L (ref 3.5–5.1)
Sodium: 137 mmol/L (ref 135–145)

## 2019-09-13 MED ORDER — AMOXICILLIN-POT CLAVULANATE 875-125 MG PO TABS: 1.0000 | ORAL_TABLET | Freq: Two times a day (BID) | ORAL | 0 refills | Status: AC

## 2019-09-13 MED ORDER — ACETAMINOPHEN 325 MG PO TABS: 650.0000 mg | ORAL_TABLET | Freq: Four times a day (QID) | ORAL | Status: DC | PRN

## 2019-09-13 NOTE — Progress Notes (Signed)
Subjective/Chief Complaint: Feeling much better Almost no abdominal pain Passing flatus Tolerating liquids   Objective: Vital signs in last 24 hours: Temp:  [98.3 F (36.8 C)-99.2 F (37.3 C)] 98.3 F (36.8 C) (11/18 0609) Pulse Rate:  [78-88] 78 (11/18 0609) Resp:  [18] 18 (11/18 0609) BP: (123-141)/(74-77) 123/74 (11/18 0609) SpO2:  [94 %-99 %] 96 % (11/18 0609) Last BM Date: 09/11/19  Intake/Output from previous day: 11/17 0701 - 11/18 0700 In: 1088.2 [P.O.:200; I.V.:749.4; IV Piggyback:138.8] Out: -  Intake/Output this shift: No intake/output data recorded.  Exam: Awake and alert Abdomen soft, almost non-tender in the RLQ  Lab Results:  Recent Labs    09/12/19 0500 09/13/19 0348  WBC 9.5 8.2  HGB 9.8* 10.2*  HCT 29.9* 32.1*  PLT 276 288   BMET Recent Labs    09/12/19 0500 09/13/19 0348  NA 137 137  K 4.0 3.8  CL 102 104  CO2 22 23  GLUCOSE 97 98  BUN 8 6  CREATININE 0.96 0.87  CALCIUM 8.5* 8.5*   PT/INR No results for input(s): LABPROT, INR in the last 72 hours. ABG No results for input(s): PHART, HCO3 in the last 72 hours.  Invalid input(s): PCO2, PO2  Studies/Results: Ct Abdomen Pelvis W Contrast  Result Date: 09/11/2019 CLINICAL DATA:  Right lower quadrant pain beginning yesterday. Fever. EXAM: CT ABDOMEN AND PELVIS WITH CONTRAST TECHNIQUE: Multidetector CT imaging of the abdomen and pelvis was performed using the standard protocol following bolus administration of intravenous contrast. CONTRAST:  144mL OMNIPAQUE IOHEXOL 300 MG/ML  SOLN COMPARISON:  None. FINDINGS: Lower chest: The lung bases are clear without focal nodule, mass, or airspace disease. Heart size is normal. No significant pleural or pericardial effusion is present. Hepatobiliary: No focal liver abnormality is seen. No gallstones, gallbladder wall thickening, or biliary dilatation. Pancreas: Unremarkable. No pancreatic ductal dilatation or surrounding inflammatory changes.  Spleen: Normal in size without focal abnormality. Adrenals/Urinary Tract: Bilateral adrenal adenomas are present. Right-sided lesion measures 15 mm. Left-sided lesion measures 13 mm. Kidneys and ureters are within normal limits. No stone or mass lesion is present. Stomach/Bowel: The stomach and small bowel are within normal limits proximally. There are some inflammatory changes about the distal small bowel, likely secondary. The appendix is retrocecal. It is enlarged proximally, measuring up to 9 mm. There is distal gas. No stone is present. Phlegm a tori changes about the cecum appear to be mostly anterior. Extensive diverticular changes are present. There is no free air or abscess. Diverticular changes are present throughout the transverse colon, descending colon, and sigmoid colon without other focal inflammatory change. Vascular/Lymphatic: Atherosclerotic calcifications are present in the aorta and branch vessels. There is no aneurysm. No significant adenopathy is present. Reproductive: Status post hysterectomy. No adnexal masses. Other: No abdominal wall hernia or abnormality. No abdominopelvic ascites. Musculoskeletal: No acute or significant osseous findings. IMPRESSION: 1. Inflammatory changes centered about diverticula in the cecum and ascending colon most consistent with acute diverticulitis. No evidence for rupture or abscess. 2. Enlarged retrocecal appendix measuring up to 9 mm with distal gas. No stone is present. This does not appear to be the source of infection. 3. Extensive colonic diverticulosis without diverticulitis elsewhere. 4. Bilateral adrenal adenomas. 5. Aortic Atherosclerosis (ICD10-I70.0). Electronically Signed   By: San Morelle M.D.   On: 09/11/2019 13:34    Anti-infectives: Anti-infectives (From admission, onward)   Start     Dose/Rate Route Frequency Ordered Stop   09/11/19 1530  piperacillin-tazobactam (ZOSYN) IVPB  3.375 g     3.375 g 12.5 mL/hr over 240 Minutes  Intravenous Every 8 hours 09/11/19 1524     09/11/19 1445  cefTRIAXone (ROCEPHIN) 2 g in sodium chloride 0.9 % 100 mL IVPB     2 g 200 mL/hr over 30 Minutes Intravenous  Once 09/11/19 1438 09/11/19 1913   09/11/19 1445  metroNIDAZOLE (FLAGYL) IVPB 500 mg     500 mg 100 mL/hr over 60 Minutes Intravenous  Once 09/11/19 1438 09/11/19 1913      Assessment/Plan: Diverticulitis of cecum and ascending colon  Continue IV antibiotics WBC normal Soft diet  If tolerates a soft diet, will d/c home this afternoon on oral antibiotics  LOS: 2 days    Andrea [name] 09/13/2019

## 2019-09-13 NOTE — Discharge Instructions (Signed)

## 2019-09-13 NOTE — Discharge Summary (Signed)
[facility] Discharge Summary   Patient ID: Andrea [name] MRN: [id number] DOB/AGE: January 09, 1971 48 y.o.  Admit date: 09/11/2019 Discharge date: 09/13/2019  Admitting Diagnosis: Diverticulitis  Discharge Diagnosis Diverticulitis   Consultants None  Imaging: No results found.  Procedures None  Hospital Course:  Patient is a 48 year old female who presented to [facility] with abdominal pain.  Workup showed diverticulitis.  Patient was admitted and treated with bowel rest and IV abx. Improved with medication managment.  Diet was advanced as tolerated.  On 09/13/19, the patient was voiding well, tolerating diet, ambulating well, pain well controlled, vital signs stable and felt stable for discharge home.  Patient will follow up in our office in 3 weeks and knows to call with questions or concerns. She will call to confirm appointment date/time.     Allergies as of 09/13/2019   No Known Allergies     Medication List    STOP taking these medications   mesalamine 1000 MG suppository Commonly known as: CANASA     TAKE these medications   acetaminophen 325 MG tablet Commonly known as: TYLENOL Take 2 tablets (650 mg total) by mouth every 6 (six) hours as needed for mild pain, fever or headache.   amoxicillin-clavulanate 875-125 MG tablet Commonly known as: Augmentin Take 1 tablet by mouth every 12 (twelve) hours for 8 days.   hydrochlorothiazide 25 MG tablet Commonly known as: HYDRODIURIL Take 12.5 mg by mouth daily.   ibuprofen 200 MG tablet Commonly known as: ADVIL Take 600-800 mg by mouth every 6 (six) hours as needed for moderate pain.   omeprazole 40 MG capsule Commonly known as: PRILOSEC Take 40 mg by mouth every morning.        Follow-up Information    Coralie Keens, MD. Go on 10/06/2019.   Specialty: General Surgery Why: Follow up appointment scheduled for 10:50 AM. Please arrive 30 min prior to appointment time. Bring photo ID and  insurance information.  Contact information: [address] [place] [address] [phone]           Signed: Brigid Re, [facility] 09/13/2019, 2:27 PM Please see Amion for pager number during day hours 7:00am-4:30pm

## 2019-09-18 ENCOUNTER — Other Ambulatory Visit: Payer: Self-pay

## 2019-09-20 ENCOUNTER — Encounter: Payer: Self-pay | Admitting: Obstetrics and Gynecology

## 2019-09-20 ENCOUNTER — Ambulatory Visit (INDEPENDENT_AMBULATORY_CARE_PROVIDER_SITE_OTHER): Payer: Managed Care, Other (non HMO) | Admitting: Obstetrics and Gynecology

## 2019-09-20 ENCOUNTER — Other Ambulatory Visit: Payer: Self-pay

## 2019-09-20 ENCOUNTER — Other Ambulatory Visit (HOSPITAL_COMMUNITY)
Admission: RE | Admit: 2019-09-20 | Discharge: 2019-09-20 | Disposition: A | Discharge status: Home / Self Care | Payer: Managed Care, Other (non HMO) | Source: Ambulatory Visit | Attending: Obstetrics and Gynecology | Admitting: Obstetrics and Gynecology

## 2019-09-20 VITALS — BP 120/82 | HR 80 | Temp 97.7°F | Resp 16 | Ht 67.25 in | Wt 231.4 lb

## 2019-09-20 DIAGNOSIS — Z01419 Encounter for gynecological examination (general) (routine) without abnormal findings: Secondary | ICD-10-CM | POA: Diagnosis not present

## 2019-09-20 DIAGNOSIS — N760 Acute vaginitis: Secondary | ICD-10-CM

## 2019-09-20 MED ORDER — FLUCONAZOLE 150 MG PO TABS: 150.0000 mg | ORAL_TABLET | Freq: Once | ORAL | 0 refills | Status: AC

## 2019-09-20 NOTE — Progress Notes (Signed)
48 y.o. [id number] Married Caucasian female here for annual exam.    Some hot flashes.  Does not want tx.   Patient hospitalized last week and diagnosed with diverticulitis. She now feels like she may have yeast infection--but having more rectal itching. No more rectal bleeding.  She is on antibiotics. Feeling better.  Seeing Dr. Ninfa Linden.  Had Covid this summer.  She lost her sense of small and taste.   PCP: Jilda Panda, MD  No LMP recorded (lmp unknown). Patient has had a hysterectomy.           Sexually active: Yes.    The current method of family planning is tubal ligation.    Exercising: No.  The patient does not participate in regular exercise at present. Smoker:  Yes, smokes 15 cigs/day  Health Maintenance: Pap:09-05-18 Neg:Neg HR HPV,  12/16/16 ASCUS: Neg HR HPV; 12-11-15 Neg:Neg HR HPV History of abnormal Pap:  Yes, 10/2011 hx Colpo/LEEP procedure;between 2010 and 2013 colpo/cryotherapy to cervix MMG: 08-04-19 3D/Neg/densityB/BiRads1  Colonoscopy: 06/2018 polyps;next 3-5 years. BMD:   n/a  Result  n/a TDaP:  2015 Gardasil:   no HIV:Neg in preg Hep C:no Screening Labs:  PCP. Flu vaccine:  Completed.   reports that she has been smoking cigarettes. She has a 26.25 pack-year smoking history. She has never used smokeless tobacco. She reports current alcohol use. She reports that she does not use drugs.  Past Medical History:  Diagnosis Date  . Abnormal Pap smear of cervix    2010-2014 abnormal pap every 3-6 months   dysplastic squamous cells of cervix   . Anxiety    occ.- no meds  . Blood in stool   . Depression    no meds  . Diverticulitis 08/2019  . DUB (dysfunctional uterine bleeding)   . Dyspnea    Resolved - due to anemia - no current problems  . Eczema   . Endometriosis   . Epigastric pain   . Fatigue   . Fibroid   . GERD (gastroesophageal reflux disease)   . Grade I diastolic dysfunction 0000000   Noted on ECHO  . Hemorrhoid    2nd and 3rd degree  .  History of adenomatous polyp of colon   . History of palpitations   . HSV infection   . Hyperlipidemia    diet controlled - no meds  . Hypertension    not currently taking medication  . Infertility, female   . Iron deficiency anemia    Last iron infusion 09/14/2018  . Left groin hernia    history of  . Muscle cramping    with exertion  . Recurrent bacterial infection    Vaginal  . Restless leg    due to anemia  . Smoker   . SVD (spontaneous vaginal delivery)    x 2  . Thoracic back pain    while trying to sleep  . Tinnitus, left    hears heart beat, MRI normal  . Vertigo     Past Surgical History:  Procedure Laterality Date  . ABLATION ON ENDOMETRIOSIS  1995  . CERVICAL BIOPSY  W/ LOOP ELECTRODE EXCISION    . CERVICAL CONIZATION W/BX N/A 09/20/2018   Procedure: CONIZATION CERVIX WITH BIOPSY;  Surgeon: Nunzio Cobbs, MD;  Location: [facility];  Service: Gynecology;  Laterality: N/A;  . COLONOSCOPY  06/2018   x 4  . COLPOSCOPY     x 3  . CYSTOSCOPY N/A 11/14/2018   Procedure:  CYSTOSCOPY;  Surgeon: Nunzio Cobbs, MD;  Location: [facility];  Service: Gynecology;  Laterality: N/A;  . DILATION AND CURETTAGE OF UTERUS  1993  . DILATION AND CURETTAGE OF UTERUS N/A 09/20/2018   Procedure: FRACTIONAL DILATATION AND CURETTAGE;  Surgeon: Nunzio Cobbs, MD;  Location: [facility];  Service: Gynecology;  Laterality: N/A;  . GYNECOLOGIC CRYOSURGERY    . HEMORRHOID BANDING     several  . HERNIA REPAIR Left    childhood  . INTRAUTERINE DEVICE (IUD) INSERTION  2013    (Mirena)  . INTRAUTERINE DEVICE INSERTION  2000   [place]  . IUD REMOVAL  2010  . IUD REMOVAL  2018  . TOTAL LAPAROSCOPIC HYSTERECTOMY WITH SALPINGECTOMY Bilateral 11/14/2018   Procedure: TOTAL LAPAROSCOPIC HYSTERECTOMY WITH BILATERAL SALPINGECTOMY AND LYSIS OF ADHESIONS;  Surgeon: Nunzio Cobbs, MD;  Location: [facility];  Service: Gynecology;   Laterality: Bilateral;  . TUBAL LIGATION     2010  . UPPER GI ENDOSCOPY  06/2018   x 2  . WISDOM TOOTH EXTRACTION      Current Outpatient Medications  Medication Sig Dispense Refill  . acetaminophen (TYLENOL) 325 MG tablet Take 2 tablets (650 mg total) by mouth every 6 (six) hours as needed for mild pain, fever or headache.    Marland Kitchen amoxicillin-clavulanate (AUGMENTIN) 875-125 MG tablet Take 1 tablet by mouth every 12 (twelve) hours for 8 days. 16 tablet 0  . hydrochlorothiazide (HYDRODIURIL) 25 MG tablet Take 12.5 mg by mouth daily.    Marland Kitchen ibuprofen (ADVIL) 200 MG tablet Take 600-800 mg by mouth every 6 (six) hours as needed for moderate pain.    Marland Kitchen omeprazole (PRILOSEC) 40 MG capsule Take 40 mg by mouth every morning.      No current facility-administered medications for this visit.     Family History  Problem Relation Age of Onset  . Cancer Mother 82       Dec.Stage IV colon ca 10/2013--age 6  . Heart disease Father   . Heart disease Paternal Grandfather   . Other Paternal Grandfather        Dec MVA  . Other Brother        Dec in a MVA age 24  . Alzheimer's disease Maternal Grandfather        Dec  . Other Maternal Grandfather   . Other Paternal Grandmother        Dec MVA  . Heart failure Paternal Aunt        Stage 4 colon cancer    Review of Systems  All other systems reviewed and are negative.   Exam:   BP 120/82 (Cuff Size: Large)   Pulse 80   Temp 97.7 F (36.5 C) (Temporal)   Resp 16   Ht 5' 7.25" (1.708 m)   Wt 231 lb 6.4 oz (105 kg)   LMP  (LMP Unknown) Comment: LMP 06/2018  BMI 35.97 kg/m     General appearance: alert, cooperative and appears stated age Head: normocephalic, without obvious abnormality, atraumatic Neck: no adenopathy, supple, symmetrical, trachea midline and thyroid normal to inspection and palpation Lungs: clear to auscultation bilaterally Breasts: normal appearance, no masses or tenderness, No nipple retraction or dimpling, No nipple  discharge or bleeding, No axillary adenopathy Heart: regular rate and rhythm Abdomen: soft, non-tender; no masses, no organomegaly Extremities: extremities normal, atraumatic, no cyanosis or edema Skin: skin color, texture, turgor normal. No rashes or lesions Lymph nodes: cervical,  supraclavicular, and axillary nodes normal. Neurologic: grossly normal  Pelvic: External genitalia:  no lesions              No abnormal inguinal nodes palpated.              Urethra:  normal appearing urethra with no masses, tenderness or lesions              Bartholins and Skenes: normal                 Vagina: normal appearing vagina with normal color and discharge, no lesions              Cervix: absent              Pap taken: No. Bimanual Exam:  Uterus:  absent              Adnexa: no mass, fullness, tenderness              Rectal exam: No. Declined.   Chaperone was present for exam.  Assessment:   Well woman visit with normal exam. Status post laparoscopic hysterectomy/bilateral salpingectomy, cystoscopy. CIN II.  Vulvovaginitis. Diverticulitis.  FH colon smoker.  Tobacco use.   Plan: Mammogram screening yearly. Self breast awareness reviewed. Pap and HR HPV today. Guidelines for Calcium, Vitamin D, regular exercise program including cardiovascular and weight bearing exercise. Affirm.  Diflucan 150 mg po now and repeat in 72 hours prn.  Follow up annually and prn.   After visit summary provided.

## 2019-09-20 NOTE — Patient Instructions (Signed)

## 2019-09-21 LAB — VAGINITIS/VAGINOSIS, DNA PROBE
Candida Species: POSITIVE — AB
Gardnerella vaginalis: NEGATIVE
Trichomonas vaginosis: NEGATIVE

## 2019-09-25 LAB — CYTOLOGY - PAP
Comment: NEGATIVE
Diagnosis: NEGATIVE
High risk HPV: NEGATIVE

## 2019-09-27 ENCOUNTER — Telehealth: Payer: Self-pay | Admitting: *Deleted

## 2019-09-27 NOTE — Telephone Encounter (Signed)
Called pt and she was having her appt moved to [place]. 12.  I asked if she had [facility] sheets and she does. I have asked her to go 2 days prior to having video visit so that we will have lab work to review

## 2019-09-29 ENCOUNTER — Inpatient Hospital Stay: Payer: Managed Care, Other (non HMO) | Admitting: Oncology

## 2019-10-29 ENCOUNTER — Encounter (HOSPITAL_COMMUNITY): Payer: Self-pay

## 2019-10-29 ENCOUNTER — Inpatient Hospital Stay (HOSPITAL_COMMUNITY)
Admission: EM | Admit: 2019-10-29 | Discharge: 2019-11-01 | DRG: 392 | Disposition: A | Discharge status: Home / Self Care | Payer: Managed Care, Other (non HMO) | Attending: Surgery | Admitting: Surgery

## 2019-10-29 DIAGNOSIS — K5792 Diverticulitis of intestine, part unspecified, without perforation or abscess without bleeding: Secondary | ICD-10-CM | POA: Diagnosis not present

## 2019-10-29 DIAGNOSIS — Z90710 Acquired absence of both cervix and uterus: Secondary | ICD-10-CM

## 2019-10-29 DIAGNOSIS — Z800 Family history of malignant neoplasm of digestive organs: Secondary | ICD-10-CM

## 2019-10-29 DIAGNOSIS — K219 Gastro-esophageal reflux disease without esophagitis: Secondary | ICD-10-CM | POA: Diagnosis present

## 2019-10-29 DIAGNOSIS — D649 Anemia, unspecified: Secondary | ICD-10-CM | POA: Diagnosis present

## 2019-10-29 DIAGNOSIS — K5732 Diverticulitis of large intestine without perforation or abscess without bleeding: Principal | ICD-10-CM | POA: Diagnosis present

## 2019-10-29 DIAGNOSIS — Z79899 Other long term (current) drug therapy: Secondary | ICD-10-CM

## 2019-10-29 DIAGNOSIS — Z20822 Contact with and (suspected) exposure to covid-19: Secondary | ICD-10-CM | POA: Diagnosis present

## 2019-10-29 DIAGNOSIS — F17210 Nicotine dependence, cigarettes, uncomplicated: Secondary | ICD-10-CM | POA: Diagnosis present

## 2019-10-29 LAB — CBC
HCT: 37 % (ref 36.0–46.0)
Hemoglobin: 11.3 g/dL — ABNORMAL LOW (ref 12.0–15.0)
MCH: 24.6 pg — ABNORMAL LOW (ref 26.0–34.0)
MCHC: 30.5 g/dL (ref 30.0–36.0)
MCV: 80.6 fL (ref 80.0–100.0)
Platelets: 403 10*3/uL — ABNORMAL HIGH (ref 150–400)
RBC: 4.59 MIL/uL (ref 3.87–5.11)
RDW: 14.8 % (ref 11.5–15.5)
WBC: 14.4 10*3/uL — ABNORMAL HIGH (ref 4.0–10.5)
nRBC: 0 % (ref 0.0–0.2)

## 2019-10-29 LAB — COMPREHENSIVE METABOLIC PANEL
ALT: 23 U/L (ref 0–44)
AST: 23 U/L (ref 15–41)
Albumin: 3.8 g/dL (ref 3.5–5.0)
Alkaline Phosphatase: 80 U/L (ref 38–126)
Anion gap: 13 (ref 5–15)
BUN: 11 mg/dL (ref 6–20)
CO2: 23 mmol/L (ref 22–32)
Calcium: 9.1 mg/dL (ref 8.9–10.3)
Chloride: 99 mmol/L (ref 98–111)
Creatinine, Ser: 0.97 mg/dL (ref 0.44–1.00)
GFR calc Af Amer: >60 mL/min (ref >60)
GFR calc non Af Amer: >60 mL/min (ref >60)
Glucose, Bld: 105 mg/dL — ABNORMAL HIGH (ref 70–99)
Potassium: 4.6 mmol/L (ref 3.5–5.1)
Sodium: 135 mmol/L (ref 135–145)
Total Bilirubin: 0.4 mg/dL (ref 0.3–1.2)
Total Protein: 6.9 g/dL (ref 6.5–8.1)

## 2019-10-29 LAB — URINALYSIS, ROUTINE W REFLEX MICROSCOPIC
Bilirubin Urine: NEGATIVE
Glucose, UA: NEGATIVE mg/dL
Hgb urine dipstick: NEGATIVE
Ketones, ur: NEGATIVE mg/dL
Leukocytes,Ua: NEGATIVE
Nitrite: NEGATIVE
Protein, ur: NEGATIVE mg/dL
Specific Gravity, Urine: 1.019 (ref 1.005–1.030)
pH: 7 (ref 5.0–8.0)

## 2019-10-29 LAB — I-STAT BETA HCG BLOOD, ED (MC, WL, AP ONLY): I-stat hCG, quantitative: <5 m[IU]/mL (ref <5)

## 2019-10-29 LAB — LIPASE, BLOOD: Lipase: 29 U/L (ref 11–51)

## 2019-10-29 MED ORDER — SODIUM CHLORIDE 0.9% FLUSH: 3.0000 mL | Freq: Once | INTRAVENOUS | Status: DC

## 2019-10-29 NOTE — ED Triage Notes (Signed)
Pt reports that she began to have RLQ abd pain today, hx of diverticulitis, feels the same, no n/v

## 2019-10-30 ENCOUNTER — Emergency Department (HOSPITAL_COMMUNITY): Payer: Managed Care, Other (non HMO)

## 2019-10-30 ENCOUNTER — Encounter (HOSPITAL_COMMUNITY): Payer: Self-pay

## 2019-10-30 DIAGNOSIS — Z800 Family history of malignant neoplasm of digestive organs: Secondary | ICD-10-CM | POA: Diagnosis not present

## 2019-10-30 DIAGNOSIS — Z20822 Contact with and (suspected) exposure to covid-19: Secondary | ICD-10-CM | POA: Diagnosis present

## 2019-10-30 DIAGNOSIS — K219 Gastro-esophageal reflux disease without esophagitis: Secondary | ICD-10-CM | POA: Diagnosis present

## 2019-10-30 DIAGNOSIS — F17210 Nicotine dependence, cigarettes, uncomplicated: Secondary | ICD-10-CM | POA: Diagnosis present

## 2019-10-30 DIAGNOSIS — K5792 Diverticulitis of intestine, part unspecified, without perforation or abscess without bleeding: Secondary | ICD-10-CM | POA: Diagnosis present

## 2019-10-30 DIAGNOSIS — K5732 Diverticulitis of large intestine without perforation or abscess without bleeding: Secondary | ICD-10-CM | POA: Diagnosis present

## 2019-10-30 DIAGNOSIS — Z90710 Acquired absence of both cervix and uterus: Secondary | ICD-10-CM | POA: Diagnosis not present

## 2019-10-30 DIAGNOSIS — D649 Anemia, unspecified: Secondary | ICD-10-CM | POA: Diagnosis present

## 2019-10-30 DIAGNOSIS — Z79899 Other long term (current) drug therapy: Secondary | ICD-10-CM | POA: Diagnosis not present

## 2019-10-30 LAB — SARS CORONAVIRUS 2 (TAT 6-24 HRS): SARS Coronavirus 2: NEGATIVE

## 2019-10-30 MED ORDER — ACETAMINOPHEN 325 MG PO TABS
650.0000 mg | ORAL_TABLET | Freq: Four times a day (QID) | ORAL | Status: DC | PRN
  Administered 2019-10-30 – 2019-10-31 (×2): 650 mg via ORAL
  Filled 2019-10-30 (×2): qty 2

## 2019-10-30 MED ORDER — ACETAMINOPHEN 325 MG PO TABS
650.0000 mg | ORAL_TABLET | Freq: Once | ORAL | Status: AC
  Administered 2019-10-30: 650 mg via ORAL
  Filled 2019-10-30: qty 2

## 2019-10-30 MED ORDER — METHOCARBAMOL 1000 MG/10ML IJ SOLN
500.0000 mg | Freq: Three times a day (TID) | INTRAVENOUS | Status: DC
  Administered 2019-10-30 – 2019-11-01 (×6): 500 mg via INTRAVENOUS
  Filled 2019-10-30 (×7): qty 5
  Filled 2019-10-30: qty 500
  Filled 2019-10-30: qty 5
  Filled 2019-10-30: qty 500
  Filled 2019-10-30 (×2): qty 5

## 2019-10-30 MED ORDER — HYDROCHLOROTHIAZIDE 25 MG PO TABS
12.5000 mg | ORAL_TABLET | Freq: Every day | ORAL | Status: DC
  Administered 2019-10-30 – 2019-11-01 (×3): 12.5 mg via ORAL
  Filled 2019-10-30 (×3): qty 1

## 2019-10-30 MED ORDER — IOHEXOL 300 MG/ML  SOLN
100.0000 mL | Freq: Once | INTRAMUSCULAR | Status: AC
  Administered 2019-10-30: 100 mL via INTRAVENOUS

## 2019-10-30 MED ORDER — ACETAMINOPHEN 650 MG RE SUPP: 650.0000 mg | Freq: Four times a day (QID) | RECTAL | Status: DC | PRN

## 2019-10-30 MED ORDER — METOPROLOL TARTRATE 5 MG/5ML IV SOLN: 5.0000 mg | Freq: Four times a day (QID) | INTRAVENOUS | Status: DC | PRN

## 2019-10-30 MED ORDER — DIPHENHYDRAMINE HCL 50 MG/ML IJ SOLN: 25.0000 mg | Freq: Four times a day (QID) | INTRAMUSCULAR | Status: DC | PRN

## 2019-10-30 MED ORDER — SODIUM CHLORIDE 0.9 % IV SOLN: INTRAVENOUS | Status: DC

## 2019-10-30 MED ORDER — MORPHINE SULFATE (PF) 4 MG/ML IV SOLN
4.0000 mg | Freq: Once | INTRAVENOUS | Status: AC
  Administered 2019-10-30: 4 mg via INTRAVENOUS
  Filled 2019-10-30: qty 1

## 2019-10-30 MED ORDER — PIPERACILLIN-TAZOBACTAM 3.375 G IVPB
3.3750 g | Freq: Three times a day (TID) | INTRAVENOUS | Status: DC
  Administered 2019-10-30 – 2019-11-01 (×6): 3.375 g via INTRAVENOUS
  Filled 2019-10-30 (×6): qty 50

## 2019-10-30 MED ORDER — OXYCODONE HCL 5 MG PO TABS
5.0000 mg | ORAL_TABLET | ORAL | Status: DC | PRN
  Administered 2019-10-31: 10 mg via ORAL
  Administered 2019-10-31: 5 mg via ORAL
  Administered 2019-11-01: 10 mg via ORAL
  Filled 2019-10-30: qty 2
  Filled 2019-10-30: qty 1
  Filled 2019-10-30: qty 2

## 2019-10-30 MED ORDER — ONDANSETRON HCL 4 MG/2ML IJ SOLN
4.0000 mg | Freq: Once | INTRAMUSCULAR | Status: AC
  Administered 2019-10-30: 4 mg via INTRAVENOUS
  Filled 2019-10-30: qty 2

## 2019-10-30 MED ORDER — ONDANSETRON 4 MG PO TBDP: 4.0000 mg | ORAL_TABLET | Freq: Four times a day (QID) | ORAL | Status: DC | PRN

## 2019-10-30 MED ORDER — MORPHINE SULFATE (PF) 2 MG/ML IV SOLN
2.0000 mg | INTRAVENOUS | Status: DC | PRN
  Administered 2019-10-30 – 2019-10-31 (×3): 2 mg via INTRAVENOUS
  Filled 2019-10-30 (×3): qty 1

## 2019-10-30 MED ORDER — ONDANSETRON HCL 4 MG/2ML IJ SOLN: 4.0000 mg | Freq: Four times a day (QID) | INTRAMUSCULAR | Status: DC | PRN

## 2019-10-30 MED ORDER — PANTOPRAZOLE SODIUM 40 MG IV SOLR
40.0000 mg | Freq: Every day | INTRAVENOUS | Status: DC
  Administered 2019-10-30 – 2019-10-31 (×2): 40 mg via INTRAVENOUS
  Filled 2019-10-30 (×2): qty 40

## 2019-10-30 MED ORDER — SODIUM CHLORIDE 0.9 % IV BOLUS
1000.0000 mL | Freq: Once | INTRAVENOUS | Status: AC
  Administered 2019-10-30: 1000 mL via INTRAVENOUS

## 2019-10-30 MED ORDER — DIPHENHYDRAMINE HCL 25 MG PO CAPS: 25.0000 mg | ORAL_CAPSULE | Freq: Four times a day (QID) | ORAL | Status: DC | PRN

## 2019-10-30 MED ORDER — ENOXAPARIN SODIUM 40 MG/0.4ML ~~LOC~~ SOLN
40.0000 mg | SUBCUTANEOUS | Status: DC
  Administered 2019-10-30 – 2019-11-01 (×3): 40 mg via SUBCUTANEOUS
  Filled 2019-10-30 (×3): qty 0.4

## 2019-10-30 NOTE — ED Provider Notes (Signed)
[name] Provider Note   CSN: [id number] Arrival date & time: 10/29/19  1934     History Chief Complaint  Patient presents with  . Diverticulitis    Andrea [name] is a 49 y.o. female w PMHx of HTN, diverticulitis, anemia, GERD, CHF, presenting to the ED with complaint of abdominal pain that began 24 hours ago. Pain is in the RLQ, radiating towards her back and into RUQ, described as sharp, worse with movement. Symptoms feel similar to prior episodes of diverticulitis, only difference today is the shooting/sharp pain. She also reported some bright red blood in her stool, however this is a chronic issue for her and unchanged.  Does not endorse associated fevers.  Denies N/V/D/C, urinary symptoms.  She was recently admitted in November 2020 for diverticulitis in the same area, right lower quadrant.  She was given IV antibiotics subsequently discharged.  She states she has had outpatient follow-up with Dr. Ninfa Linden with general surgery since then.  Past abdominal surgeries include abdominal hysterectomy.  The history is provided by the patient and medical records.       Past Medical History:  Diagnosis Date  . Abnormal Pap smear of cervix    2010-2014 abnormal pap every 3-6 months   dysplastic squamous cells of cervix   . Anxiety    occ.- no meds  . Blood in stool   . Depression    no meds  . Diverticulitis 08/2019  . DUB (dysfunctional uterine bleeding)   . Dyspnea    Resolved - due to anemia - no current problems  . Eczema   . Endometriosis   . Epigastric pain   . Fatigue   . Fibroid   . GERD (gastroesophageal reflux disease)   . Grade I diastolic dysfunction 0000000   Noted on ECHO  . Hemorrhoid    2nd and 3rd degree  . History of adenomatous polyp of colon   . History of palpitations   . HSV infection   . Hyperlipidemia    diet controlled - no meds  . Hypertension    not currently taking medication  . Infertility,  female   . Iron deficiency anemia    Last iron infusion 09/14/2018  . Left groin hernia    history of  . Muscle cramping    with exertion  . Recurrent bacterial infection    Vaginal  . Restless leg    due to anemia  . Smoker   . SVD (spontaneous vaginal delivery)    x 2  . Thoracic back pain    while trying to sleep  . Tinnitus, left    hears heart beat, MRI normal  . Vertigo     Patient Active Problem List   Diagnosis Date Noted  . Diverticulitis 09/11/2019  . Iron deficiency anemia 09/12/2018  . Pelvic pain 08/25/2018  . Dyspareunia in female 08/25/2018  . Vertigo 09/15/2017    Past Surgical History:  Procedure Laterality Date  . ABLATION ON ENDOMETRIOSIS  1995  . CERVICAL BIOPSY  W/ LOOP ELECTRODE EXCISION    . CERVICAL CONIZATION W/BX N/A 09/20/2018   Procedure: CONIZATION CERVIX WITH BIOPSY;  Surgeon: Nunzio Cobbs, MD;  Location: [facility];  Service: Gynecology;  Laterality: N/A;  . COLONOSCOPY  06/2018   x 4  . COLPOSCOPY     x 3  . CYSTOSCOPY N/A 11/14/2018   Procedure: CYSTOSCOPY;  Surgeon: Nunzio Cobbs, MD;  Location:  [facility];  Service: Gynecology;  Laterality: N/A;  . DILATION AND CURETTAGE OF UTERUS  1993  . DILATION AND CURETTAGE OF UTERUS N/A 09/20/2018   Procedure: FRACTIONAL DILATATION AND CURETTAGE;  Surgeon: Nunzio Cobbs, MD;  Location[facility];  Service: Gynecology;  Laterality: N/A;  . GYNECOLOGIC CRYOSURGERY    . HEMORRHOID BANDING     several  . HERNIA REPAIR Left    childhood  . INTRAUTERINE DEVICE (IUD) INSERTION  2013    (Mirena)  . INTRAUTERINE DEVICE INSERTION  2000   [facility]  . IUD REMOVAL  2010  . IUD REMOVAL  2018  . TOTAL LAPAROSCOPIC HYSTERECTOMY WITH SALPINGECTOMY Bilateral 11/14/2018   Procedure: TOTAL LAPAROSCOPIC HYSTERECTOMY WITH BILATERAL SALPINGECTOMY AND LYSIS OF ADHESIONS;  Surgeon: Nunzio Cobbs, MD;  Location: [facility];  Service:  Gynecology;  Laterality: Bilateral;  . TUBAL LIGATION     2010  . UPPER GI ENDOSCOPY  06/2018   x 2  . WISDOM TOOTH EXTRACTION       OB History    Gravida  3   Para  2   Term      Preterm      AB  1   Living  2     SAB  1   TAB      Ectopic      Multiple      Live Births              Family History  Problem Relation Age of Onset  . Cancer Mother 63       Dec.Stage IV colon ca 10/2013--age 14  . Heart disease Father   . Heart disease Paternal Grandfather   . Other Paternal Grandfather        Dec MVA  . Other Brother        Dec in a MVA age 110  . Alzheimer's disease Maternal Grandfather        Dec  . Other Maternal Grandfather   . Other Paternal Grandmother        Dec MVA  . Heart failure Paternal Aunt        Stage 4 colon cancer    Social History   Tobacco Use  . Smoking status: Current Every Day Smoker    Packs/day: 0.75    Years: 35.00    Pack years: 26.25    Types: Cigarettes  . Smokeless tobacco: Never Used  Substance Use Topics  . Alcohol use: Yes    Comment: 6-7 drinks a week (alcohol)  Red Bull and Vodka or red wine  . Drug use: Never    Home Medications Prior to Admission medications   Medication Sig Start Date End Date Taking? Authorizing Provider  acetaminophen (TYLENOL) 325 MG tablet Take 2 tablets (650 mg total) by mouth every 6 (six) hours as needed for mild pain, fever or headache. 09/13/19   Rayburn, Floyce Stakes, PA-C  hydrochlorothiazide (HYDRODIURIL) 25 MG tablet Take 12.5 mg by mouth daily.    [provider]  ibuprofen (ADVIL) 200 MG tablet Take 600-800 mg by mouth every 6 (six) hours as needed for moderate pain.    [provider]  omeprazole (PRILOSEC) 40 MG capsule Take 40 mg by mouth every morning.     [provider]    Allergies    Patient has no known allergies.  Review of Systems   Review of Systems  Constitutional: Positive for fever.  Gastrointestinal:  Positive for abdominal pain  and blood in stool. Negative for constipation, diarrhea, nausea and vomiting.  All other systems reviewed and are negative.   Physical Exam Updated Vital Signs BP 133/78   Pulse 92   Temp 99 F (37.2 C) (Oral)   Resp 16   LMP  (LMP Unknown) Comment: LMP 06/2018  SpO2 97%   Physical Exam Vitals and nursing note reviewed.  Constitutional:      General: She is not in acute distress.    Appearance: She is well-developed. She is obese. She is not ill-appearing.  HENT:     Head: Normocephalic and atraumatic.  Eyes:     Conjunctiva/sclera: Conjunctivae normal.  Cardiovascular:     Rate and Rhythm: Regular rhythm. Tachycardia present.  Pulmonary:     Effort: Pulmonary effort is normal. No respiratory distress.     Breath sounds: Normal breath sounds.  Abdominal:     General: Bowel sounds are normal.     Palpations: Abdomen is soft.     Tenderness: There is abdominal tenderness. There is no guarding or rebound.     Comments: Generalized palpation of the abdomen causes pain in the right lower quadrant.  TTP in the right lower quadrant.  Skin:    General: Skin is warm.  Neurological:     Mental Status: She is alert.  Psychiatric:        Behavior: Behavior normal.     ED Results / Procedures / Treatments   Labs (all labs ordered are listed, but only abnormal results are displayed) Labs Reviewed  COMPREHENSIVE METABOLIC PANEL - Abnormal; Notable for the following components:      Result Value   Glucose, Bld 105 (*)    All other components within normal limits  CBC - Abnormal; Notable for the following components:   WBC 14.4 (*)    Hemoglobin 11.3 (*)    MCH 24.6 (*)    Platelets 403 (*)    All other components within normal limits  URINALYSIS, ROUTINE W REFLEX MICROSCOPIC - Abnormal; Notable for the following components:   APPearance HAZY (*)    All other components within normal limits  SARS CORONAVIRUS 2 (TAT 6-24 HRS)  LIPASE, BLOOD  I-STAT BETA HCG BLOOD, ED ([facility],  [facility], [facility] ONLY)    EKG None  Radiology CT Abdomen Pelvis W Contrast  Result Date: 10/30/2019 CLINICAL DATA:  Lower abdominal pain, primarily right-sided EXAM: CT ABDOMEN AND PELVIS WITH CONTRAST TECHNIQUE: Multidetector CT imaging of the abdomen and pelvis was performed using the standard protocol following bolus administration of intravenous contrast. CONTRAST:  100 mL  OMNIPAQUE IOHEXOL 300 MG/ML  SOLN COMPARISON:  September 11, 2019 FINDINGS: Lower chest: There is atelectatic change in the left base. The lung bases elsewhere are clear. Hepatobiliary: No focal liver lesions are evident. Gallbladder wall is not appreciably thickened. There is no biliary duct dilatation. Pancreas: There is no pancreatic mass or inflammatory focus. Spleen: No splenic lesions are evident. Adrenals/Urinary Tract: There is a 1.1 x 1 cm left adrenal adenoma. There is an apparent adenoma arising from the right adrenal measuring 1.20.9 cm. Adrenals appear stable compared to prior study. Kidneys bilaterally show no evident mass or hydronephrosis on either side. There is no evident renal or ureteral calculus. Urinary bladder is midline with wall thickness within normal limits. Stomach/Bowel: There is again noted wall thickening in the sigmoid colon with surrounding mesenteric thickening. There is thickening of the terminal ileum in this area, likely due to inflammation  arising from the cecum. The nearby appendix appears unchanged with borderline prominence the appendix, likely due to inflammation from the cecum. A small amount of fluid is noted in the lateral conal fascia. A small amount of fluid is noted adjacent to the cecum along its inferolateral aspect. A well-defined abscess is not seen, however. Elsewhere, there are multiple colonic diverticula diffusely without evidence of diverticulitis elsewhere. No evident bowel obstruction. No free air or portal venous air is evident. Vascular/Lymphatic: There is no abdominal aortic aneurysm.  There is aortic atherosclerosis. No adenopathy is evident in the abdomen or pelvis. Reproductive: Uterus is absent.  No evident pelvic mass. Other: No evident abscess or ascites in the abdomen or pelvis. Appendix measures 10 mm in thickness which is prominent but similar to the prior study and as noted above is felt to be due to the nearby inflammation from the cecum as opposed to inflammation arising from the appendix itself. There is a small umbilical hernia. Musculoskeletal: No blastic or lytic bone lesions. No intramuscular or abdominal wall lesions are evident. IMPRESSION: 1. There is again noted evidence of diverticulitis in the region of the cecum with associated thickening of the wall of the terminal ileum and appendix. Inflammation appears to arise from the cecum as opposed to arising from the terminal ileum or appendix. The appearance of the appendix is similar to the prior study. There is more terminal ileal inflammation compared to the previous study. There is mild fluid surrounding the cecum as well as mesenteric thickening surrounding this area in the right lower quadrant. A well-defined abscess is not seen by CT. Note that the persistence of these inflammatory changes involving the cecum and terminal ileum may warrant surgical assessment at this time. 2. Widespread colonic diverticulosis elsewhere without evidence of diverticulitis elsewhere. 3. No bowel obstruction. No well-defined abscess in the abdomen or pelvis. 4. No renal or ureteral calculi. No hydronephrosis. Urinary bladder wall thickness normal. 5.  Apparent small adrenal adenomas bilaterally, stable. 6.  Uterus absent. Electronically Signed   By: Lowella Grip III M.D.   On: 10/30/2019 07:54    Procedures Procedures (including critical care time)  Medications Ordered in ED Medications  sodium chloride flush (NS) 0.9 % injection 3 mL (has no administration in time range)  enoxaparin (LOVENOX) injection 40 mg (has no  administration in time range)  0.9 %  sodium chloride infusion ( Intravenous New Bag/Given 10/30/19 1215)  piperacillin-tazobactam (ZOSYN) IVPB 3.375 g (3.375 g Intravenous New Bag/Given 10/30/19 1221)  acetaminophen (TYLENOL) tablet 650 mg (has no administration in time range)    Or  acetaminophen (TYLENOL) suppository 650 mg (has no administration in time range)  oxyCODONE (Oxy IR/ROXICODONE) immediate release tablet 5-10 mg (has no administration in time range)  methocarbamol (ROBAXIN) 500 mg in dextrose 5 % 50 mL IVPB (has no administration in time range)  morphine 2 MG/ML injection 2 mg (has no administration in time range)  diphenhydrAMINE (BENADRYL) capsule 25 mg (has no administration in time range)    Or  diphenhydrAMINE (BENADRYL) injection 25 mg (has no administration in time range)  ondansetron (ZOFRAN-ODT) disintegrating tablet 4 mg (has no administration in time range)    Or  ondansetron (ZOFRAN) injection 4 mg (has no administration in time range)  pantoprazole (PROTONIX) injection 40 mg (has no administration in time range)  metoprolol tartrate (LOPRESSOR) injection 5 mg (has no administration in time range)  hydrochlorothiazide (HYDRODIURIL) tablet 12.5 mg (12.5 mg Oral Given 10/30/19 1216)  iohexol (OMNIPAQUE)  300 MG/ML solution 100 mL (100 mLs Intravenous Contrast Given 10/30/19 0727)  sodium chloride 0.9 % bolus 1,000 mL (0 mLs Intravenous Stopped 10/30/19 1224)  ondansetron (ZOFRAN) injection 4 mg (4 mg Intravenous Given 10/30/19 1050)  morphine 4 MG/ML injection 4 mg (4 mg Intravenous Given 10/30/19 1050)  acetaminophen (TYLENOL) tablet 650 mg (650 mg Oral Given 10/30/19 1050)    ED Course  I have reviewed the triage vital signs and the nursing notes.  Pertinent labs & imaging results that were available during my care of the patient were reviewed by me and considered in my medical decision making (see chart for details).    MDM Rules/Calculators/A&P                       Patient with recent diverticulitis in November 2020 requiring admission under general surgery service, presenting with recurrent symptoms over the last few days.  Symptoms are somewhat worse.  She has associated fever.  On arrival, patient is febrile and tachycardic though normotensive.  She is overall well-appearing and in no distress though abdomen is diffusely tender, worse in the right lower quadrant.  Labs and imaging obtained in triage reveal leukocytosis of 14.4.  Metabolic panel, lipase and UA are unremarkable.  CT scan of the abdomen reveals recurrent diverticulitis about the cecum and terminal ileum, however there are increasing signs of inflammation surrounding this area.  No discrete abscess is visualized.  Appendix appears normal. Patient given IV fluids and symptomatic management.   Discussed with general surgery, who will evaluate patient and admit for further management.  Surgery to order antibiotics.  Final Clinical Impression(s) / ED Diagnoses Final diagnoses:  Acute diverticulitis    Rx / DC Orders ED Discharge Orders    None       Ishanvi Mcquitty, [place] N, PA-C 10/30/19 1229    Tegeler, Gwenyth Allegra, MD 10/30/19 [phone]

## 2019-10-30 NOTE — H&P (Addendum)
Andrea [name] May 02, 1971  [id number].    Requesting MD: Martinique Robinson, PA-C Chief Complaint/Reason for Consult: Diverticulitis, recurrent  HPI: Andrea [name] is a 49 y.o. female with a history of HTN, HLD who presented to [facility] today for abdominal pain.  Patient was admitted from 11/16-11/18 for diverticulitis that improved with IV antibiotics and bowel rest.  Her CT scan at that time showed Inflammatory changes centered about diverticula in the cecum and ascending colon most consistent with acute diverticulitis.  This was her first episode of diverticulitis.  She took her outpatient oral antibiotics as prescribed to completion.  She followed up in the office with Dr. Ninfa Linden on 12/5.  She reports since leaving the hospital, although feeling better she has had daily episodes of right lower quadrant abdominal pain that lasts for a few hours after eating.  She reports this pain feels similar to when she had diverticulitis in November.  She has been adhering to a soft diet.  She reports that yesterday after eating breakfast she began having a dull/achy pain in her right lower quadrant that was constant and has gradually worsened.  She states this feels like the same pain she had during her admission in November, but slightly more severe.  She also reports a sharp, intermittent pain that radiates across her abdomen to her epigastrium.  She reports associated fever, chills, and nausea.  She kept herself n.p.o. since the pain started yesterday morning. Her last BM was yesterday morning and blood streaked. She reports her BM's have been blood streaked from before she can remember 2/2 internal hemorrhoids. She contacted a physician on MD live at that recommended her going to the ED. In the ED she was febrile at 100.4 with a HR of 110. WBC 14.4. CT AP showed diverticulitis in the region of the cecum with associated thickening of the wall of the terminal ileum and appendix, diverticulitis in  the region of the cecum with associated thickening of the wall of the terminal ileum and appendix. No well defined abscess.  General surgery was asked to see and admit.  Patient reports that her last colonoscopy was in 2019 and showed polyps/diverticulosis. She states that she previously followed with Dr. Benson Norway and Dr. Earlean Shawl with GI. She reports Fhx of colon CA in her mother who was dx at the age of 19 and passed away 2/2 to this. No Fhx of IBD. She is not on any blood thinners. She has had a prior hernia repair when she was a child, dx laparoscopy for endometriosis in 1996 and a total laparoscopic hysterectomy in 2020.   ROS: Review of Systems  Constitutional: Positive for chills and fever.  Respiratory: Negative for cough and shortness of breath.   Cardiovascular: Negative for chest pain and leg swelling.  Gastrointestinal: Positive for abdominal pain, blood in stool, diarrhea and nausea. Negative for constipation and vomiting.  Genitourinary: Negative for dysuria.  Musculoskeletal: Negative for myalgias.  All other systems reviewed and are negative.   Family History  Problem Relation Age of Onset  . Cancer Mother 26       Dec.Stage IV colon ca 10/2013--age 52  . Heart disease Father   . Heart disease Paternal Grandfather   . Other Paternal Grandfather        Dec MVA  . Other Brother        Dec in a MVA age 25  . Alzheimer's disease Maternal Grandfather        Dec  .  Other Maternal Grandfather   . Other Paternal Grandmother        Dec MVA  . Heart failure Paternal Aunt        Stage 4 colon cancer    Past Medical History:  Diagnosis Date  . Abnormal Pap smear of cervix    2010-2014 abnormal pap every 3-6 months   dysplastic squamous cells of cervix   . Anxiety    occ.- no meds  . Blood in stool   . Depression    no meds  . Diverticulitis 08/2019  . DUB (dysfunctional uterine bleeding)   . Dyspnea    Resolved - due to anemia - no current problems  . Eczema   .  Endometriosis   . Epigastric pain   . Fatigue   . Fibroid   . GERD (gastroesophageal reflux disease)   . Grade I diastolic dysfunction 0000000   Noted on ECHO  . Hemorrhoid    2nd and 3rd degree  . History of adenomatous polyp of colon   . History of palpitations   . HSV infection   . Hyperlipidemia    diet controlled - no meds  . Hypertension    not currently taking medication  . Infertility, female   . Iron deficiency anemia    Last iron infusion 09/14/2018  . Left groin hernia    history of  . Muscle cramping    with exertion  . Recurrent bacterial infection    Vaginal  . Restless leg    due to anemia  . Smoker   . SVD (spontaneous vaginal delivery)    x 2  . Thoracic back pain    while trying to sleep  . Tinnitus, left    hears heart beat, MRI normal  . Vertigo     Past Surgical History:  Procedure Laterality Date  . ABLATION ON ENDOMETRIOSIS  1995  . CERVICAL BIOPSY  W/ LOOP ELECTRODE EXCISION    . CERVICAL CONIZATION W/BX N/A 09/20/2018   Procedure: CONIZATION CERVIX WITH BIOPSY;  Surgeon: Nunzio Cobbs, MD;  Location: [facility];  Service: Gynecology;  Laterality: N/A;  . COLONOSCOPY  06/2018   x 4  . COLPOSCOPY     x 3  . CYSTOSCOPY N/A 11/14/2018   Procedure: CYSTOSCOPY;  Surgeon: Nunzio Cobbs, MD;  Location: [facility];  Service: Gynecology;  Laterality: N/A;  . DILATION AND CURETTAGE OF UTERUS  1993  . DILATION AND CURETTAGE OF UTERUS N/A 09/20/2018   Procedure: FRACTIONAL DILATATION AND CURETTAGE;  Surgeon: Nunzio Cobbs, MD;  Location: [facility];  Service: Gynecology;  Laterality: N/A;  . GYNECOLOGIC CRYOSURGERY    . HEMORRHOID BANDING     several  . HERNIA REPAIR Left    childhood  . INTRAUTERINE DEVICE (IUD) INSERTION  2013    (Mirena)  . INTRAUTERINE DEVICE INSERTION  2000   [place]  . IUD REMOVAL  2010  . IUD REMOVAL  2018  . TOTAL LAPAROSCOPIC HYSTERECTOMY WITH  SALPINGECTOMY Bilateral 11/14/2018   Procedure: TOTAL LAPAROSCOPIC HYSTERECTOMY WITH BILATERAL SALPINGECTOMY AND LYSIS OF ADHESIONS;  Surgeon: Nunzio Cobbs, MD;  Location: [facility];  Service: Gynecology;  Laterality: Bilateral;  . TUBAL LIGATION     2010  . UPPER GI ENDOSCOPY  06/2018   x 2  . WISDOM TOOTH EXTRACTION      Social History:  reports that she has been smoking cigarettes. She has  a 26.25 pack-year smoking history. She has never used smokeless tobacco. She reports current alcohol use. She reports that she does not use drugs.  Allergies: No Known Allergies  (Not in a hospital admission)    Physical Exam: Blood pressure (!) 150/69, pulse (!) 110, temperature 99 F (37.2 C), temperature source Oral, resp. rate 18, SpO2 96 %. General: pleasant, WD, WN white female who is laying in bed in NAD HEENT: head is normocephalic, atraumatic.  Sclera are noninjected.  PERRL.  Ears and nose without any masses or lesions.  Mouth is pink and moist Heart: regular, rate, and rhythm.  Normal s1,s2. No obvious murmurs, gallops, or rubs noted.  Palpable radial and pedal pulses bilaterally Lungs: CTAB, no wheezes, rhonchi, or rales noted.  Respiratory effort nonlabored Abd: Soft, ND, tenderness of the RLQ without rebound, rigidity or guarding.  No peritonitis.  +BS, no masses, hernias, or organomegaly.  Prior laparoscopic procedure surgical sites are well-healed.  MS: all 4 extremities are symmetrical with no cyanosis, clubbing, or edema. Skin: warm and dry with no masses, lesions, or rashes Psych: A&Ox3 with an appropriate affect. Neuro: CN 3-12 grossly intact. Moves all extremities.   Results for orders placed or performed during the hospital encounter of 10/29/19 (from the past 48 hour(s))  Lipase, blood     Status: None   Collection Time: 10/29/19  7:48 PM  Result Value Ref Range   Lipase 29 11 - 51 U/L    Comment: Performed at [facility], [place] [address] [place],  [place] [address]  Comprehensive metabolic panel     Status: Abnormal   Collection Time: 10/29/19  7:48 PM  Result Value Ref Range   Sodium 135 135 - 145 mmol/L   Potassium 4.6 3.5 - 5.1 mmol/L   Chloride 99 98 - 111 mmol/L   CO2 23 22 - 32 mmol/L   Glucose, Bld 105 (H) 70 - 99 mg/dL   BUN 11 6 - 20 mg/dL   Creatinine, Ser 0.97 0.44 - 1.00 mg/dL   Calcium 9.1 8.9 - 10.3 mg/dL   Total Protein 6.9 6.5 - 8.1 g/dL   Albumin 3.8 3.5 - 5.0 g/dL   AST 23 15 - 41 U/L   ALT 23 0 - 44 U/L   Alkaline Phosphatase 80 38 - 126 U/L   Total Bilirubin 0.4 0.3 - 1.2 mg/dL   GFR calc non Af Amer >60 >60 mL/min   GFR calc Af Amer >60 >60 mL/min   Anion gap 13 5 - 15    Comment: Performed at [facility] [address] [place], [place] [address]  CBC     Status: Abnormal   Collection Time: 10/29/19  7:48 PM  Result Value Ref Range   WBC 14.4 (H) 4.0 - 10.5 K/uL   RBC 4.59 3.87 - 5.11 MIL/uL   Hemoglobin 11.3 (L) 12.0 - 15.0 g/dL   HCT 37.0 36.0 - 46.0 %   MCV 80.6 80.0 - 100.0 fL   MCH 24.6 (L) 26.0 - 34.0 pg   MCHC 30.5 30.0 - 36.0 g/dL   RDW 14.8 11.5 - 15.5 %   Platelets 403 (H) 150 - 400 K/uL   nRBC 0.0 0.0 - 0.2 %    Comment: Performed at [facility], [place] [address], [place], [place] [address]  I-Stat beta hCG blood, ED     Status: None   Collection Time: 10/29/19  8:03 PM  Result Value Ref Range  I-stat hCG, quantitative <5.0 <5 mIU/mL   Comment 3            Comment:   GEST. AGE      CONC.  (mIU/mL)   <=1 WEEK        5 - 50     2 WEEKS       50 - 500     3 WEEKS       100 - 10,000     4 WEEKS     1,000 - 30,000        FEMALE AND NON-PREGNANT FEMALE:     LESS THAN 5 mIU/mL   Urinalysis, Routine w reflex microscopic     Status: Abnormal   Collection Time: 10/29/19  8:13 PM  Result Value Ref Range   Color, Urine YELLOW YELLOW   APPearance HAZY (A) CLEAR   Specific Gravity, Urine 1.019 1.005 - 1.030   pH 7.0 5.0 - 8.0   Glucose, UA NEGATIVE NEGATIVE mg/dL   Hgb urine  dipstick NEGATIVE NEGATIVE   Bilirubin Urine NEGATIVE NEGATIVE   Ketones, ur NEGATIVE NEGATIVE mg/dL   Protein, ur NEGATIVE NEGATIVE mg/dL   Nitrite NEGATIVE NEGATIVE   Leukocytes,Ua NEGATIVE NEGATIVE    Comment: Performed at [facility], [place] [address], [place], [place] [address]   CT Abdomen Pelvis W Contrast  Result Date: 10/30/2019 CLINICAL DATA:  Lower abdominal pain, primarily right-sided EXAM: CT ABDOMEN AND PELVIS WITH CONTRAST TECHNIQUE: Multidetector CT imaging of the abdomen and pelvis was performed using the standard protocol following bolus administration of intravenous contrast. CONTRAST:  100 mL  OMNIPAQUE IOHEXOL 300 MG/ML  SOLN COMPARISON:  September 11, 2019 FINDINGS: Lower chest: There is atelectatic change in the left base. The lung bases elsewhere are clear. Hepatobiliary: No focal liver lesions are evident. Gallbladder wall is not appreciably thickened. There is no biliary duct dilatation. Pancreas: There is no pancreatic mass or inflammatory focus. Spleen: No splenic lesions are evident. Adrenals/Urinary Tract: There is a 1.1 x 1 cm left adrenal adenoma. There is an apparent adenoma arising from the right adrenal measuring 1.20.9 cm. Adrenals appear stable compared to prior study. Kidneys bilaterally show no evident mass or hydronephrosis on either side. There is no evident renal or ureteral calculus. Urinary bladder is midline with wall thickness within normal limits. Stomach/Bowel: There is again noted wall thickening in the sigmoid colon with surrounding mesenteric thickening. There is thickening of the terminal ileum in this area, likely due to inflammation arising from the cecum. The nearby appendix appears unchanged with borderline prominence the appendix, likely due to inflammation from the cecum. A small amount of fluid is noted in the lateral conal fascia. A small amount of fluid is noted adjacent to the cecum along its inferolateral aspect. A well-defined abscess is  not seen, however. Elsewhere, there are multiple colonic diverticula diffusely without evidence of diverticulitis elsewhere. No evident bowel obstruction. No free air or portal venous air is evident. Vascular/Lymphatic: There is no abdominal aortic aneurysm. There is aortic atherosclerosis. No adenopathy is evident in the abdomen or pelvis. Reproductive: Uterus is absent.  No evident pelvic mass. Other: No evident abscess or ascites in the abdomen or pelvis. Appendix measures 10 mm in thickness which is prominent but similar to the prior study and as noted above is felt to be due to the nearby inflammation from the cecum as opposed to inflammation arising from the appendix itself. There is a small umbilical hernia. Musculoskeletal: No blastic  or lytic bone lesions. No intramuscular or abdominal wall lesions are evident. IMPRESSION: 1. There is again noted evidence of diverticulitis in the region of the cecum with associated thickening of the wall of the terminal ileum and appendix. Inflammation appears to arise from the cecum as opposed to arising from the terminal ileum or appendix. The appearance of the appendix is similar to the prior study. There is more terminal ileal inflammation compared to the previous study. There is mild fluid surrounding the cecum as well as mesenteric thickening surrounding this area in the right lower quadrant. A well-defined abscess is not seen by CT. Note that the persistence of these inflammatory changes involving the cecum and terminal ileum may warrant surgical assessment at this time. 2. Widespread colonic diverticulosis elsewhere without evidence of diverticulitis elsewhere. 3. No bowel obstruction. No well-defined abscess in the abdomen or pelvis. 4. No renal or ureteral calculi. No hydronephrosis. Urinary bladder wall thickness normal. 5.  Apparent small adrenal adenomas bilaterally, stable. 6.  Uterus absent. Electronically Signed   By: Lowella Grip III M.D.   On:  10/30/2019 07:54    Anti-infectives (From admission, onward)   Start     Dose/Rate Route Frequency Ordered Stop   10/30/19 1145  piperacillin-tazobactam (ZOSYN) IVPB 3.375 g     3.375 g 12.5 mL/hr over 240 Minutes Intravenous Every 8 hours 10/30/19 1140        Assessment/Plan Diverticulitis  - Suspect patient has smoldering diverticulitis from her first episode of diverticulitis in Nov 2019.  - CT AP showed diverticulitis in the region of the cecum with associated thickening of the wall of the terminal ileum and appendix - Will plan for IV Zosyn, IVF, bowel rest - We discussed the possibility of needing a procedure if she does not improve - Will ask for GI's input after discussing with MD  FEN - NPO, IVF VTE - SCDs, Lovenox ID - Zosyn Foley - None Dispo - Admit to inpatient  Jillyn Ledger, [facility] 10/30/2019, 11:41 AM Please see Amion for pager number during day hours 7:00am-4:30pm

## 2019-10-31 ENCOUNTER — Other Ambulatory Visit: Payer: Self-pay

## 2019-10-31 LAB — BASIC METABOLIC PANEL
Anion gap: 11 (ref 5–15)
BUN: 6 mg/dL (ref 6–20)
CO2: 24 mmol/L (ref 22–32)
Calcium: 8.4 mg/dL — ABNORMAL LOW (ref 8.9–10.3)
Chloride: 103 mmol/L (ref 98–111)
Creatinine, Ser: 0.95 mg/dL (ref 0.44–1.00)
GFR calc Af Amer: >60 mL/min (ref >60)
GFR calc non Af Amer: >60 mL/min (ref >60)
Glucose, Bld: 82 mg/dL (ref 70–99)
Potassium: 3.7 mmol/L (ref 3.5–5.1)
Sodium: 138 mmol/L (ref 135–145)

## 2019-10-31 LAB — CBC
HCT: 32.1 % — ABNORMAL LOW (ref 36.0–46.0)
Hemoglobin: 9.8 g/dL — ABNORMAL LOW (ref 12.0–15.0)
MCH: 24.4 pg — ABNORMAL LOW (ref 26.0–34.0)
MCHC: 30.5 g/dL (ref 30.0–36.0)
MCV: 80 fL (ref 80.0–100.0)
Platelets: 316 10*3/uL (ref 150–400)
RBC: 4.01 MIL/uL (ref 3.87–5.11)
RDW: 15 % (ref 11.5–15.5)
WBC: 9.3 10*3/uL (ref 4.0–10.5)
nRBC: 0 % (ref 0.0–0.2)

## 2019-10-31 MED ORDER — HYDROMORPHONE HCL 1 MG/ML IJ SOLN
0.5000 mg | INTRAMUSCULAR | Status: DC | PRN
  Administered 2019-10-31 – 2019-11-01 (×3): 0.5 mg via INTRAVENOUS
  Filled 2019-10-31 (×3): qty 1

## 2019-10-31 NOTE — Plan of Care (Signed)
  Problem: Education: Goal: Knowledge of General Education information will improve Description: Including pain rating scale, medication(s)/side effects and non-pharmacologic comfort measures Outcome: Progressing   Problem: Activity: Goal: Risk for activity intolerance will decrease Outcome: Progressing   

## 2019-10-31 NOTE — Progress Notes (Signed)
Subjective No acute events. Feeling better today than yesterday. Abd pain improved. No n/v.  Objective: Vital signs in last 24 hours: Temp:  [98.8 F (37.1 C)-99.5 F (37.5 C)] 99.2 F (37.3 C) (01/05 0342) Pulse Rate:  [78-92] 89 (01/05 0342) Resp:  [16-19] 19 (01/05 0342) BP: (133-142)/(76-81) 136/76 (01/05 0342) SpO2:  [95 %-98 %] 95 % (01/05 0342) Last BM Date: 10/29/19  Intake/Output from previous day: 01/04 0701 - 01/05 0700 In: 2011.5 [I.V.:711.5; IV Piggyback:1300] Out: -  Intake/Output this shift: No intake/output data recorded.  Gen: NAD, comfortable CV: RRR Pulm: Normal work of breathing Abd: Soft, minimally ttp right side; not significantly distended Ext: SCDs in place  Lab Results: CBC  Recent Labs    10/29/19 1948 10/31/19 0435  WBC 14.4* 9.3  HGB 11.3* 9.8*  HCT 37.0 32.1*  PLT 403* 316   BMET Recent Labs    10/29/19 1948 10/31/19 0435  NA 135 138  K 4.6 3.7  CL 99 103  CO2 23 24  GLUCOSE 105* 82  BUN 11 6  CREATININE 0.97 0.95  CALCIUM 9.1 8.4*   PT/INR No results for input(s): LABPROT, INR in the last 72 hours. ABG No results for input(s): PHART, HCO3 in the last 72 hours.  Invalid input(s): PCO2, PO2  Studies/Results:  Anti-infectives: Anti-infectives (From admission, onward)   Start     Dose/Rate Route Frequency Ordered Stop   10/30/19 1145  piperacillin-tazobactam (ZOSYN) IVPB 3.375 g     3.375 g 12.5 mL/hr over 240 Minutes Intravenous Every 8 hours 10/30/19 1140         Assessment/Plan: Patient Active Problem List   Diagnosis Date Noted  . Diverticulitis 09/11/2019  . Iron deficiency anemia 09/12/2018  . Pelvic pain 08/25/2018  . Dyspareunia in female 08/25/2018  . Vertigo 09/15/2017   -Afebrile, clinically improving, WBC normalized -Continue IV abx today -Adv to [facility]D -GI to see    LOS: 1 day   Sharon Mt. Dema Severin, M.D. [facility] Use [email or web address] to contact on call provider

## 2019-10-31 NOTE — Plan of Care (Signed)
  Problem: Pain Managment: Goal: General experience of comfort will improve Outcome: Progressing   Problem: Safety: Goal: Ability to remain free from injury will improve Outcome: Progressing   Problem: Skin Integrity: Goal: Risk for impaired skin integrity will decrease Outcome: Progressing   

## 2019-10-31 NOTE — Consult Note (Signed)
[facility] Consult  Referring Provider: Ileana Roup, MD/CCS Primary Care Physician:  Jilda Panda, MD Primary Gastroenterologist: Has a TV with Dr.Brahmbhatt on 11/02/2018(previous patient of Dr.Medoff)  Reason for Consultation: Recurrent diverticulitis  HPI: Andrea [name] is a 49 y.o. female was admitted yesterday with right-sided abdominal pain. Patient was admitted in November with right-sided diverticulitis requiring IV antibiotics, continued as oral antibiotics. Yesterday she developed pain in the right mid and lower abdomen similar to the pain she had in November(CT showed inflammatory changes in the distal small bowel, likely secondary extensive diverticulosis, acute diverticulitis of cecum and ascending with a large retrocecal appendix) which prompted her to come to the ER and on CAT scan was noted to have wall thickening in sigmoid with mesenteric thickening, thickening of terminal ileum likely from inflammation arising from the cecum, small amount of fluid in the lateral conal fascia and adjacent to cecum.  Multiple colonic diverticuli diffusely.  Patient states that her mother was diagnosed with stage IV colon cancer at 29.  She had a first colonoscopy at age 44, had few polyps removed. Subsequently she has had three more colonoscopies, last one was performed in 2019, and reports having precancerous polyps removed. She was diagnosed with anemia and had a hysterectomy but continued to remain anemic.  After diverticulitis was treated in November, she noticed that there was improvement in rectal bleeding.  Otherwise, she will have small amount of bright red blood with each bowel movement along with mucus in stool.  Normally she has at least 2-3 bowel movements a day.  Rectal bleeding was attributed to hemorrhoids and she has had hemorrhoidal banding was performed in the past. Patient denies unintentional weight loss. She denies skin rashes, oral ulcers or joint  pain, except eczema over her chest and pain on her knees and elbows which she believes is related to her weight. There is no family history of ulcerative colitis or Crohn's disease.  Patient has mild acid reflux and heartburn for which she takes omeprazole on a regular basis. Denies difficulty swallowing, pain on swallowing, early satiety, loss of appetite.   Past Medical History:  Diagnosis Date  . Abnormal Pap smear of cervix    2010-2014 abnormal pap every 3-6 months   dysplastic squamous cells of cervix   . Anxiety    occ.- no meds  . Blood in stool   . Depression    no meds  . Diverticulitis 08/2019  . DUB (dysfunctional uterine bleeding)   . Dyspnea    Resolved - due to anemia - no current problems  . Eczema   . Endometriosis   . Epigastric pain   . Fatigue   . Fibroid   . GERD (gastroesophageal reflux disease)   . Grade I diastolic dysfunction 0000000   Noted on ECHO  . Hemorrhoid    2nd and 3rd degree  . History of adenomatous polyp of colon   . History of palpitations   . HSV infection   . Hyperlipidemia    diet controlled - no meds  . Hypertension    not currently taking medication  . Infertility, female   . Iron deficiency anemia    Last iron infusion 09/14/2018  . Left groin hernia    history of  . Muscle cramping    with exertion  . Recurrent bacterial infection    Vaginal  . Restless leg    due to anemia  . Smoker   . SVD (spontaneous vaginal delivery)  x 2  . Thoracic back pain    while trying to sleep  . Tinnitus, left    hears heart beat, MRI normal  . Vertigo 2018    Past Surgical History:  Procedure Laterality Date  . ABLATION ON ENDOMETRIOSIS  1995  . anemia     due to gyn blood losses from uterine fibroids.  received PRBCs and iron infusions.    . CERVICAL BIOPSY  W/ LOOP ELECTRODE EXCISION    . CERVICAL CONIZATION W/BX N/A 09/20/2018   Procedure: CONIZATION CERVIX WITH BIOPSY;  Surgeon: Nunzio Cobbs, MD;   Location: [facility];  Service: Gynecology;  Laterality: N/A;  . COLONOSCOPY  06/2018   Dr Earlie Raveling.  9 mm sessile, hyperplastic sigmoid polyp.  diverticulosis.  int hemorrhoids.  2 cm cecal lipoma  . COLPOSCOPY     x 3  . CYSTOSCOPY N/A 11/14/2018   Procedure: CYSTOSCOPY;  Surgeon: Nunzio Cobbs, MD;  Location: [facility];  Service: Gynecology;  Laterality: N/A;  . DILATION AND CURETTAGE OF UTERUS  1993  . DILATION AND CURETTAGE OF UTERUS N/A 09/20/2018   Procedure: FRACTIONAL DILATATION AND CURETTAGE;  Surgeon: Nunzio Cobbs, MD;  Location: [facility];  Service: Gynecology;  Laterality: N/A;  . GYNECOLOGIC CRYOSURGERY    . HEMORRHOID BANDING     several  . HERNIA REPAIR Left    childhood  . INTRAUTERINE DEVICE (IUD) INSERTION  2013    (Mirena)  . INTRAUTERINE DEVICE INSERTION  2000   [facility]  . IUD REMOVAL  2010  . IUD REMOVAL  2018  . TOTAL LAPAROSCOPIC HYSTERECTOMY WITH SALPINGECTOMY Bilateral 11/14/2018   Procedure: TOTAL LAPAROSCOPIC HYSTERECTOMY WITH BILATERAL SALPINGECTOMY AND LYSIS OF ADHESIONS;  Surgeon: Nunzio Cobbs, MD;  Location: [facility];  Service: Gynecology;  Laterality: Bilateral;  . TUBAL LIGATION     2010  . UPPER GI ENDOSCOPY  06/2018   x 2  . WISDOM TOOTH EXTRACTION      Prior to Admission medications   Medication Sig Start Date End Date Taking? Authorizing Provider  hydrochlorothiazide (HYDRODIURIL) 25 MG tablet Take 12.5 mg by mouth daily.   Yes [provider]  ibuprofen (ADVIL) 200 MG tablet Take 600-800 mg by mouth every 6 (six) hours as needed for moderate pain.   Yes [provider]  omeprazole (PRILOSEC) 40 MG capsule Take 40 mg by mouth every morning.    Yes [provider]    Current Facility-Administered Medications  Medication Dose Route Frequency Provider Last Rate Last Admin  . 0.9 %  sodium chloride infusion   Intravenous Continuous Jillyn Ledger,  PA-C 125 mL/hr at 10/31/19 0533 New Bag at 10/31/19 0533  . acetaminophen (TYLENOL) tablet 650 mg  650 mg Oral Q6H PRN Jillyn Ledger, PA-C   650 mg at 10/31/19 [id number]   Or  . acetaminophen (TYLENOL) suppository 650 mg  650 mg Rectal Q6H PRN Maczis, Barth Kirks, PA-C      . diphenhydrAMINE (BENADRYL) capsule 25 mg  25 mg Oral Q6H PRN Maczis, Barth Kirks, PA-C       Or  . diphenhydrAMINE (BENADRYL) injection 25 mg  25 mg Intravenous Q6H PRN Maczis, Barth Kirks, PA-C      . enoxaparin (LOVENOX) injection 40 mg  40 mg Subcutaneous Q24H Jillyn Ledger, PA-C   40 mg at 10/30/19 1250  . hydrochlorothiazide (HYDRODIURIL) tablet 12.5 mg  12.5 mg  Oral Daily Jillyn Ledger, PA-C   12.5 mg at 10/31/19 [id number]  . HYDROmorphone (DILAUDID) injection 0.5 mg  0.5 mg Intravenous Q3H PRN Meuth, Brooke A, PA-C      . methocarbamol (ROBAXIN) 500 mg in dextrose 5 % 50 mL IVPB  500 mg Intravenous Q8H Jillyn Ledger, PA-C 100 mL/hr at 10/31/19 0637 500 mg at 10/31/19 0637  . metoprolol tartrate (LOPRESSOR) injection 5 mg  5 mg Intravenous Q6H PRN Maczis, Barth Kirks, PA-C      . ondansetron (ZOFRAN-ODT) disintegrating tablet 4 mg  4 mg Oral Q6H PRN Maczis, Barth Kirks, PA-C       Or  . ondansetron [facility]) injection 4 mg  4 mg Intravenous Q6H PRN Maczis, Barth Kirks, PA-C      . oxyCODONE (Oxy IR/ROXICODONE) immediate release tablet 5-10 mg  5-10 mg Oral Q4H PRN Jillyn Ledger, PA-C   5 mg at 10/31/19 [id number]  . pantoprazole (PROTONIX) injection 40 mg  40 mg Intravenous QHS Jillyn Ledger, PA-C   40 mg at 10/30/19 2134  . piperacillin-tazobactam (ZOSYN) IVPB 3.375 g  3.375 g Intravenous Q8H Jillyn Ledger, PA-C 12.5 mL/hr at 10/31/19 0534 3.375 g at 10/31/19 0534  . sodium chloride flush (NS) 0.9 % injection 3 mL  3 mL Intravenous Once Jillyn Ledger, PA-C        Allergies as of 10/29/2019  . (No Known Allergies)    Family History  Problem Relation Age of Onset  . Cancer Mother 56       Dec.Stage IV colon  ca 10/2013--age 40  . Heart disease Father   . Heart disease Paternal Grandfather   . Other Paternal Grandfather        Dec MVA  . Other Brother        Dec in a MVA age 55  . Alzheimer's disease Maternal Grandfather        Dec  . Other Maternal Grandfather   . Other Paternal Grandmother        Dec MVA  . Heart failure Paternal Aunt        Stage 4 colon cancer    Social History   Socioeconomic History  . Marital status: Married    Spouse name: Not on file  . Number of children: 2  . Years of education: Not on file  . Highest education level: Not on file  Occupational History  . Not on file  Tobacco Use  . Smoking status: Current Every Day Smoker    Packs/day: 0.75    Years: 35.00    Pack years: 26.25    Types: Cigarettes  . Smokeless tobacco: Never Used  Substance and Sexual Activity  . Alcohol use: Yes    Comment: 6-7 drinks a week (alcohol)  Red Bull and Vodka or red wine  . Drug use: Never  . Sexual activity: Yes    Partners: Male    Birth control/protection: Surgical    Comment: BTL  Other Topics Concern  . Not on file  Social History Narrative  . Not on file   Social Determinants of Health   Financial Resource Strain:   . Difficulty of Paying Living Expenses: Not on file  Food Insecurity:   . Worried About Charity fundraiser in the Last Year: Not on file  . Ran Out of Food in the Last Year: Not on file  Transportation Needs:   . Lack of Transportation (Medical): Not on file  .  Lack of Transportation (Non-Medical): Not on file  Physical Activity:   . Days of Exercise per Week: Not on file  . Minutes of Exercise per Session: Not on file  Stress:   . Feeling of Stress : Not on file  Social Connections:   . Frequency of Communication with Friends and Family: Not on file  . Frequency of Social Gatherings with Friends and Family: Not on file  . Attends Religious Services: Not on file  . Active Member of Clubs or Organizations: Not on file  . Attends  Archivist Meetings: Not on file  . Marital Status: Not on file  Intimate Partner Violence:   . Fear of Current or Ex-Partner: Not on file  . Emotionally Abused: Not on file  . Physically Abused: Not on file  . Sexually Abused: Not on file    Review of Systems: Positive for: GI: Described in detail in HPI.    Gen: Denies any fever, chills, rigors, night sweats, anorexia, fatigue, weakness, malaise, involuntary weight loss, and sleep disorder CV: Denies chest pain, angina, palpitations, syncope, orthopnea, PND, peripheral edema, and claudication. Resp: Denies dyspnea, cough, sputum, wheezing, coughing up blood. GU : Denies urinary burning, blood in urine, urinary frequency, urinary hesitancy, nocturnal urination, and urinary incontinence. MS: Left knee and left elbow pain, denies muscle weakness, cramps, atrophy.  Derm: Denies rash, itching, oral ulcerations, hives, unhealing ulcers.  Psych: Denies depression, anxiety, memory loss, suicidal ideation, hallucinations,  and confusion. Heme: Denies bruising, bleeding, and enlarged lymph nodes. Neuro:  Denies any headaches, dizziness, paresthesias. Endo:  Denies any problems with DM, thyroid, adrenal function.  Physical Exam: Vital signs in last 24 hours: Temp:  [98.8 F (37.1 C)-99.5 F (37.5 C)] 99.2 F (37.3 C) (01/05 0342) Pulse Rate:  [78-89] 89 (01/05 0342) Resp:  [18-19] 19 (01/05 0342) BP: (136-142)/(76-81) 136/76 (01/05 0342) SpO2:  [95 %-98 %] 95 % (01/05 0342) Last BM Date: 10/29/19  General:   Alert,  Well-developed, overweight, pleasant and cooperative in NAD Head:  Normocephalic and atraumatic. Eyes:  Sclera clear, no icterus.  Mild pallor Ears:  Normal auditory acuity. Nose:  No deformity, discharge,  or lesions. Mouth:  No deformity or lesions.  Oropharynx pink & moist. Neck:  Supple; no masses or thyromegaly. Lungs:  Clear throughout to auscultation.   No wheezes, crackles, or rhonchi. No acute  distress. Heart:  Regular rate and rhythm; no murmurs, clicks, rubs,  or gallops. Extremities:  Without clubbing or edema. Neurologic:  Alert and  oriented x4;  grossly normal neurologically. Skin:  Intact without significant lesions or rashes. Psych:  Alert and cooperative. Normal mood and affect. Abdomen:  Soft, nontender and nondistended. No masses, hepatosplenomegaly or hernias noted. Normal bowel sounds, without guarding, and without rebound.         Lab Results: Recent Labs    10/29/19 1948 10/31/19 0435  WBC 14.4* 9.3  HGB 11.3* 9.8*  HCT 37.0 32.1*  PLT 403* 316   BMET Recent Labs    10/29/19 1948 10/31/19 0435  NA 135 138  K 4.6 3.7  CL 99 103  CO2 23 24  GLUCOSE 105* 82  BUN 11 6  CREATININE 0.97 0.95  CALCIUM 9.1 8.4*   LFT Recent Labs    10/29/19 1948  PROT 6.9  ALBUMIN 3.8  AST 23  ALT 23  ALKPHOS 80  BILITOT 0.4   PT/INR No results for input(s): LABPROT, INR in the last 72 hours.  Studies/Results: CT  Abdomen Pelvis W Contrast  Result Date: 10/30/2019 CLINICAL DATA:  Lower abdominal pain, primarily right-sided EXAM: CT ABDOMEN AND PELVIS WITH CONTRAST TECHNIQUE: Multidetector CT imaging of the abdomen and pelvis was performed using the standard protocol following bolus administration of intravenous contrast. CONTRAST:  100 mL  OMNIPAQUE IOHEXOL 300 MG/ML  SOLN COMPARISON:  September 11, 2019 FINDINGS: Lower chest: There is atelectatic change in the left base. The lung bases elsewhere are clear. Hepatobiliary: No focal liver lesions are evident. Gallbladder wall is not appreciably thickened. There is no biliary duct dilatation. Pancreas: There is no pancreatic mass or inflammatory focus. Spleen: No splenic lesions are evident. Adrenals/Urinary Tract: There is a 1.1 x 1 cm left adrenal adenoma. There is an apparent adenoma arising from the right adrenal measuring 1.20.9 cm. Adrenals appear stable compared to prior study. Kidneys bilaterally show no evident  mass or hydronephrosis on either side. There is no evident renal or ureteral calculus. Urinary bladder is midline with wall thickness within normal limits. Stomach/Bowel: There is again noted wall thickening in the sigmoid colon with surrounding mesenteric thickening. There is thickening of the terminal ileum in this area, likely due to inflammation arising from the cecum. The nearby appendix appears unchanged with borderline prominence the appendix, likely due to inflammation from the cecum. A small amount of fluid is noted in the lateral conal fascia. A small amount of fluid is noted adjacent to the cecum along its inferolateral aspect. A well-defined abscess is not seen, however. Elsewhere, there are multiple colonic diverticula diffusely without evidence of diverticulitis elsewhere. No evident bowel obstruction. No free air or portal venous air is evident. Vascular/Lymphatic: There is no abdominal aortic aneurysm. There is aortic atherosclerosis. No adenopathy is evident in the abdomen or pelvis. Reproductive: Uterus is absent.  No evident pelvic mass. Other: No evident abscess or ascites in the abdomen or pelvis. Appendix measures 10 mm in thickness which is prominent but similar to the prior study and as noted above is felt to be due to the nearby inflammation from the cecum as opposed to inflammation arising from the appendix itself. There is a small umbilical hernia. Musculoskeletal: No blastic or lytic bone lesions. No intramuscular or abdominal wall lesions are evident. IMPRESSION: 1. There is again noted evidence of diverticulitis in the region of the cecum with associated thickening of the wall of the terminal ileum and appendix. Inflammation appears to arise from the cecum as opposed to arising from the terminal ileum or appendix. The appearance of the appendix is similar to the prior study. There is more terminal ileal inflammation compared to the previous study. There is mild fluid surrounding the  cecum as well as mesenteric thickening surrounding this area in the right lower quadrant. A well-defined abscess is not seen by CT. Note that the persistence of these inflammatory changes involving the cecum and terminal ileum may warrant surgical assessment at this time. 2. Widespread colonic diverticulosis elsewhere without evidence of diverticulitis elsewhere. 3. No bowel obstruction. No well-defined abscess in the abdomen or pelvis. 4. No renal or ureteral calculi. No hydronephrosis. Urinary bladder wall thickness normal. 5.  Apparent small adrenal adenomas bilaterally, stable. 6.  Uterus absent. Electronically Signed   By: Lowella Grip III M.D.   On: 10/30/2019 07:54    Impression: Diverticulitis in the region of cecum with associated thickening of wall of terminal ileum and appendix Similar appearance to CAT scan from November Mild fluid around cecum as well as mesenteric thickening surrounding this area  and right lower quadrant  Widespread colonic diverticulosis Previous history of removal of precancerous polyps Family history of colon cancer Normocytic anemia  Plan: Currently on clear liquid diet and IV Zosyn. Advance diet as tolerated, patient to continue 10 to 14 days of antibiotics(likely switch to oral antibiotic on discharge). Will need a diagnostic colonoscopy as an outpatient in the next 6 to 8 weeks. May require surgery if clinical condition worsens. Recommend obtaining records of prior outpatient colonoscopies(discussed the same with surgical PA). At this point, no additional recommendations from GI standpoint. She has an appointment with Dr.Brahmbhatt on 11/03/2019. Please recall if needed.   LOS: 1 day   Ronnette Juniper, MD  10/31/2019, 1:04 PM

## 2019-11-01 LAB — BASIC METABOLIC PANEL
Anion gap: 7 (ref 5–15)
BUN: 5 mg/dL — ABNORMAL LOW (ref 6–20)
CO2: 27 mmol/L (ref 22–32)
Calcium: 8.4 mg/dL — ABNORMAL LOW (ref 8.9–10.3)
Chloride: 103 mmol/L (ref 98–111)
Creatinine, Ser: 0.85 mg/dL (ref 0.44–1.00)
GFR calc Af Amer: >60 mL/min (ref >60)
GFR calc non Af Amer: >60 mL/min (ref >60)
Glucose, Bld: 93 mg/dL (ref 70–99)
Potassium: 3.8 mmol/L (ref 3.5–5.1)
Sodium: 137 mmol/L (ref 135–145)

## 2019-11-01 LAB — CBC
HCT: 30.4 % — ABNORMAL LOW (ref 36.0–46.0)
Hemoglobin: 9.4 g/dL — ABNORMAL LOW (ref 12.0–15.0)
MCH: 24.8 pg — ABNORMAL LOW (ref 26.0–34.0)
MCHC: 30.9 g/dL (ref 30.0–36.0)
MCV: 80.2 fL (ref 80.0–100.0)
Platelets: 298 10*3/uL (ref 150–400)
RBC: 3.79 MIL/uL — ABNORMAL LOW (ref 3.87–5.11)
RDW: 14.9 % (ref 11.5–15.5)
WBC: 6.1 10*3/uL (ref 4.0–10.5)
nRBC: 0 % (ref 0.0–0.2)

## 2019-11-01 LAB — MAGNESIUM: Magnesium: 2.1 mg/dL (ref 1.7–2.4)

## 2019-11-01 MED ORDER — ONDANSETRON 4 MG PO TBDP: 4.0000 mg | ORAL_TABLET | Freq: Four times a day (QID) | ORAL | 0 refills | Status: DC | PRN

## 2019-11-01 MED ORDER — METRONIDAZOLE 500 MG PO TABS: 500.0000 mg | ORAL_TABLET | Freq: Three times a day (TID) | ORAL | 1 refills | Status: DC

## 2019-11-01 MED ORDER — CIPROFLOXACIN HCL 500 MG PO TABS
500.0000 mg | ORAL_TABLET | Freq: Two times a day (BID) | ORAL | Status: DC
  Administered 2019-11-01: 500 mg via ORAL
  Filled 2019-11-01: qty 1

## 2019-11-01 MED ORDER — ACETAMINOPHEN 325 MG PO TABS: 650.0000 mg | ORAL_TABLET | Freq: Four times a day (QID) | ORAL | Status: AC | PRN

## 2019-11-01 MED ORDER — CIPROFLOXACIN HCL 500 MG PO TABS: 500.0000 mg | ORAL_TABLET | Freq: Two times a day (BID) | ORAL | 1 refills | Status: DC

## 2019-11-01 MED ORDER — METRONIDAZOLE 500 MG PO TABS
500.0000 mg | ORAL_TABLET | Freq: Three times a day (TID) | ORAL | Status: DC
  Administered 2019-11-01 (×2): 500 mg via ORAL
  Filled 2019-11-01 (×2): qty 1

## 2019-11-01 NOTE — Discharge Instructions (Addendum)
Diverticulitis  Diverticulitis is when small pockets in your large intestine (colon) get infected or swollen. This causes stomach pain and watery poop (diarrhea). These pouches are called diverticula. They form in people who have a condition called diverticulosis. Follow these instructions at home: Medicines  Take over-the-counter and prescription medicines only as told by your doctor. These include: ? Antibiotics. ? Pain medicines. ? Fiber pills. ? Probiotics. ? Stool softeners.  Do not drive or use heavy machinery while taking prescription pain medicine.  If you were prescribed an antibiotic, take it as told. Do not stop taking it even if you feel better. General instructions   Follow a diet as told by your doctor.  When you feel better, your doctor may tell you to change your diet. You may need to eat a lot of fiber. Fiber makes it easier to poop (have bowel movements). Healthy foods with fiber include: ? Berries. ? Beans. ? Lentils. ? Green vegetables.  Exercise 3 or more times a week. Aim for 30 minutes each time. Exercise enough to sweat and make your heart beat faster.  Keep all follow-up visits as told. This is important. You may need to have an exam of the large intestine. This is called a colonoscopy. Contact a doctor if:  Your pain does not get better.  You have a hard time eating or drinking.  You are not pooping like normal. Get help right away if:  Your pain gets worse.  Your problems do not get better.  Your problems get worse very fast.  You have a fever.  You throw up (vomit) more than one time.  You have poop that is: ? Bloody. ? Black. ? Tarry. Summary  Diverticulitis is when small pockets in your large intestine (colon) get infected or swollen.  Take medicines only as told by your doctor.  Follow a diet as told by your doctor. This information is not intended to replace advice given to you by your health care provider. Make sure you  discuss any questions you have with your health care provider. Document Revised: 09/24/2017 Document Reviewed: 10/29/2016 Elsevier Patient Education  [place].    Low-Fiber Eating Plan Fiber is found in fruits, vegetables, whole grains, and beans. Eating a diet low in fiber helps to reduce how often you have bowel movements and how much you produce during a bowel movement. A low-fiber eating plan may help your digestive system heal if:  You have certain conditions, such as Crohn's disease or diverticulitis.  You recently had radiation therapy on your pelvis or bowel.  You recently had intestinal surgery.  You have a new surgical opening in your abdomen (colostomy or ileostomy).  Your intestine is narrowed (stricture). Your health care provider will determine how long you need to stay on this diet. Your health care provider may recommend that you work with a diet and nutrition specialist (dietitian). What are tips for following this plan? General guidelines  Follow recommendations from your dietitian about how much fiber you should have each day.  Most people on this eating plan should try to eat less than 10 grams (g) of fiber each day. Your daily fiber goal is _________________ g.  Take vitamin and mineral supplements as told by your health care provider or dietitian. Chewable or liquid forms are best when on this eating plan. Reading food labels  Check food labels for the amount of dietary fiber.  Choose foods that have less than 2 grams of fiber in one  serving. Cooking  Use white flour and other allowed grains for baking and cooking.  Cook meat using methods that keep it tender, such as braising or poaching.  Cook eggs until the yolk is completely solid.  Cook with healthy oils, such as olive oil or canola oil. Meal planning   Eat 5-6 small meals throughout the day instead of 3 large meals.  If you are lactose intolerant: ? Choose low-lactose dairy  foods. ? Do not eat dairy foods, if told by your dietitian.  Limit fat and oils to less than 8 teaspoons a day.  Eat small portions of desserts. What foods are allowed? The items listed below may not be a complete list. Talk with your dietitian about what dietary choices are best for you. Grains All bread and crackers made with white flour. Waffles, pancakes, and [place] toast. Bagels. Pretzels. Melba toast, zwieback, and matzoh. Cooked and dried cereals that do not contain whole grains, added fiber, seeds, or dried fruit. CornmealDomenick Gong. Hot and cold cereals made with refined corn, wheat, rice, or oats. Plain pasta and noodles. White rice. Vegetables Well-cooked or canned vegetables without skin, seeds, or stems. Cooked potatoes without skins. Vegetable juice. Fruits Soft-cooked or canned fruits without skin and seeds. Peeled ripe banana. Applesauce. Fruit juice without pulp. Meats and other protein foods Ground meat. Tender cuts of meat or poultry. Eggs. Fish, seafood, and shellfish. Smooth nut butters. Tofu. Dairy All milk products and drinks. Lactose-free milks, including rice, soy, and almond milks. Yogurt without fruit, nuts, chocolate, or granola mix-ins. Sour cream. Cottage cheese. Cheese. Beverages Decaf coffee. Fruit and vegetable juices or smoothies (in small amounts, with no pulp or skins, and with fruits from allowed list). Sports drinks. Herbal tea. Fats and oils Olive oil, canola oil, sunflower oil, flaxseed oil, and grapeseed oil. Mayonnaise. Cream cheese. Margarine. Butter. Sweets and desserts Plain cakes and cookies. Cream pies and pies made with allowed fruits. Pudding. Custard. Fruit gelatin. Sherbet. Popsicles. Ice cream without nuts. Plain hard candy. Honey. Jelly. Molasses. Syrups, including chocolate syrup. Chocolate. Marshmallows. Gumdrops. Seasoning and other foods Bouillon. Broth. Cream soups made from allowed foods. Strained soup. Casseroles made with allowed  foods. Ketchup. Mild mustard. Mild salad dressings. Plain gravies. Vinegar. Spices in moderation. Salt. Sugar. What foods are not allowed? The items listed below may not be a complete list. Talk with your dietitian about what dietary choices are best for you. Grains Whole wheat and whole grain breads and crackers. Multigrain breads and crackers. Rye bread. Whole grain or multigrain cereals. Cereals with nuts, raisins, or coconut. Bran. Coarse wheat cereals. Granola. High-fiber cereals. Cornmeal or corn bread. Whole grain pasta. Wild or brown rice. Quinoa. Popcorn. Buckwheat. Wheat germ. Vegetables Potato skins. Raw or undercooked vegetables. All beans and bean sprouts. Cooked greens. Corn. Peas. Cabbage. Beets. Broccoli. Brussels sprouts. Cauliflower. Mushrooms. Onions. Peppers. Parsnips. Okra. Sauerkraut. Fruit Raw or dried fruit. Berries. Fruit juice with pulp. Prune juice. Meats and other protein foods Tough, fibrous meats with gristle. Fatty meat. Poultry with skin. Fried meat, Sales executive, or fish. Deli or lunch meats. Sausage, bacon, and hot dogs. Nuts and chunky nut butter. Dried peas, beans, and lentils. Dairy Yogurt with fruit, nuts, chocolate, or granola mix-ins. Beverages Caffeinated coffee and teas. Fats and oils Avocado. Coconut. Sweets and desserts Desserts, cookies, or candies that contain nuts or coconut. Dried fruit. Jams and preserves with seeds. Marmalade. Any dessert made with fruits or grains that are not allowed. Seasoning and other foods Corn tortilla  chips. Soups made with vegetables or grains that are not allowed. Relish. Horseradish. [name] Fava. Olives. Summary  Most people on a low-fiber eating plan should eat less than 10 grams of fiber a day. Follow recommendations from your dietitian about how much fiber you should have each day.  Always check food labels to see the dietary fiber content of packaged foods. In general, a low-fiber food will have fewer than 2 grams of  fiber per serving.  In general, try to avoid whole grains, raw fruits and vegetables, dried fruit, tough cuts of meat, nuts, and seeds.  Take a vitamin and mineral supplement as told by your health care provider or dietitian. This information is not intended to replace advice given to you by your health care provider. Make sure you discuss any questions you have with your health care provider. Document Revised: 02/03/2019 Document Reviewed: 12/15/2016 Elsevier Patient Education  2020 [employer].

## 2019-11-01 NOTE — Progress Notes (Signed)
Subjective No acute events. Feeling much better today. Abd pain has almost resolved. No n/v - tolerating clear liquids. +Flatus, no BM  Objective: Vital signs in last 24 hours: Temp:  [98 F (36.7 C)-98.8 F (37.1 C)] 98.8 F (37.1 C) (01/06 0528) Pulse Rate:  [73-86] 73 (01/06 0528) Resp:  [16-18] 16 (01/06 0528) BP: (128-138)/(63-72) 138/63 (01/06 0528) SpO2:  [97 %-99 %] 97 % (01/06 0528) Last BM Date: 10/29/19  Intake/Output from previous day: 01/05 0701 - 01/06 0700 In: 2874.7 [P.O.:490; I.V.:2086.1; IV Piggyback:298.7] Out: -  Intake/Output this shift: No intake/output data recorded.  Gen: NAD, comfortable, sitting up in chair, dressed, taking clears CV: RRR Pulm: Normal work of breathing Abd: Soft, minimally ttp right side; nondistended Ext: SCDs in place  Lab Results: CBC  Recent Labs    10/31/19 0435 11/01/19 0214  WBC 9.3 6.1  HGB 9.8* 9.4*  HCT 32.1* 30.4*  PLT 316 298   BMET Recent Labs    10/31/19 0435 11/01/19 0214  NA 138 137  K 3.7 3.8  CL 103 103  CO2 24 27  GLUCOSE 82 93  BUN 6 5*  CREATININE 0.95 0.85  CALCIUM 8.4* 8.4*   PT/INR No results for input(s): LABPROT, INR in the last 72 hours. ABG No results for input(s): PHART, HCO3 in the last 72 hours.  Invalid input(s): PCO2, PO2  Studies/Results:  Anti-infectives: Anti-infectives (From admission, onward)   Start     Dose/Rate Route Frequency Ordered Stop   11/01/19 0915  ciprofloxacin (CIPRO) tablet 500 mg     500 mg Oral 2 times daily 11/01/19 0906     11/01/19 0915  metroNIDAZOLE (FLAGYL) tablet 500 mg     500 mg Oral Every 8 hours 11/01/19 0906     10/30/19 1145  piperacillin-tazobactam (ZOSYN) IVPB 3.375 g  Status:  Discontinued     3.375 g 12.5 mL/hr over 240 Minutes Intravenous Every 8 hours 10/30/19 1140 11/01/19 0906       Assessment/Plan: Patient Active Problem List   Diagnosis Date Noted  . Diverticulitis 09/11/2019  . Iron deficiency anemia 09/12/2018  .  Pelvic pain 08/25/2018  . Dyspareunia in female 08/25/2018  . Vertigo 09/15/2017   -Afebrile, clinically improving, WBC remains normal, afebrile -Transition to PO cipro/flagyl -Adv to soft diet -Possible D/C pending toleration of diet, reliable bowel function - later today vs tomorrow -Appreciate GIs assistance in her care - Dr. Alessandra Bevels to see 11/03/2019 and likely outpt diagnostic colonoscopy which will be helpful to evaluate for things such as Crohn's given imaging phenotype vs "standard" diverticulitis   LOS: 2 days   Sharon Mt. Dema Severin, M.D. [facility] Use [email or web address] to contact on call provider

## 2019-11-01 NOTE — Discharge Summary (Signed)
[facility] Discharge Summary   Patient ID: Andrea [name] MRN: [id number] DOB/AGE: 1970-12-01 49 y.o.  Admit date: 10/29/2019 Discharge date: 11/01/2019  Admitting Diagnosis: Diverticulitis   Discharge Diagnosis Patient Active Problem List   Diagnosis Date Noted  . Diverticulitis 09/11/2019  . Iron deficiency anemia 09/12/2018  . Pelvic pain 08/25/2018  . Dyspareunia in female 08/25/2018  . Vertigo 09/15/2017    Consultants Gastroenterology  Imaging: No results found.  Procedures None  Hospital Course:  Andrea [name] is a 49yo female who presented to [facility] 1/4 with abdominal pain. Patient was admitted from 11/16-11/18 for diverticulitis that improved with IV antibiotics and bowel rest.  Her CT scan at that time showed Inflammatory changes centered about diverticula in the cecum and ascending colon most consistent with acute diverticulitis.  This was her first episode of diverticulitis.  She took her outpatient oral antibiotics as prescribed to completion. Since that episode she reports daily episodes of RLQ pain.  ED workup included CT which showed diverticulitis in the region of the cecum with associated thickening of the wall of the terminal ileum and appendix. Patient was admitted for IV zosyn and bowel rest. Gastroenterology was consulted and recommended continuing conservative management; plan for diagnostic colonoscopy as an outpatient in the next 6 to 8 weeks. During admission as the patient's pain improved and leukocytosis normalized diet was advanced as tolerated. On 1/6, the patient was voiding well, tolerating diet, ambulating well, pain well controlled, vital signs stable and felt stable for discharge home.  She was discharged with 7 additional days of cipro/flagyl. Patient will follow up as below and knows to call with questions or concerns.      Allergies as of 11/01/2019   No Known Allergies     Medication List    TAKE these medications    acetaminophen 325 MG tablet Commonly known as: TYLENOL Take 2 tablets (650 mg total) by mouth every 6 (six) hours as needed for mild pain (or temp > 100).   ciprofloxacin 500 MG tablet Commonly known as: CIPRO Take 1 tablet (500 mg total) by mouth 2 (two) times daily.   hydrochlorothiazide 25 MG tablet Commonly known as: HYDRODIURIL Take 12.5 mg by mouth daily.   ibuprofen 200 MG tablet Commonly known as: ADVIL Take 600-800 mg by mouth every 6 (six) hours as needed for moderate pain.   metroNIDAZOLE 500 MG tablet Commonly known as: FLAGYL Take 1 tablet (500 mg total) by mouth every 8 (eight) hours.   omeprazole 40 MG capsule Commonly known as: PRILOSEC Take 40 mg by mouth every morning.   ondansetron 4 MG disintegrating tablet Commonly known as: ZOFRAN-ODT Take 1 tablet (4 mg total) by mouth every 6 (six) hours as needed for nausea.        Follow-up Information    Otis Brace, MD. Go on 11/03/2019.   Specialty: Gastroenterology Why: Follow up as scheduled  Contact information: [place] [address] [phone]        [facility] [place]. Call.   Specialty: General Surgery Why: Call to arrange follow up with either Dr. Dema Severin or Dr. Marcello Moores after your colonoscopy Contact information: [address] [place] [phone]          Signed: [place], [facility] 11/01/2019, 2:52 PM Please see Amion for pager number during day hours 7:00am-4:30pm

## 2019-11-01 NOTE — Plan of Care (Signed)
Pt for discharge going home, alert and oriented, room air, ambulatory, tolerates her soft diet meal, no complain of pain at this time, discontinued peripheral IV line, given health teachings, next appointment, due med explained and understood, given all her personal belongings, waiting for her husband to pick her up.

## 2019-11-07 ENCOUNTER — Telehealth: Payer: Managed Care, Other (non HMO) | Admitting: Oncology

## 2020-05-28 IMAGING — MR MR HEAD WO/W CM
11 of 12 series · 43 of 48 positions shown · IV contrast (multihance)
Comparison: None.

CLINICAL DATA: Vertigo with pulsatile tinnitus in the left ear

EXAM:
MRI HEAD WITHOUT AND WITH CONTRAST
TECHNIQUE: Multiplanar, multiecho pulse sequences of the brain and surrounding
structures were obtained without and with intravenous contrast.
Creatinine was obtained on site at [HOSPITAL] at [HOSPITAL].
Results: Creatinine 0.9 mg/dL.
CONTRAST:  20mL MULTIHANCE GADOBENATE DIMEGLUMINE 529 MG/ML IV SOLN

[Series 5: T1 · sagittal · 4.0mm · 0.72mm/px · 3 of 27 slices shown (1 of 3)]
[im 1/27]
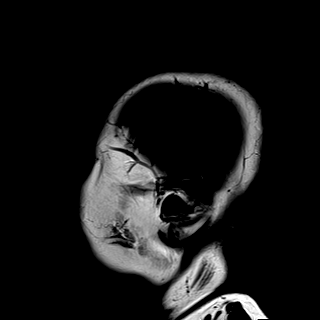
[im 14/27]
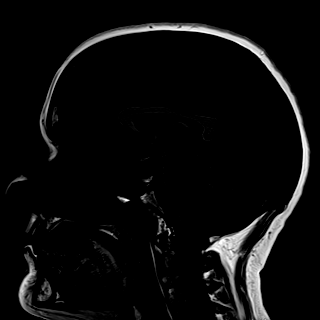
[im 27/27]
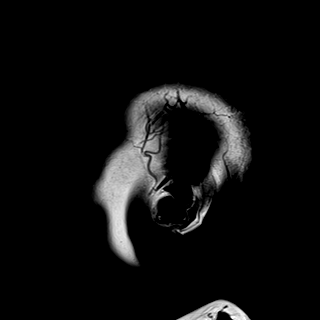

[Series 6: DWI · axial · 3.0mm · 1.44mm/px · z∈[-81,+86]mm · 8 of 88 slices shown (1 of 2)]
[im 1/88]
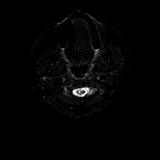
[im 13/88]
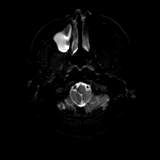
[im 25/88]
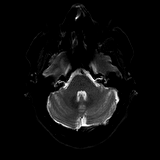
[im 38/88]
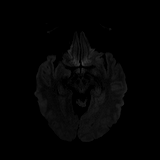
[im 50/88]
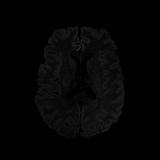
[im 63/88]
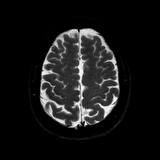
[im 75/88]
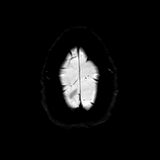
[im 88/88]
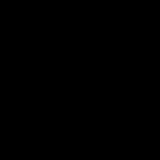

[Series 7: DWI · axial · 3.0mm · 1.44mm/px · z∈[-81,+86]mm · 4 of 44 slices shown (2 of 2)]
[im 1/44]
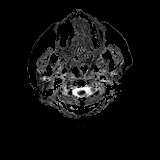
[im 15/44]
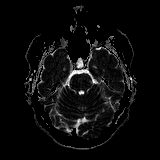
[im 29/44]
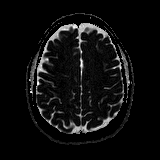
[im 44/44]
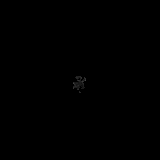

[Series 8: T2 · axial · 4.0mm · 0.36mm/px · z∈[-70,+75]mm · 2 of 29 slices shown]
[im 1/29]
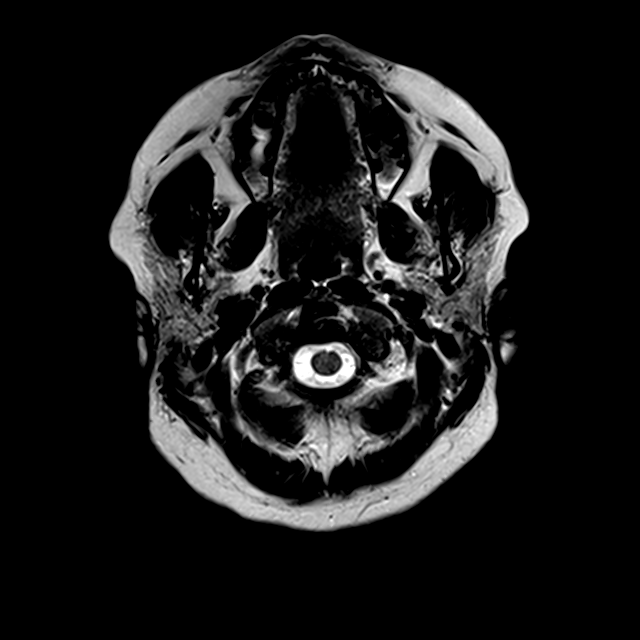
[im 29/29]
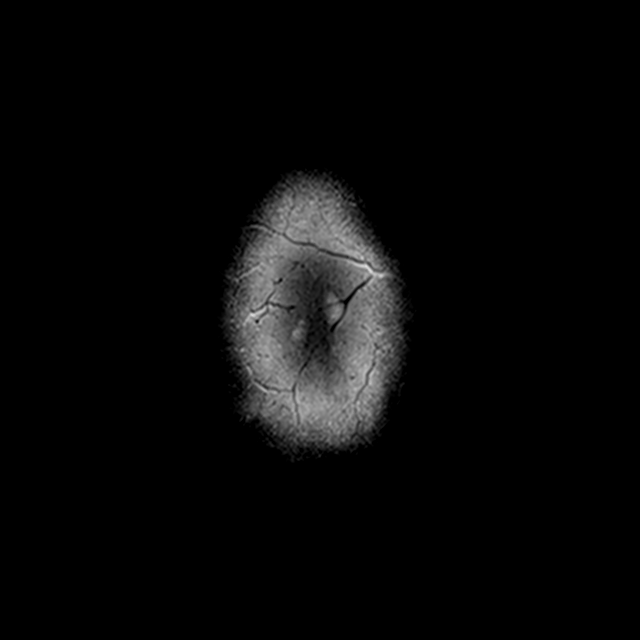

[Series 9: FLAIR · axial · 3.0mm · 0.72mm/px · z∈[-78,+84]mm · 2 of 28 slices shown]
[im 1/28]
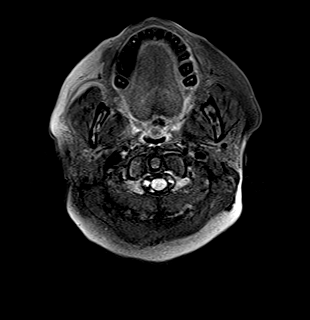
[im 28/28]
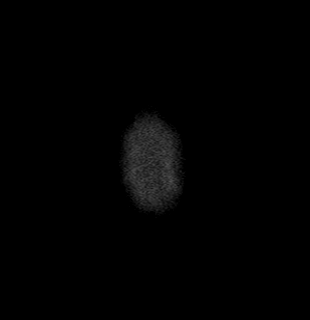

[Series 11: swi_images · axial · 2.0mm · 0.90mm/px · z∈[-76,+81]mm · 7 of 80 slices shown]
[im 1/80]
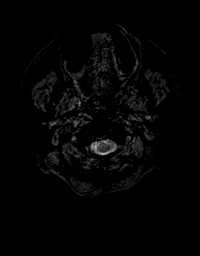
[im 14/80]
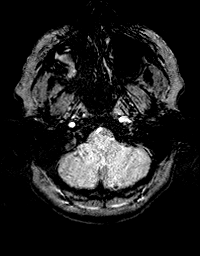
[im 27/80]
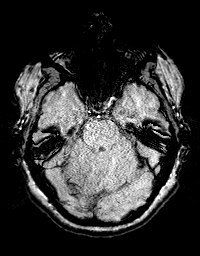
[im 40/80]
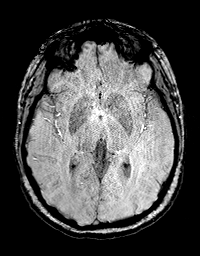
[im 53/80]
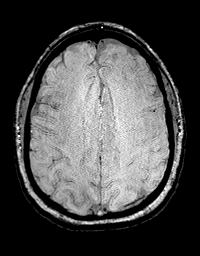
[im 66/80]
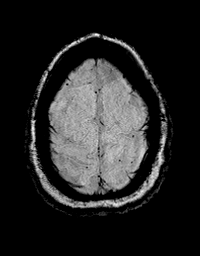
[im 80/80]
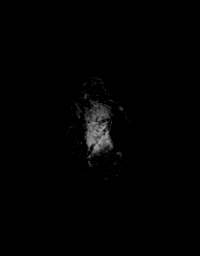

[Series 12: T1 · coronal · 2.5mm · 0.56mm/px · 1 of 13 slices shown (2 of 3)]
[im 1/13]
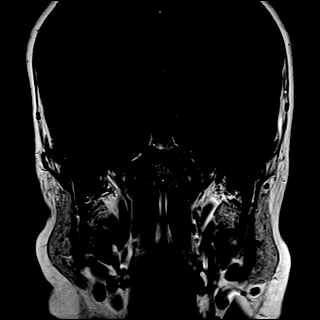

[Series 13: T1 · axial · 2.5mm · 0.50mm/px · 1 of 13 slices shown (3 of 3)]
[im 1/13]
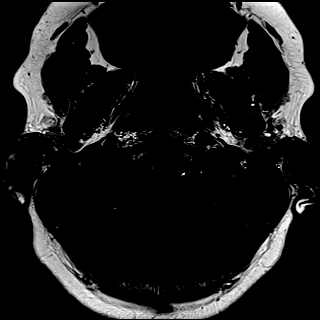

[Series 15: T1 post-contrast · coronal · 2.5mm · 0.56mm/px · 1 of 13 slices shown (1 of 3)]
[im 1/13]
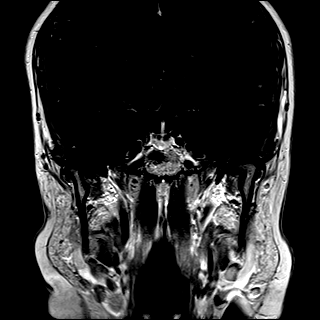

[Series 16: T1 post-contrast · axial · 2.5mm · 0.50mm/px · 1 of 13 slices shown (2 of 3)]
[im 1/13]
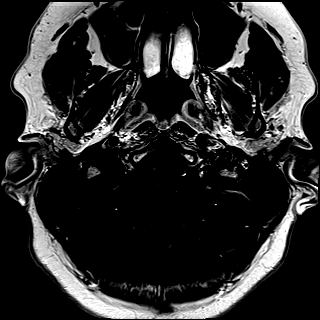

[Series 17: T1 post-contrast · axial · 1.0mm · 0.90mm/px · z∈[-75,+83]mm · 13 of 160 slices shown (3 of 3)]
[im 1/160]
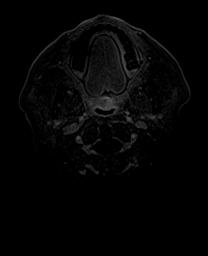
[im 14/160]
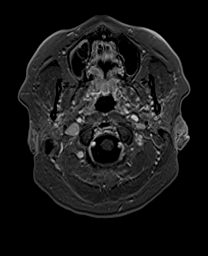
[im 27/160]
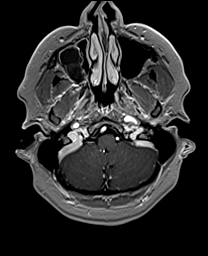
[im 40/160]
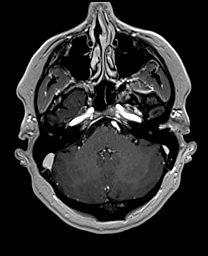
[im 54/160]
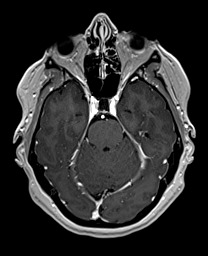
[im 67/160]
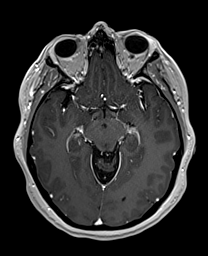
[im 80/160]
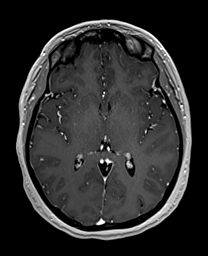
[im 93/160]
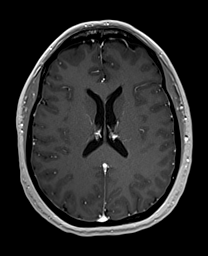
[im 107/160]
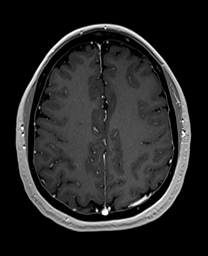
[im 120/160]
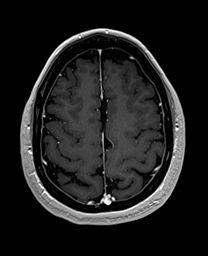
[im 133/160]
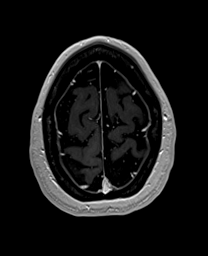
[im 146/160]
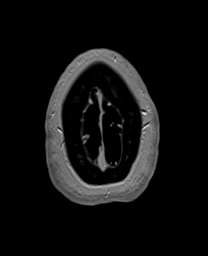
[im 160/160]
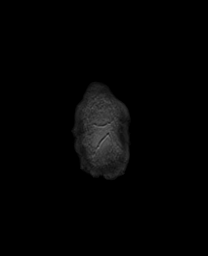

[43 of 48 positions shown; findings below may reference images not displayed]

FINDINGS: Brain: Symmetric normal labyrinthine signal. Clear cisterns and
normal brainstem. Dural venous sinuses show no stenosis, occlusion,
or sigmoid diverticulum. The right-sided sigmoid sinus is mildly
dominant. Present carotid flow voids. Normal appearing course of the
carotids at the skull base. Mastoid and middle ear spaces are clear.

2 or 3 FLAIR hyperintensities in the cerebral white matter from
nonspecific remote insult, commonly seen to this degree in patients
of this age. Brain volume is normal. No infarct, blood products,
hydrocephalus, or mass

Vascular: Major flow voids are preserved, as above

Skull and upper cervical spine: No evidence of marrow lesion

Sinuses/Orbits: Mucous retention cysts in the right maxillary sinus
without fluid level.
IMPRESSION: Negative exam.  No explanation for symptoms.

## 2020-06-18 ENCOUNTER — Telehealth: Payer: Self-pay | Admitting: Obstetrics and Gynecology

## 2020-06-18 NOTE — Telephone Encounter (Signed)
Call returned to patient, advised per Dr. Quincy Simmonds. Patient verbalizes understanding and is agreeable.   Encounter closed.

## 2020-06-18 NOTE — Telephone Encounter (Signed)
I agree with evaluation in ER for potential diverticulitis or UTI.

## 2020-06-18 NOTE — Telephone Encounter (Signed)
Spoke with patient. Patient reports lower left abd/pelvic pain that started on 06/17/20. Patient states "pain is not severe, but not going away". Reports she "doesn't feel well". Symptoms started on 8/23. Report low grade temp, "around 99, typically runs 97". Reports BMs as "normal" Hx of hemorrhoids, bloody stool x1 on 8/23. Patient reports this is not uncommon for her, denies straining or constipation. Denies urinary symptoms. Reports she does void frequently, states this provides some relief of pressure in her abdomen. Hx of diverticulosis and hysterectomy, still has ovaries.   Covid 19 policy reviewed with patient, instructed patient to be seen at local ER/Urgent care for further evaluation of symptoms. If f/u needed, return call to office. Advised I will review with Dr. Quincy Simmonds and return call if any additional recommendations. Patient agreeable.   Routing to Dr. Quincy Simmonds to review.

## 2020-06-18 NOTE — Telephone Encounter (Signed)
Patient is having lower left sided pain and has a low grade fever.

## 2020-07-15 ENCOUNTER — Encounter: Payer: Self-pay | Admitting: Dermatology

## 2020-07-15 ENCOUNTER — Ambulatory Visit: Payer: Managed Care, Other (non HMO) | Admitting: Dermatology

## 2020-07-15 ENCOUNTER — Other Ambulatory Visit: Payer: Self-pay

## 2020-07-15 ENCOUNTER — Other Ambulatory Visit: Payer: Self-pay | Admitting: Dermatology

## 2020-07-15 DIAGNOSIS — Z86018 Personal history of other benign neoplasm: Secondary | ICD-10-CM

## 2020-07-15 DIAGNOSIS — L821 Other seborrheic keratosis: Secondary | ICD-10-CM

## 2020-07-15 DIAGNOSIS — D229 Melanocytic nevi, unspecified: Secondary | ICD-10-CM

## 2020-07-15 DIAGNOSIS — D485 Neoplasm of uncertain behavior of skin: Secondary | ICD-10-CM

## 2020-07-15 DIAGNOSIS — D1739 Benign lipomatous neoplasm of skin and subcutaneous tissue of other sites: Secondary | ICD-10-CM

## 2020-07-15 DIAGNOSIS — L578 Other skin changes due to chronic exposure to nonionizing radiation: Secondary | ICD-10-CM

## 2020-07-15 DIAGNOSIS — Z1283 Encounter for screening for malignant neoplasm of skin: Secondary | ICD-10-CM | POA: Diagnosis not present

## 2020-07-15 DIAGNOSIS — D1722 Benign lipomatous neoplasm of skin and subcutaneous tissue of left arm: Secondary | ICD-10-CM

## 2020-07-15 DIAGNOSIS — D1800 Hemangioma unspecified site: Secondary | ICD-10-CM

## 2020-07-15 NOTE — Patient Instructions (Signed)
Wound Care Instructions  1. Cleanse wound gently with soap and water once a day then pat dry with clean gauze. Apply a thing coat of Mupirocin (Bactroban) over the wound (unless you have an allergy to this). We recommend that you use a new, sterile tube of Mupirocin. Do not pick or remove scabs. Do not remove the yellow or white "healing tissue" from the base of the wound.  2. Cover the wound with fresh, clean, nonstick gauze and secure with paper tape. You may use Band-Aids in place of gauze and tape if the would is small enough, but would recommend trimming much of the tape off as there is often too much. Sometimes Band-Aids can irritate the skin.  3. You should call the office for your biopsy report after 1 week if you have not already been contacted.  4. If you experience any problems, such as abnormal amounts of bleeding, swelling, significant bruising, significant pain, or evidence of infection, please call the office immediately.  5. FOR ADULT SURGERY PATIENTS: If you need something for pain relief you may take 1 extra strength Tylenol (acetaminophen) AND 2 Ibuprofen (200mg each) together every 4 hours as needed for pain. (do not take these if you are allergic to them or if you have a reason you should not take them.) Typically, you may only need pain medication for 1 to 3 days.   

## 2020-07-15 NOTE — Progress Notes (Signed)
Follow-Up Visit   Subjective  Andrea [name] is a 49 y.o. female who presents for the following: Annual Exam (Hx of dysplastic nevus, L upper back 4.0cm lateral to spine). The patient presents for Total-Body Skin Exam (TBSE) for skin cancer screening and mole check.  The following portions of the chart were reviewed this encounter and updated as appropriate:    Review of Systems: No other skin or systemic complaints except as noted in HPI or Assessment and Plan.   Objective  Well appearing patient in no apparent distress; mood and affect are within normal limits.  A full examination was performed including scalp, head, eyes, ears, nose, lips, neck, chest, axillae, abdomen, back, buttocks, bilateral upper extremities, bilateral lower extremities, hands, feet, fingers, toes, fingernails, and toenails. All findings within normal limits unless otherwise noted below.  Objective  L upper back 4.0cm lateral to spine: Scar with no evidence of recurrence.   Objective  Left Shoulder - Posterior: 1 cm fatty tissue  Objective  Right medial breast sup: 1.1 cm flesh colored papule  Objective  Right med breast inferior: 1.2 cm flesh colored papule  Objective  Left posterior Ankle: 0.6 cm flesh colored papule  Assessment & Plan  History of dysplastic nevus L upper back 4.0cm lateral to spine Clear. Observe for recurrence. Call clinic for new or changing lesions.  Recommend regular skin exams, daily broad-spectrum spf 30+ sunscreen use, and photoprotection.    Lipoma of left upper extremity Left Shoulder - Posterior Benign-appearing.  Observation.  Call clinic for new or changing lesions.  Recommend daily use of broad spectrum spf 30+ sunscreen to sun-exposed areas.   Neoplasm of uncertain behavior of skin (3) Right medial breast sup  Epidermal / dermal shaving  Lesion diameter (cm):  1.1 Informed consent: discussed and consent obtained   Timeout: patient name, date of  birth, surgical site, and procedure verified   Procedure prep:  Patient was prepped and draped in usual sterile fashion Prep type:  Isopropyl alcohol Anesthesia: the lesion was anesthetized in a standard fashion   Anesthetic:  1% lidocaine w/ epinephrine 1-100,000 buffered w/ 8.4% NaHCO3 Instrument used: flexible razor blade   Hemostasis achieved with: pressure, aluminum chloride and electrodesiccation   Outcome: patient tolerated procedure well   Post-procedure details: sterile dressing applied and wound care instructions given   Dressing type: bandage and petrolatum    Right med breast inferior  Epidermal / dermal shaving  Lesion diameter (cm):  1.2 Informed consent: discussed and consent obtained   Timeout: patient name, date of birth, surgical site, and procedure verified   Procedure prep:  Patient was prepped and draped in usual sterile fashion Prep type:  Isopropyl alcohol Anesthesia: the lesion was anesthetized in a standard fashion   Anesthetic:  1% lidocaine w/ epinephrine 1-100,000 buffered w/ 8.4% NaHCO3 Instrument used: flexible razor blade   Hemostasis achieved with: pressure, aluminum chloride and electrodesiccation   Outcome: patient tolerated procedure well   Post-procedure details: sterile dressing applied and wound care instructions given   Dressing type: bandage and petrolatum    Left posterior Ankle  Epidermal / dermal shaving  Lesion diameter (cm):  0.6 Informed consent: discussed and consent obtained   Timeout: patient name, date of birth, surgical site, and procedure verified   Procedure prep:  Patient was prepped and draped in usual sterile fashion Prep type:  Isopropyl alcohol Anesthesia: the lesion was anesthetized in a standard fashion   Anesthetic:  1% lidocaine w/ epinephrine 1-100,000  buffered w/ 8.4% NaHCO3 Instrument used: flexible razor blade   Hemostasis achieved with: pressure, aluminum chloride and electrodesiccation   Outcome: patient  tolerated procedure well   Post-procedure details: sterile dressing applied and wound care instructions given   Dressing type: bandage and petrolatum    Other Related Procedures Pathology ([facility])  Skin cancer screening  Seborrheic Keratoses - Stuck-on, waxy, tan-brown papules and plaques  - Discussed benign etiology and prognosis. - Observe - Call for any changes  Melanocytic Nevi - Tan-brown and/or pink-flesh-colored symmetric macules and papules - Benign appearing on exam today - Observation - Call clinic for new or changing moles - Recommend daily use of broad spectrum spf 30+ sunscreen to sun-exposed areas.   Hemangiomas - Red papules - Discussed benign nature - Observe - Call for any changes  Actinic Damage - diffuse scaly erythematous macules with underlying dyspigmentation - Recommend daily broad spectrum sunscreen SPF 30+ to sun-exposed areas, reapply every 2 hours as needed.  - Call for new or changing lesions.  Skin cancer screening performed today.  Return in about 1 year (around 07/15/2021) for TBSE.   IHarriett Sine, CMA, am acting as scribe for Sarina Ser, MD.  Documentation: I have reviewed the above documentation for accuracy and completeness, and I agree with the above.  Sarina Ser, MD

## 2020-07-22 LAB — ANATOMIC PATHOLOGY REPORT

## 2020-07-23 ENCOUNTER — Encounter: Payer: Self-pay | Admitting: Dermatology

## 2020-07-29 ENCOUNTER — Telehealth: Payer: Self-pay

## 2020-07-29 NOTE — Telephone Encounter (Signed)
Patient advised all 3 biopsies benign, JS

## 2020-07-29 NOTE — Telephone Encounter (Signed)
-----   Message from Ralene Bathe, MD sent at 07/29/2020 11:40 AM EDT ----- Diagnosis synopsis: Comment  Comment: Specimen 1-Skin Biopsy, Right Medial Breast Superior:  SEBORRHEIC KERATOSIS, INFLAMED.  Specimen 2-Skin Biopsy, Right Medial Breast Inferior:  BENIGN MELANOCYTIC NEVUS, PREDOMINANTLY INTRADERMAL TYPE.  Specimen 3-Skin Biopsy, Left Posterior Ankle: SURFACE OF  DERMATOFIBROMA (BENIGN FIBROUS HISTIOCYTOMA).  1- benign inflamed keratosis No further treatment needed 2- benign mole No further treatment needed 3- benign dermatofibroma May recur No further treatment needed  Recheck at next visit

## 2020-09-02 ENCOUNTER — Encounter: Payer: Self-pay | Admitting: Cardiovascular Disease

## 2020-09-24 ENCOUNTER — Other Ambulatory Visit: Payer: Self-pay | Admitting: Internal Medicine

## 2020-09-24 ENCOUNTER — Ambulatory Visit: Payer: Managed Care, Other (non HMO) | Admitting: Obstetrics and Gynecology

## 2020-09-24 DIAGNOSIS — F17211 Nicotine dependence, cigarettes, in remission: Secondary | ICD-10-CM

## 2020-09-30 ENCOUNTER — Other Ambulatory Visit: Payer: Self-pay | Admitting: Internal Medicine

## 2020-09-30 DIAGNOSIS — Z1231 Encounter for screening mammogram for malignant neoplasm of breast: Secondary | ICD-10-CM

## 2020-10-01 ENCOUNTER — Ambulatory Visit
Admission: RE | Admit: 2020-10-01 | Discharge: 2020-10-01 | Disposition: A | Discharge status: Home / Self Care | Payer: Managed Care, Other (non HMO) | Source: Ambulatory Visit | Attending: Internal Medicine | Admitting: Internal Medicine

## 2020-10-01 ENCOUNTER — Other Ambulatory Visit: Payer: Self-pay

## 2020-10-01 DIAGNOSIS — Z1231 Encounter for screening mammogram for malignant neoplasm of breast: Secondary | ICD-10-CM

## 2020-10-07 ENCOUNTER — Other Ambulatory Visit: Payer: Self-pay | Admitting: Internal Medicine

## 2020-10-07 DIAGNOSIS — R928 Other abnormal and inconclusive findings on diagnostic imaging of breast: Secondary | ICD-10-CM

## 2020-10-14 ENCOUNTER — Ambulatory Visit: Payer: Managed Care, Other (non HMO)

## 2020-10-14 ENCOUNTER — Ambulatory Visit
Admission: RE | Admit: 2020-10-14 | Discharge: 2020-10-14 | Disposition: A | Discharge status: Home / Self Care | Payer: Managed Care, Other (non HMO) | Source: Ambulatory Visit | Attending: Internal Medicine | Admitting: Internal Medicine

## 2020-10-14 ENCOUNTER — Other Ambulatory Visit: Payer: Self-pay

## 2020-10-14 DIAGNOSIS — R928 Other abnormal and inconclusive findings on diagnostic imaging of breast: Secondary | ICD-10-CM

## 2020-12-17 ENCOUNTER — Ambulatory Visit: Payer: Managed Care, Other (non HMO) | Admitting: Obstetrics and Gynecology

## 2020-12-18 ENCOUNTER — Encounter: Payer: Self-pay | Admitting: Nurse Practitioner

## 2020-12-18 ENCOUNTER — Ambulatory Visit (INDEPENDENT_AMBULATORY_CARE_PROVIDER_SITE_OTHER): Payer: Managed Care, Other (non HMO) | Admitting: Nurse Practitioner

## 2020-12-18 ENCOUNTER — Other Ambulatory Visit: Payer: Self-pay

## 2020-12-18 VITALS — BP 138/90 | HR 78 | Resp 14 | Ht 68.0 in | Wt 227.0 lb

## 2020-12-18 DIAGNOSIS — Z90710 Acquired absence of both cervix and uterus: Secondary | ICD-10-CM | POA: Insufficient documentation

## 2020-12-18 DIAGNOSIS — Z1272 Encounter for screening for malignant neoplasm of vagina: Secondary | ICD-10-CM | POA: Diagnosis not present

## 2020-12-18 DIAGNOSIS — Z01419 Encounter for gynecological examination (general) (routine) without abnormal findings: Secondary | ICD-10-CM | POA: Diagnosis not present

## 2020-12-18 NOTE — Progress Notes (Signed)
   Andrea [name] [facility] [birth year] [id number]   History:  50 y.o. [id number] presents for annual exam. 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 No HRT. Is starting to have occasional night sweats. 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  Normal mammogram history. Smoker.   Gynecologic History No LMP recorded (lmp unknown). Patient has had a hysterectomy.   Contraception/Family planning: status post hysterectomy  Health Maintenance Last Pap: 09/20/2019. Results were: normal Last mammogram: 10/14/2020. Results were: normal Last colonoscopy: 2021 Last Dexa: Never  Past medical history, past surgical history, family history and social history were all reviewed and documented in the EPIC chart. Mother deceased from colon cancer.   ROS:  A ROS was performed and pertinent positives and negatives are included.  Exam:  Vitals:   12/18/20 1505  BP: 138/90  Pulse: 78  Resp: 14  Weight: 227 lb (103 kg)  Height: 5\' 8"  (1.727 m)   Body mass index is 34.52 kg/m.  General appearance:  Normal Thyroid:  Symmetrical, normal in size, without palpable masses or nodularity. Respiratory  Auscultation:  Clear without wheezing or rhonchi Cardiovascular  Auscultation:  Regular rate, without rubs, murmurs or gallops  Edema/varicosities:  Not grossly evident Abdominal  Soft,nontender, without masses, guarding or rebound.  Liver/spleen:  No organomegaly noted  Hernia:  None appreciated  Skin  Inspection:  Grossly normal   Breasts: Examined lying and sitting.   Right: Without masses, retractions, discharge or axillary adenopathy.   Left: Without masses, retractions, discharge or axillary adenopathy. Gentitourinary   Inguinal/mons:  Normal without inguinal adenopathy  External genitalia:  Normal  BUS/Urethra/Skene's glands:  Normal  Vagina:  Normal  Cervix:  Absent  Uterus:   Absent  Adnexa/parametria:     Rt: Without masses or tenderness.   Lt: Without masses or tenderness.  Anus and perineum: Normal  Digital rectal exam: Declines d/t hemorrhoids  Assessment/Plan:  50 y.o. [id number] for annual exam.   Well female exam with routine gynecological exam - Education provided on SBEs, importance of preventative screenings, current guidelines, high calcium diet, regular exercise, and multivitamin daily. Labs with PCP.   History of total hysterectomy - 2020 with bilateral salpingectomies for CIN 2, fibroids, anemia s/t menorrhagia. No HRT. Complains of occasional night sweats. OTC supplements discussed if symptoms progress.   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 Pap with reflex today of vaginal cuff. Will repeat next year and if both normal we will switch to 3-year screenings per guidelines.   Screening for breast cancer - Normal mammogram history.  Continue annual screenings.  Normal breast exam today.  Screening for colon cancer - 2021 colonoscopy with 5-year repeat recommended. Mother deceased from colon cancer. Patient with history of bleeding hemorrhoids and diverticulitis.  Follow up in 1 year for annual.     Andrea [name] [facility], 3:22 PM 12/18/2020

## 2020-12-18 NOTE — Addendum Note (Signed)
Addended by: Karmen Bongo T on: 12/18/2020 03:51 PM   Modules accepted: Orders

## 2020-12-18 NOTE — Patient Instructions (Addendum)
Ginseng, Devra Dopp, OR Black cohosh  Health Maintenance, Female Adopting a healthy lifestyle and getting preventive care are important in promoting health and wellness. Ask your health care provider about:  The right schedule for you to have regular tests and exams.  Things you can do on your own to prevent diseases and keep yourself healthy. What should I know about diet, weight, and exercise? Eat a healthy diet  Eat a diet that includes plenty of vegetables, fruits, low-fat dairy products, and lean protein.  Do not eat a lot of foods that are high in solid fats, added sugars, or sodium.   Maintain a healthy weight Body mass index (BMI) is used to identify weight problems. It estimates body fat based on height and weight. Your health care provider can help determine your BMI and help you achieve or maintain a healthy weight. Get regular exercise Get regular exercise. This is one of the most important things you can do for your health. Most adults should:  Exercise for at least 150 minutes each week. The exercise should increase your heart rate and make you sweat (moderate-intensity exercise).  Do strengthening exercises at least twice a week. This is in addition to the moderate-intensity exercise.  Spend less time sitting. Even light physical activity can be beneficial. Watch cholesterol and blood lipids Have your blood tested for lipids and cholesterol at 52 years of age, then have this test every 5 years. Have your cholesterol levels checked more often if:  Your lipid or cholesterol levels are high.  You are older than 52 years of age.  You are at high risk for heart disease. What should I know about cancer screening? Depending on your health history and family history, you may need to have cancer screening at various ages. This may include screening for:  Breast cancer.  Cervical cancer.  Colorectal cancer.  Skin cancer.  Lung cancer. What should I know about  heart disease, diabetes, and high blood pressure? Blood pressure and heart disease  High blood pressure causes heart disease and increases the risk of stroke. This is more likely to develop in people who have high blood pressure readings, are of African descent, or are overweight.  Have your blood pressure checked: ? Every 3-5 years if you are 37-19 years of age. ? Every year if you are 52 years old or older. Diabetes Have regular diabetes screenings. This checks your fasting blood sugar level. Have the screening done:  Once every three years after age 52 if you are at a normal weight and have a low risk for diabetes.  More often and at a younger age if you are overweight or have a high risk for diabetes. What should I know about preventing infection? Hepatitis B If you have a higher risk for hepatitis B, you should be screened for this virus. Talk with your health care provider to find out if you are at risk for hepatitis B infection. Hepatitis C Testing is recommended for:  Everyone born from [birth year] through 1967.  Anyone with known risk factors for hepatitis C. Sexually transmitted infections (STIs)  Get screened for STIs, including gonorrhea and chlamydia, if: ? You are sexually active and are younger than 52 years of age. ? You are older than 52 years of age and your health care provider tells you that you are at risk for this type of infection. ? Your sexual activity has changed since you were last screened, and you are at increased risk for chlamydia  or gonorrhea. Ask your health care provider if you are at risk.  Ask your health care provider about whether you are at high risk for HIV. Your health care provider may recommend a prescription medicine to help prevent HIV infection. If you choose to take medicine to prevent HIV, you should first get tested for HIV. You should then be tested every 3 months for as long as you are taking the medicine. Pregnancy  If you are about to  stop having your period (premenopausal) and you may become pregnant, seek counseling before you get pregnant.  Take 400 to 800 micrograms (mcg) of folic acid every day if you become pregnant.  Ask for birth control (contraception) if you want to prevent pregnancy. Osteoporosis and menopause Osteoporosis is a disease in which the bones lose minerals and strength with aging. This can result in bone fractures. If you are 67 years old or older, or if you are at risk for osteoporosis and fractures, ask your health care provider if you should:  Be screened for bone loss.  Take a calcium or vitamin D supplement to lower your risk of fractures.  Be given hormone replacement therapy (HRT) to treat symptoms of menopause. Follow these instructions at home: Lifestyle  Do not use any products that contain nicotine or tobacco, such as cigarettes, e-cigarettes, and chewing tobacco. If you need help quitting, ask your health care provider.  Do not use street drugs.  Do not share needles.  Ask your health care provider for help if you need support or information about quitting drugs. Alcohol use  Do not drink alcohol if: ? Your health care provider tells you not to drink. ? You are pregnant, may be pregnant, or are planning to become pregnant.  If you drink alcohol: ? Limit how much you use to 0-1 drink a day. ? Limit intake if you are breastfeeding.  Be aware of how much alcohol is in your drink. In the [place] one drink equals one 12 oz bottle of beer (355 mL), one 5 oz glass of wine (148 mL), or one 1 oz glass of hard liquor (44 mL). General instructions  Schedule regular health, dental, and eye exams.  Stay current with your vaccines.  Tell your health care provider if: ? You often feel depressed. ? You have ever been abused or do not feel safe at home. Summary  Adopting a healthy lifestyle and getting preventive care are important in promoting health and wellness.  Follow your  health care provider's instructions about healthy diet, exercising, and getting tested or screened for diseases.  Follow your health care provider's instructions on monitoring your cholesterol and blood pressure. This information is not intended to replace advice given to you by your health care provider. Make sure you discuss any questions you have with your health care provider. Document Revised: 10/05/2018 Document Reviewed: 10/05/2018 Elsevier Patient Education  2021 [employer].

## 2020-12-23 LAB — PAP IG W/ RFLX HPV ASCU

## 2021-01-23 ENCOUNTER — Telehealth: Payer: Self-pay

## 2021-01-23 NOTE — Telephone Encounter (Signed)
NOTES ON FILE FROM DR Jilda Panda [phone], SENT REFERRAL TO SCHEDULING

## 2021-02-04 NOTE — Progress Notes (Signed)
Cardiology Office Note:    Date:  02/05/2021   ID:  Andrea [name], Andrea [name] 17-May-1971, MRN [id number]  PCP:  Jilda Panda, MD  Cardiologist:  Mertie Moores, MD    Referring MD: Jilda Panda, MD   Chief Complaint  Patient presents with  . Hypertension  . Chest Pain   Problem List   1. Essential hypertension History of chest pain Dizziness   History of Present Illness:    Andrea [name] is a 50 y.o. female with a hx of Hypertension who presents today for episodes of chest tightness and dizziness.  Andrea [name] is seen for the first time today  She has had some symptoms of diaphoresis on occasion.   Has lightheadedness - has been diagnosed with benign positional vertigo . Occurs randomly  - does not occur regularly . Last year she was exercising more and had lots of dizziness Had stopped smoking , lost 35 lbs But relapsed, restarted smoking in Feb.    Has some episodes of chest tightness.   Has been diagnosied with HTN  - tried [place] - HCT but she had hypotension.     Has   Has been on phentermine for about a year.   September 15, 2017:  Andrea [name] is seen back today for follow-up visit. She was seen several months ago with some chest pain.  A coronary calcium score is 0. Coronary CT angiogram was normal. She was also having some palpitations that were probably related to phentermine. Has had these palps for years.  Has worn a monitir . Still has fleeting pains.  Still has vertigo .  Has been to ENT.   Needs to go again  BP is elevated  Her primary MD has started her on a BP med but she has not picked it up Still smoking  Not exercising yet  Echocardiogram showed normal left ventricular systolic function with EF of 60-65%.  She has grade 1 diastolic dysfunction.  She has trivial aortic insufficiency.  February 05, 2021:  Andrea [name] is seen today for follow up of her CP. Coronary calcium score was 0 . Has some palpitations - which were likely due to phentermine.  Still  smoking  - we discussed at length   Has some intermittant gas pain   Has been exercising ( [employer])  Lost 20 lbs . But she still smokes.  Gets lightheaded toward the end of the boot camp.  She is noticed that her heart rate seems to be a little bit fast.  Even when she is out walking in the park with her husband her Fitbit recorded a heart rate of over 200.  HR will occasionaly spike up to 130s even while she is sitting  Seems to occur after eating    Past Medical History:  Diagnosis Date  . Abnormal Pap smear of cervix    2010-2014 abnormal pap every 3-6 months   dysplastic squamous cells of cervix   . Anxiety    occ.- no meds  . Blood in stool   . Depression    no meds  . Diverticulitis 08/2019  . DUB (dysfunctional uterine bleeding)   . Dyslipidemia   . Dyspnea    Resolved - due to anemia - no current problems  . Eczema   . Endometriosis   . Epigastric pain   . Fatigue   . Fibroid   . GERD (gastroesophageal reflux disease)   . Grade I diastolic dysfunction 51/88/4166   Noted on ECHO  . Hemorrhoid  2nd and 3rd degree  . History of adenomatous polyp of colon   . History of palpitations   . HSV infection   . Hx of dysplastic nevus 07/17/2019   L upper back 4.0cm lateral to spine, moderate  . Hyperlipidemia    diet controlled - no meds  . Hypertension    not currently taking medication  . Infertility, female   . Iron deficiency anemia    Last iron infusion 09/14/2018  . Left groin hernia    history of  . Muscle cramping    with exertion  . Obesity   . Palpitations   . Recurrent bacterial infection    Vaginal  . Restless leg    due to anemia  . Smoker   . SVD (spontaneous vaginal delivery)    x 2  . Thoracic back pain    while trying to sleep  . Tinnitus, left    hears heart beat, MRI normal  . Vertigo 2018    Past Surgical History:  Procedure Laterality Date  . ABLATION ON ENDOMETRIOSIS  1995  . anemia     due to gyn blood losses from  uterine fibroids.  received PRBCs and iron infusions.    . CERVICAL BIOPSY  W/ LOOP ELECTRODE EXCISION    . CERVICAL CONIZATION W/BX N/A 09/20/2018   Procedure: CONIZATION CERVIX WITH BIOPSY;  Surgeon: Nunzio Cobbs, MD;  Location[facility];  Service: Gynecology;  Laterality: N/A;  . COLONOSCOPY  06/2018   Dr Earlie Raveling.  9 mm sessile, hyperplastic sigmoid polyp.  diverticulosis.  int hemorrhoids.  2 cm cecal lipoma  . COLPOSCOPY     x 3  . CYSTOSCOPY N/A 11/14/2018   Procedure: CYSTOSCOPY;  Surgeon: Nunzio Cobbs, MD;  Location: [facility];  Service: Gynecology;  Laterality: N/A;  . DILATION AND CURETTAGE OF UTERUS  1993  . DILATION AND CURETTAGE OF UTERUS N/A 09/20/2018   Procedure: FRACTIONAL DILATATION AND CURETTAGE;  Surgeon: Nunzio Cobbs, MD;  Location: [facility];  Service: Gynecology;  Laterality: N/A;  . GYNECOLOGIC CRYOSURGERY    . HEMORRHOID BANDING     several  . HERNIA REPAIR Left    childhood  . INTRAUTERINE DEVICE (IUD) INSERTION  2013    (Mirena)  . INTRAUTERINE DEVICE INSERTION  2000   [place]  . IUD REMOVAL  2010  . IUD REMOVAL  2018  . TOTAL LAPAROSCOPIC HYSTERECTOMY WITH SALPINGECTOMY Bilateral 11/14/2018   Procedure: TOTAL LAPAROSCOPIC HYSTERECTOMY WITH BILATERAL SALPINGECTOMY AND LYSIS OF ADHESIONS;  Surgeon: Nunzio Cobbs, MD;  Location: [facility];  Service: Gynecology;  Laterality: Bilateral;  . TUBAL LIGATION     2010  . UPPER GI ENDOSCOPY  06/2018   x 2  . WISDOM TOOTH EXTRACTION      Current Medications: Current Meds  Medication Sig  . acetaminophen (TYLENOL) 325 MG tablet Take 2 tablets (650 mg total) by mouth every 6 (six) hours as needed for mild pain (or temp > 100).  . hydrochlorothiazide (HYDRODIURIL) 25 MG tablet Take 25 mg by mouth as needed.  . hydrocortisone (ANUSOL-HC) 2.5 % rectal cream APPLY RECTALLY TWICE A DAY  . ibuprofen (ADVIL) 200 MG tablet Take 600-800 mg by  mouth every 6 (six) hours as needed for moderate pain.  . metoprolol tartrate (LOPRESSOR) 25 MG tablet Take 1 tablet (25 mg total) by mouth 2 (two) times daily.  Marland Kitchen omeprazole (PRILOSEC) 40 MG  capsule Take 40 mg by mouth every morning.     Allergies:   Patient has no known allergies.   Social History   Socioeconomic History  . Marital status: Married    Spouse name: Not on file  . Number of children: 2  . Years of education: Not on file  . Highest education level: Not on file  Occupational History  . Not on file  Tobacco Use  . Smoking status: Current Every Day Smoker    Packs/day: 0.75    Years: 35.00    Pack years: 26.25    Types: Cigarettes  . Smokeless tobacco: Never Used  Vaping Use  . Vaping Use: Never used  Substance and Sexual Activity  . Alcohol use: Yes    Comment: 6-7 drinks a week (alcohol)  Red Bull and Vodka or red wine  . Drug use: Never  . Sexual activity: Yes    Partners: Male    Birth control/protection: Surgical    Comment: BTL  Other Topics Concern  . Not on file  Social History Narrative  . Not on file   Social Determinants of Health   Financial Resource Strain: Not on file  Food Insecurity: Not on file  Transportation Needs: Not on file  Physical Activity: Not on file  Stress: Not on file  Social Connections: Not on file     Family History: The patient's family history includes Alzheimer's disease in her maternal grandfather; Cancer (age of onset: 28) in her mother; Heart disease in her father and paternal grandfather; Heart failure in her paternal aunt; Other in her brother, maternal grandfather, paternal grandfather, and paternal grandmother. ROS:   Please see the history of present illness.     All other systems reviewed and are negative.  EKGs/Labs/Other Studies Reviewed:    The following studies were reviewed today:   EKG:    Recent Labs: 02/05/2021: BUN 17; Creatinine, Ser 0.94; Hemoglobin 13.0; Platelets 251; Potassium 5.4;  Sodium 140; TSH 1.150  Recent Lipid Panel No results found for: CHOL, TRIG, HDL, CHOLHDL, VLDL, LDLCALC, LDLDIRECT  Physical Exam: Blood pressure (!) 136/92, pulse 75, height 5\' 8"  (1.727 m), weight 229 lb 9.6 oz (104.1 kg), SpO2 97 %.  GEN:  Middle age female,  Moderately obese  HEENT: Normal NECK: No JVD; No carotid bruits LYMPHATICS: No lymphadenopathy CARDIAC: RRR  ,  Soft systolic murmur  RESPIRATORY:  Clear to auscultation without rales, wheezing or rhonchi  ABDOMEN: Soft, non-tender, non-distended MUSCULOSKELETAL:  No edema; No deformity  SKIN: Warm and dry NEUROLOGIC:  Alert and oriented x 3   ASSESSMENT:    1. Palpitations    PLAN:       1.   Chest tightness:    She had a normal coronary CT angiogram.  Coronary calcium score is 0.  Reassured her that her heart looks great. She is no longer having any cp   2.  Palpitations: Andrea [name] presents with episodes of palpitations.  These typically occur while she is working out but occasionally she has them at rest.  She admits to not drinking enough water fluids.  I have encouraged her to hydrate better.  Have given her the names of liquid IV or nuun tablets to add to her water.  We will place a live event monitor on her for 14 days. We will check a basic metabolic profile, TSH, CBC.  3. Palpitations:       Medication Adjustments/Labs and Tests Ordered: Current medicines are reviewed at length  with the patient today.  Concerns regarding medicines are outlined above.  Orders Placed This Encounter  Procedures  . Basic metabolic panel  . TSH  . CBC  . EKG 12-Lead   Meds ordered this encounter  Medications  . metoprolol tartrate (LOPRESSOR) 25 MG tablet    Sig: Take 1 tablet (25 mg total) by mouth 2 (two) times daily.    Dispense:  180 tablet    Refill:  3      Signed, Mertie Moores, MD  02/05/2021 5:36 PM    [place]

## 2021-02-05 ENCOUNTER — Other Ambulatory Visit: Payer: Self-pay | Admitting: Cardiovascular Disease

## 2021-02-05 ENCOUNTER — Other Ambulatory Visit: Payer: Self-pay

## 2021-02-05 ENCOUNTER — Encounter: Payer: Self-pay | Admitting: Cardiovascular Disease

## 2021-02-05 ENCOUNTER — Ambulatory Visit: Payer: Managed Care, Other (non HMO) | Admitting: Cardiovascular Disease

## 2021-02-05 ENCOUNTER — Ambulatory Visit (INDEPENDENT_AMBULATORY_CARE_PROVIDER_SITE_OTHER): Payer: Managed Care, Other (non HMO)

## 2021-02-05 ENCOUNTER — Encounter: Payer: Self-pay | Admitting: *Deleted

## 2021-02-05 VITALS — BP 136/92 | HR 75 | Ht 68.0 in | Wt 229.6 lb

## 2021-02-05 DIAGNOSIS — R42 Dizziness and giddiness: Secondary | ICD-10-CM

## 2021-02-05 DIAGNOSIS — R002 Palpitations: Secondary | ICD-10-CM

## 2021-02-05 LAB — CBC
Hematocrit: 39.3 % (ref 34.0–46.6)
Hemoglobin: 13 g/dL (ref 11.1–15.9)
MCH: 28 pg (ref 26.6–33.0)
MCHC: 33.1 g/dL (ref 31.5–35.7)
MCV: 85 fL (ref 79–97)
Platelets: 251 10*3/uL (ref 150–450)
RBC: 4.64 x10E6/uL (ref 3.77–5.28)
RDW: 14.6 % (ref 11.7–15.4)
WBC: 6.2 10*3/uL (ref 3.4–10.8)

## 2021-02-05 LAB — BASIC METABOLIC PANEL
BUN/Creatinine Ratio: 18 (ref 9–23)
BUN: 17 mg/dL (ref 6–24)
CO2: 26 mmol/L (ref 20–29)
Calcium: 9.9 mg/dL (ref 8.7–10.2)
Chloride: 101 mmol/L (ref 96–106)
Creatinine, Ser: 0.94 mg/dL (ref 0.57–1.00)
Glucose: 90 mg/dL (ref 65–99)
Potassium: 5.4 mmol/L — ABNORMAL HIGH (ref 3.5–5.2)
Sodium: 140 mmol/L (ref 134–144)
eGFR: 73 mL/min/{1.73_m2} (ref >59)

## 2021-02-05 LAB — TSH: TSH: 1.15 u[IU]/mL (ref 0.450–4.500)

## 2021-02-05 MED ORDER — METOPROLOL TARTRATE 25 MG PO TABS: 25.0000 mg | ORAL_TABLET | Freq: Two times a day (BID) | ORAL | 3 refills | Status: DC

## 2021-02-05 NOTE — Patient Instructions (Addendum)
Liquid IV  -[facility], [facility],  Nuun tablets    Medication Instructions:  Your physician has recommended you make the following change in your medication:   START Metoprolol tartrate (Lopressor) 64m twice a day. *If you need a refill on your cardiac medications before your next appointment, please call your pharmacy*   Lab Work: TODAY: BMET, CBC TSH If you have labs (blood work) drawn today and your tests are completely normal, you will receive your results only by: .Marland KitchenMyChart Message (if you have MyChart) OR . A paper copy in the mail If you have any lab test that is abnormal or we need to change your treatment, we will call you to review the results.   Testing/Procedures: ZIO AT Long term monitor-Live Telemetry  Your physician has requested you wear a ZIO patch monitor for _14_ days.  This is a single patch monitor. [employer] supplies one patch monitor per enrollment. Additional stickers are not available.  Please do not apply patch if you will be having a Nuclear Stress Test, Echocardiogram, Cardiac CT, MRI, or Chest Xray during the time frame you would be wearing the monitor. The patch cannot be worn during these tests. You cannot remove and re-apply the ZIO AT patch monitor.   Your ZIO patch monitor will be sent Fed Ex from [employer]direct[employer] to your home address. The monitor may also be mailed to a PO BOX if home delivery is not available. It may take 3-5 days to receive your monitor after you have been enrolled.  Once you have received you monitor, please review enclosed instructions. Your monitor has already been registered assigning a specific monitor serial # to you.   Applying the monitor  Shave hair from upper left chest.  Hold abrader disc by orange tab. Rub abrader in 40 strokes over left upper chest as indicated in your monitor instructions.  Clean area with 4 enclosed alcohol pads. Use all pads to ensure the area is cleaned thoroughly. Let dry.  Apply patch as  indicated in monitor instructions. Patch will be placed under collarbone on left side of chest with arrow pointing upward.  Rub patch adhesive wings for 2 minutes. Remove the white label marked "1". Remove the white label marked "2". Rub patch adhesive wings for 2 additional minutes.  While looking in a mirror, press and release button in center of patch. A small green light will flash 3-4 times. This will be your only indicator the monitor has been turned on.  Do not shower for the first 24 hours. You may shower after the first 24 hours.  Press the button if you feel a symptom. You will hear a small click. Record Date, Time and Symptom in the Patient Log.   Starting the Gateway  In your kit there is a sHydrographic surveyorbox the size of a cellphone. This is yAirline pilot It transmits all your recorded data to [facility] This box must stay within 10 feet of you at all times. Open the box and push the * button. There will be a light that blinks orange and then green a few times. When the light stops blinking, the Gateway is connected to the ZIO patch.  Call [employer] at [phone] confirm your monitor is transmitting.   Returning your monitor  Remove your patch and place it inside the [place] In the lower half of the Gateway there is a white bag with prepaid postage on it. Place Gateway in bag and seal. Mail package back to  [employer] as soon as possible. Your physician should have your final report approximately 7 days after you have mailed back your monitor.   Call [place] at [phone] if you have questions regarding your ZIO AT patch monitor. Call them immediately if you see an orange light blinking on your monitor.  If your monitor falls off in less than 4 days contact our Monitor department at [phone]. If your monitor becomes loose or falls off after 4 days call [employer] at [phone] for suggestions on securing your monitor.     Follow-Up: At [facility],  you and your health needs are our priority.  As part of our continuing mission to provide you with exceptional heart care, we have created designated Provider Care Teams.  These Care Teams include your primary Cardiologist (physician) and Advanced Practice Providers (APPs -  Physician Assistants and Nurse Practitioners) who all work together to provide you with the care you need, when you need it.   Your next appointment:   3 month(s)  The format for your next appointment:   In Person  Provider:   You may see Mertie Moores, MD or one of the following Advanced Practice Providers on your designated Care Team:    Richardson Dopp, PA-C  [place], [place]

## 2021-02-05 NOTE — Progress Notes (Signed)
Patient ID: Andrea [name], female   DOB: 1971/01/04, 50 y.o.   MRN: [id number] Patient enrolled for [employer] to ship a 14 day ZIO AT monitor to her home.

## 2021-02-11 DIAGNOSIS — R002 Palpitations: Secondary | ICD-10-CM | POA: Diagnosis not present

## 2021-02-11 DIAGNOSIS — R42 Dizziness and giddiness: Secondary | ICD-10-CM | POA: Diagnosis not present

## 2021-03-17 ENCOUNTER — Ambulatory Visit: Payer: Managed Care, Other (non HMO) | Attending: *Deleted

## 2021-03-17 ENCOUNTER — Other Ambulatory Visit: Payer: Self-pay

## 2021-03-17 ENCOUNTER — Ambulatory Visit: Payer: Managed Care, Other (non HMO)

## 2021-03-17 DIAGNOSIS — M546 Pain in thoracic spine: Secondary | ICD-10-CM | POA: Diagnosis present

## 2021-03-17 NOTE — Therapy (Signed)
[place], [place], [address] Phone: [phone]   Fax:  [phone]  Physical Therapy Evaluation  Patient Details  Name: Andrea [name] MRN: [id number] Date of Birth: 03/28/1971 Referring Provider (PT): Allie Bossier, [place]   Encounter Date: 03/17/2021   PT End of Session - 03/17/21 1543    Visit Number 1    Number of Visits 12    Date for PT Re-Evaluation 05/03/21    Authorization Type [employer]    PT Start Time 1502    PT Stop Time 1548    PT Time Calculation (min) 46 min    Activity Tolerance Patient tolerated treatment well    Behavior During Therapy [facility] for tasks assessed/performed           Past Medical History:  Diagnosis Date  . Abnormal Pap smear of cervix    2010-2014 abnormal pap every 3-6 months   dysplastic squamous cells of cervix   . Anxiety    occ.- no meds  . Blood in stool   . Depression    no meds  . Diverticulitis 08/2019  . DUB (dysfunctional uterine bleeding)   . Dyslipidemia   . Dyspnea    Resolved - due to anemia - no current problems  . Eczema   . Endometriosis   . Epigastric pain   . Fatigue   . Fibroid   . GERD (gastroesophageal reflux disease)   . Grade I diastolic dysfunction 54/62/7035   Noted on ECHO  . Hemorrhoid    2nd and 3rd degree  . History of adenomatous polyp of colon   . History of palpitations   . HSV infection   . Hx of dysplastic nevus 07/17/2019   L upper back 4.0cm lateral to spine, moderate  . Hyperlipidemia    diet controlled - no meds  . Hypertension    not currently taking medication  . Infertility, female   . Iron deficiency anemia    Last iron infusion 09/14/2018  . Left groin hernia    history of  . Muscle cramping    with exertion  . Obesity   . Palpitations   . Recurrent bacterial infection    Vaginal  . Restless leg    due to anemia  . Smoker   . SVD (spontaneous vaginal delivery)    x 2  . Thoracic back  pain    while trying to sleep  . Tinnitus, left    hears heart beat, MRI normal  . Vertigo 2018    Past Surgical History:  Procedure Laterality Date  . ABLATION ON ENDOMETRIOSIS  1995  . anemia     due to gyn blood losses from uterine fibroids.  received PRBCs and iron infusions.    . CERVICAL BIOPSY  W/ LOOP ELECTRODE EXCISION    . CERVICAL CONIZATION W/BX N/A 09/20/2018   Procedure: CONIZATION CERVIX WITH BIOPSY;  Surgeon: Nunzio Cobbs, MD;  Location: [facility];  Service: Gynecology;  Laterality: N/A;  . COLONOSCOPY  06/2018   Dr Earlie Raveling.  9 mm sessile, hyperplastic sigmoid polyp.  diverticulosis.  int hemorrhoids.  2 cm cecal lipoma  . COLPOSCOPY     x 3  . CYSTOSCOPY N/A 11/14/2018   Procedure: CYSTOSCOPY;  Surgeon: Nunzio Cobbs, MD;  Location: [facility];  Service: Gynecology;  Laterality: N/A;  . DILATION AND CURETTAGE OF UTERUS  1993  . DILATION AND CURETTAGE OF UTERUS  N/A 09/20/2018   Procedure: FRACTIONAL DILATATION AND CURETTAGE;  Surgeon: Nunzio Cobbs, MD;  Location: [facility];  Service: Gynecology;  Laterality: N/A;  . GYNECOLOGIC CRYOSURGERY    . HEMORRHOID BANDING     several  . HERNIA REPAIR Left    childhood  . INTRAUTERINE DEVICE (IUD) INSERTION  2013    (Mirena)  . INTRAUTERINE DEVICE INSERTION  2000   [facility]  . IUD REMOVAL  2010  . IUD REMOVAL  2018  . TOTAL LAPAROSCOPIC HYSTERECTOMY WITH SALPINGECTOMY Bilateral 11/14/2018   Procedure: TOTAL LAPAROSCOPIC HYSTERECTOMY WITH BILATERAL SALPINGECTOMY AND LYSIS OF ADHESIONS;  Surgeon: Nunzio Cobbs, MD;  Location: [facility];  Service: Gynecology;  Laterality: Bilateral;  . TUBAL LIGATION     2010  . UPPER GI ENDOSCOPY  06/2018   x 2  . WISDOM TOOTH EXTRACTION      There were no vitals filed for this visit.    Subjective Assessment - 03/17/21 1502    Subjective Patient reports having midback pain for several years that only  occurs when she is sleeping. She has tried various pillows and mattresses, but have not helped. She was given pain medication, but read the warning and did not want to take it. She sleeps on her side and is waking up due to pain. She reports that she doesn't wake up every night because of pain, but when it does wake her up it is excruciating ache along her midback (at her bra line) that can radiate to each side. She reports on average waking 2-3 times per night. She reports occasional numbness/tingling in 4-5 digits bilaterally, but this is not always correlated to her midback pain. She reports frequent bowel movements, but has been followed by GI specialists having received 4 colonoscopies. She feels that it is neck related. She reports this has been going on for about 10 years without known cause.    Pertinent History See PMH above    Limitations Other (comment)   sleep   How long can you sit comfortably? no issues    How long can you stand comfortably? no issues    How long can you walk comfortably? no issues    Patient Stated Goals I just want to sleep good and not hurt    Currently in Pain? No/denies              [facility] PT Assessment - 03/17/21 0001      Assessment   Medical Diagnosis Spondylosis without myelopathy or radiculopathy, thoracic region    Referring Provider (PT) Czinsky, Stephani Police, PA    Onset Date/Surgical Date --   >1 year   Hand Dominance Right    Next MD Visit in 4 weeks    Prior Therapy none      Precautions   Precautions None      Restrictions   Weight Bearing Restrictions No      Balance Screen   Has the patient fallen in the past 6 months No      [place] residence    Living Arrangements Spouse/significant other;Children      Prior Function   Level of Independence Independent    Vocation Full time employment    Development worker, community    Leisure watch grandchild      Cognition   Overall  Cognitive Status Within Functional Limits for tasks assessed      Observation/Other Assessments  Focus on Therapeutic Outcomes (FOTO)  78% function to 83% predicted      Sensation   Light Touch Appears Intact      Coordination   Gross Motor Movements are Fluid and Coordinated Yes      Posture/Postural Control   Posture Comments forward head/rounded shoulders      ROM / Strength   AROM / PROM / Strength AROM      AROM   Overall AROM Comments pinching sensation posterior neck with extension and Rt sidebend and bilateral rotation; cervical, thoracic, and lumbar AROM WNL      Strength   Overall Strength Comments gross BUE/LE 5/5 with exception of bilateral hip extension and abduction 4/5 bilaterally      Flexibility   Soft Tissue Assessment /Muscle Length yes    Hamstrings significant tightness Rt; WNL Lt    Quadriceps WNL bilaterally      Palpation   Spinal mobility hypomobility CPAs T6-T12 with patient reporting referred pain to Lt gluteal region at T12.    Palpation comment tautness bilateral thoracic paraspinals      Special Tests   Other special tests (-) SLR                      Objective measurements completed on examination: See above findings.       [facility] Adult PT Treatment/Exercise - 03/17/21 0001      Self-Care   Self-Care Other Self-Care Comments    Other Self-Care Comments  see patient education                  PT Education - 03/17/21 1552    Education Details Education on current condition and POC.    Person(s) Educated Patient    Methods Explanation    Comprehension Verbalized understanding            PT Short Term Goals - 03/17/21 1553      PT SHORT TERM GOAL #1   Title STG=LTG             PT Long Term Goals - 03/17/21 1540      PT LONG TERM GOAL #1   Title Patient will be independent with advanced HEP.    Baseline no time at eval to issue.    Time 6    Period Weeks    Status New    Target Date 04/28/21       PT LONG TERM GOAL #2   Title Patient will report no difficulty with sleep as it relates to her back pain.    Baseline wakes 2-3 times per night on average due to midback pain.    Time 6    Period Weeks    Status New    Target Date 04/28/21      PT LONG TERM GOAL #3   Title Patient will demonstrate 5/5 bilateral hip extensors and abductor strength to improve stability about the chain.    Baseline 4/5 bilaterally    Time 6    Period Weeks    Status New    Target Date 04/28/21                  Plan - 03/17/21 1554    Clinical Impression Statement Patient is a 50 y/o female with chief complaint of chronic thoracic pain that awakens her night that has been ongoing for about 10 years without known cause. She describes the pain as an ache along thoracic region that  can radiate bilaterally that can awaken her on average 2-3 times per night. She has good lumbar, thoracic, and cervical AROM reporting pinching sensation about cervical spine with cervical extension, right lateral flexion, and bilateral rotation. She has hypomobility about mid/lower T-spine reporting pain along Lt gluteal region with PAIVM at T12. Overall she has good strength about BUE/LE with exception of bilateral hip extensors/abductors. She will benefit from skilled PT to address above stated deficits in order to assist in overall pain reduction and improve her quality of sleep.    Personal Factors and Comorbidities Time since onset of injury/illness/exacerbation;Comorbidity 3+    Comorbidities see PMH above    Examination-Activity Limitations Sleep    Stability/Clinical Decision Making Evolving/Moderate complexity    Clinical Decision Making Moderate    Rehab Potential Good    PT Frequency --   1-2/week   PT Duration --   4-6 weeks   PT Treatment/Interventions ADLs/Self Care Home Management;Cryotherapy;Moist Heat;Therapeutic activities;Therapeutic exercise;Neuromuscular re-education;Patient/family education;Manual  techniques;Dry needling;Taping;Spinal Manipulations    PT Next Visit Plan review FOTO, hamstring stretch, cat/camel, thread the needle, manual to T-spine.    PT Home Exercise Plan no time at eval to issue    Consulted and Agree with Plan of Care Patient           Patient will benefit from skilled therapeutic intervention in order to improve the following deficits and impairments:  Pain,Postural dysfunction,Decreased strength,Hypomobility  Visit Diagnosis: Pain in thoracic spine     Problem List Patient Active Problem List   Diagnosis Date Noted  . Palpitations 02/05/2021  . History of total hysterectomy 12/18/2020  . Diverticulitis 09/11/2019  . Iron deficiency anemia 09/12/2018  . Pelvic pain 08/25/2018  . Dyspareunia in female 08/25/2018  . Vertigo 09/15/2017   Gwendolyn Grant, PT, DPT, ATC 03/17/21 4:07 PM [facility] [address] [place], [place], [address] Phone: [phone]   Fax:  [phone]  Name: Andrea [name] MRN: [id number] Date of Birth: 10-Apr-1971

## 2021-03-19 ENCOUNTER — Other Ambulatory Visit: Payer: Self-pay

## 2021-03-19 ENCOUNTER — Ambulatory Visit: Payer: Managed Care, Other (non HMO) | Admitting: Physical Therapy

## 2021-03-19 ENCOUNTER — Encounter: Payer: Self-pay | Admitting: Physical Therapy

## 2021-03-19 DIAGNOSIS — M546 Pain in thoracic spine: Secondary | ICD-10-CM

## 2021-03-19 NOTE — Patient Instructions (Signed)
Access Code: [email or web address] URL: [email or web address] Date: 03/19/2021 Prepared by: Hessie Diener  Exercises Cat-Camel - 2 x daily - 7 x weekly - 2 sets - 10 reps - 5 hold Child's Pose with Thread the Needle - 2 x daily - 7 x weekly - 1 sets - 5 reps - 10-30 hold Sidelying Thoracic Rotation with Open Book - 2 x daily - 7 x weekly - 1 sets - 5-10 reps - 5-10 hold Pelvic tilt - 2 x daily - 7 x weekly - 2 sets - 10 reps - 5 hold Supine Hamstring Stretch with Strap - 2 x daily - 7 x weekly - 1 sets - 3 reps - 20-30 hold

## 2021-03-19 NOTE — Therapy (Signed)
[place], [place], [address] Phone: [phone]   Fax:  [phone]  Physical Therapy Treatment  Patient Details  Name: Andrea [name] MRN: [id number] Date of Birth: 1971-04-01 Referring Provider (PT): Allie Bossier, [place]   Encounter Date: 03/19/2021   PT End of Session - 03/19/21 1109    Visit Number 2    Number of Visits 12    Date for PT Re-Evaluation 05/03/21    Authorization Type [employer]    PT Start Time 1100    PT Stop Time 1145    PT Time Calculation (min) 45 min           Past Medical History:  Diagnosis Date  . Abnormal Pap smear of cervix    2010-2014 abnormal pap every 3-6 months   dysplastic squamous cells of cervix   . Anxiety    occ.- no meds  . Blood in stool   . Depression    no meds  . Diverticulitis 08/2019  . DUB (dysfunctional uterine bleeding)   . Dyslipidemia   . Dyspnea    Resolved - due to anemia - no current problems  . Eczema   . Endometriosis   . Epigastric pain   . Fatigue   . Fibroid   . GERD (gastroesophageal reflux disease)   . Grade I diastolic dysfunction 99/35/7017   Noted on ECHO  . Hemorrhoid    2nd and 3rd degree  . History of adenomatous polyp of colon   . History of palpitations   . HSV infection   . Hx of dysplastic nevus 07/17/2019   L upper back 4.0cm lateral to spine, moderate  . Hyperlipidemia    diet controlled - no meds  . Hypertension    not currently taking medication  . Infertility, female   . Iron deficiency anemia    Last iron infusion 09/14/2018  . Left groin hernia    history of  . Muscle cramping    with exertion  . Obesity   . Palpitations   . Recurrent bacterial infection    Vaginal  . Restless leg    due to anemia  . Smoker   . SVD (spontaneous vaginal delivery)    x 2  . Thoracic back pain    while trying to sleep  . Tinnitus, left    hears heart beat, MRI normal  . Vertigo 2018    Past Surgical  History:  Procedure Laterality Date  . ABLATION ON ENDOMETRIOSIS  1995  . anemia     due to gyn blood losses from uterine fibroids.  received PRBCs and iron infusions.    . CERVICAL BIOPSY  W/ LOOP ELECTRODE EXCISION    . CERVICAL CONIZATION W/BX N/A 09/20/2018   Procedure: CONIZATION CERVIX WITH BIOPSY;  Surgeon: Nunzio Cobbs, MD;  Location: [facility];  Service: Gynecology;  Laterality: N/A;  . COLONOSCOPY  06/2018   Dr Earlie Raveling.  9 mm sessile, hyperplastic sigmoid polyp.  diverticulosis.  int hemorrhoids.  2 cm cecal lipoma  . COLPOSCOPY     x 3  . CYSTOSCOPY N/A 11/14/2018   Procedure: CYSTOSCOPY;  Surgeon: Nunzio Cobbs, MD;  Location: [facility];  Service: Gynecology;  Laterality: N/A;  . DILATION AND CURETTAGE OF UTERUS  1993  . DILATION AND CURETTAGE OF UTERUS N/A 09/20/2018   Procedure: FRACTIONAL DILATATION AND CURETTAGE;  Surgeon: Nunzio Cobbs, MD;  Location:  [facility];  Service: Gynecology;  Laterality: N/A;  . GYNECOLOGIC CRYOSURGERY    . HEMORRHOID BANDING     several  . HERNIA REPAIR Left    childhood  . INTRAUTERINE DEVICE (IUD) INSERTION  2013    (Mirena)  . INTRAUTERINE DEVICE INSERTION  2000   [facility]  . IUD REMOVAL  2010  . IUD REMOVAL  2018  . TOTAL LAPAROSCOPIC HYSTERECTOMY WITH SALPINGECTOMY Bilateral 11/14/2018   Procedure: TOTAL LAPAROSCOPIC HYSTERECTOMY WITH BILATERAL SALPINGECTOMY AND LYSIS OF ADHESIONS;  Surgeon: Nunzio Cobbs, MD;  Location: [facility];  Service: Gynecology;  Laterality: Bilateral;  . TUBAL LIGATION     2010  . UPPER GI ENDOSCOPY  06/2018   x 2  . WISDOM TOOTH EXTRACTION      There were no vitals filed for this visit.   Subjective Assessment - 03/19/21 1105    Subjective Feeling fine this morning. I actually did not wake up until almost morning and it was not as bad. I felt some pain lower than usual.    Currently in Pain? No/denies                              [facility] Adult PT Treatment/Exercise - 03/19/21 0001      Exercises   Exercises Lumbar      Lumbar Exercises: Stretches   Single Knee to Chest Stretch 2 reps;20 seconds    Single Knee to Chest Stretch Limitations to decrease spasm after bridge    Quadruped Mid Back Stretch Limitations rocking from q ped to childs pose 10 sec x 5,  then thread the needle from childs pose 10 sec x 3 each side    Other Lumbar Stretch Exercise open books x 5 each way      Lumbar Exercises: Aerobic   Nustep L5 UE/LE x 5 min      Lumbar Exercises: Supine   Pelvic Tilt 10 reps    Pelvic Tilt Limitations cues for abdominal and gluteal activation and breathing    Bridge Limitations 1 rep for 5 sec , had spasm in lower back, relieved with SKTC      Lumbar Exercises: Quadruped   [place]cat/Old Horse 10 reps                  PT Education - 03/19/21 1137    Education Details HEP    Person(s) Educated Patient    Methods Explanation;Handout    Comprehension Verbalized understanding            PT Short Term Goals - 03/17/21 1553      PT SHORT TERM GOAL #1   Title STG=LTG             PT Long Term Goals - 03/17/21 1540      PT LONG TERM GOAL #1   Title Patient will be independent with advanced HEP.    Baseline no time at eval to issue.    Time 6    Period Weeks    Status New    Target Date 04/28/21      PT LONG TERM GOAL #2   Title Patient will report no difficulty with sleep as it relates to her back pain.    Baseline wakes 2-3 times per night on average due to midback pain.    Time 6    Period Weeks    Status New    Target Date  04/28/21      PT LONG TERM GOAL #3   Title Patient will demonstrate 5/5 bilateral hip extensors and abductor strength to improve stability about the chain.    Baseline 4/5 bilaterally    Time 6    Period Weeks    Status New    Target Date 04/28/21                 Plan - 03/19/21 1227    Clinical Impression  Statement Pt arrives reporting no pain. She slept better last night compared to most night. Session spent establishing HEP. Instructed pt in spinal mobility exercises and hamstring stretches. She felt best hamstring stretch is supine. She reports sitting with one foot under her most of the time which may contribute to her right hamsring tightness. She tolerated all spine mobility exercises well with min fatigue. She reported increased pain in lumbar spine with bridge which resolved with knee to chest stretching. Pt was issued HEP. She had no pain at end of session and felt loose.    PT Next Visit Plan review FOTO next visit, assess response to HEP, manual to T-spine. hip/core strength as tolerated    PT Home Exercise Plan [facility]           Patient will benefit from skilled therapeutic intervention in order to improve the following deficits and impairments:  Pain,Postural dysfunction,Decreased strength,Hypomobility  Visit Diagnosis: Pain in thoracic spine     Problem List Patient Active Problem List   Diagnosis Date Noted  . Palpitations 02/05/2021  . History of total hysterectomy 12/18/2020  . Diverticulitis 09/11/2019  . Iron deficiency anemia 09/12/2018  . Pelvic pain 08/25/2018  . Dyspareunia in female 08/25/2018  . Vertigo 09/15/2017    Dorene Ar, PTA 03/19/2021, 12:34 PM  [facility] [address] [place], [place], [address] Phone: [phone]   Fax:  [phone]  Name: Andrea [name] MRN: [id number] Date of Birth: 1971-01-28

## 2021-03-25 ENCOUNTER — Other Ambulatory Visit: Payer: Self-pay

## 2021-03-25 ENCOUNTER — Ambulatory Visit: Payer: Managed Care, Other (non HMO)

## 2021-03-25 ENCOUNTER — Ambulatory Visit: Payer: Managed Care, Other (non HMO) | Admitting: Physical Therapy

## 2021-03-25 DIAGNOSIS — M546 Pain in thoracic spine: Secondary | ICD-10-CM | POA: Diagnosis not present

## 2021-03-25 NOTE — Patient Instructions (Signed)
  [id number]

## 2021-03-25 NOTE — Therapy (Addendum)
[place], [place], [address] Phone: [phone]   Fax:  [phone]  Physical Therapy Treatment  Patient Details  Name: Andrea [name] MRN: [id number] Date of Birth: 15-Nov-1970 Referring Provider (PT): Allie Bossier, [place]   Encounter Date: 03/25/2021   PT End of Session - 03/25/21 1307    Visit Number 3    Number of Visits 12    Date for PT Re-Evaluation 05/03/21    Authorization Type [employer]    PT Start Time 1217    PT Stop Time 1303    PT Time Calculation (min) 46 min    Activity Tolerance Patient tolerated treatment well    Behavior During Therapy [facility] for tasks assessed/performed           Past Medical History:  Diagnosis Date  . Abnormal Pap smear of cervix    2010-2014 abnormal pap every 3-6 months   dysplastic squamous cells of cervix   . Anxiety    occ.- no meds  . Blood in stool   . Depression    no meds  . Diverticulitis 08/2019  . DUB (dysfunctional uterine bleeding)   . Dyslipidemia   . Dyspnea    Resolved - due to anemia - no current problems  . Eczema   . Endometriosis   . Epigastric pain   . Fatigue   . Fibroid   . GERD (gastroesophageal reflux disease)   . Grade I diastolic dysfunction 50/27/7412   Noted on ECHO  . Hemorrhoid    2nd and 3rd degree  . History of adenomatous polyp of colon   . History of palpitations   . HSV infection   . Hx of dysplastic nevus 07/17/2019   L upper back 4.0cm lateral to spine, moderate  . Hyperlipidemia    diet controlled - no meds  . Hypertension    not currently taking medication  . Infertility, female   . Iron deficiency anemia    Last iron infusion 09/14/2018  . Left groin hernia    history of  . Muscle cramping    with exertion  . Obesity   . Palpitations   . Recurrent bacterial infection    Vaginal  . Restless leg    due to anemia  . Smoker   . SVD (spontaneous vaginal delivery)    x 2  . Thoracic back pain     while trying to sleep  . Tinnitus, left    hears heart beat, MRI normal  . Vertigo 2018    Past Surgical History:  Procedure Laterality Date  . ABLATION ON ENDOMETRIOSIS  1995  . anemia     due to gyn blood losses from uterine fibroids.  received PRBCs and iron infusions.    . CERVICAL BIOPSY  W/ LOOP ELECTRODE EXCISION    . CERVICAL CONIZATION W/BX N/A 09/20/2018   Procedure: CONIZATION CERVIX WITH BIOPSY;  Surgeon: Nunzio Cobbs, MD;  Location: [facility];  Service: Gynecology;  Laterality: N/A;  . COLONOSCOPY  06/2018   Dr Earlie Raveling.  9 mm sessile, hyperplastic sigmoid polyp.  diverticulosis.  int hemorrhoids.  2 cm cecal lipoma  . COLPOSCOPY     x 3  . CYSTOSCOPY N/A 11/14/2018   Procedure: CYSTOSCOPY;  Surgeon: Nunzio Cobbs, MD;  Location: [facility];  Service: Gynecology;  Laterality: N/A;  . DILATION AND CURETTAGE OF UTERUS  1993  . DILATION AND CURETTAGE OF UTERUS  N/A 09/20/2018   Procedure: FRACTIONAL DILATATION AND CURETTAGE;  Surgeon: Nunzio Cobbs, MD;  Location: [facility];  Service: Gynecology;  Laterality: N/A;  . GYNECOLOGIC CRYOSURGERY    . HEMORRHOID BANDING     several  . HERNIA REPAIR Left    childhood  . INTRAUTERINE DEVICE (IUD) INSERTION  2013    (Mirena)  . INTRAUTERINE DEVICE INSERTION  2000   [facility]  . IUD REMOVAL  2010  . IUD REMOVAL  2018  . TOTAL LAPAROSCOPIC HYSTERECTOMY WITH SALPINGECTOMY Bilateral 11/14/2018   Procedure: TOTAL LAPAROSCOPIC HYSTERECTOMY WITH BILATERAL SALPINGECTOMY AND LYSIS OF ADHESIONS;  Surgeon: Nunzio Cobbs, MD;  Location: [facility];  Service: Gynecology;  Laterality: Bilateral;  . TUBAL LIGATION     2010  . UPPER GI ENDOSCOPY  06/2018   x 2  . WISDOM TOOTH EXTRACTION      There were no vitals filed for this visit.   Subjective Assessment - 03/25/21 1218    Subjective Pt reports not being adherent to her HEP due to going to the beach for  [place] day weekend. She states that was able to do her exercises about 2 days after her initial evaluation, which helped her to sleep well for a few nights. She states she has been paying more attention to her pain pattern and has noticed it is more in her thoracolumbar area than purely her upper back.    Pertinent History See PMH above    Limitations Other (comment)    How long can you sit comfortably? no issues    How long can you stand comfortably? no issues    How long can you walk comfortably? no issues    Patient Stated Goals I just want to sleep good and not hurt    Currently in Pain? No/denies    Multiple Pain Sites No                             [facility] Adult PT Treatment/Exercise - 03/25/21 0001      Lumbar Exercises: Standing   Other Standing Lumbar Exercises Pallof Press 2x10 BIL 7-10# cc      Lumbar Exercises: Supine   Large Ball Abdominal Isometric --   4x30sec     Manual Therapy   Manual Therapy Joint mobilization    Manual therapy comments Cross-friction to BIL thoracic paraspinals followed by efflourage    Joint Mobilization Prone grade V thoracic manipulation throughout T-spine with cavitation; Sidelying grade V lumbar manipulation with cavitation BIL                  PT Education - 03/25/21 1414    Education Details Pt educated on importance of HEP adherence to her outcomes. Also instructed on proper form for exercises performed today.    Person(s) Educated Patient    Methods Explanation;Demonstration;Tactile cues;Verbal cues;Handout    Comprehension Verbal cues required;Tactile cues required;Verbalized understanding;Returned demonstration            PT Short Term Goals - 03/17/21 1553      PT SHORT TERM GOAL #1   Title STG=LTG             PT Long Term Goals - 03/17/21 1540      PT LONG TERM GOAL #1   Title Patient will be independent with advanced HEP.    Baseline no time at eval to issue.    Time  6    Period Weeks     Status New    Target Date 04/28/21      PT LONG TERM GOAL #2   Title Patient will report no difficulty with sleep as it relates to her back pain.    Baseline wakes 2-3 times per night on average due to midback pain.    Time 6    Period Weeks    Status New    Target Date 04/28/21      PT LONG TERM GOAL #3   Title Patient will demonstrate 5/5 bilateral hip extensors and abductor strength to improve stability about the chain.    Baseline 4/5 bilaterally    Time 6    Period Weeks    Status New    Target Date 04/28/21                 Plan - 03/25/21 1311    Clinical Impression Statement The pt is a pleasant 50yo F who arrives to clinic with primary c/o thoracolumbar pain with sleeping. Upon performing joint mobility assessment of C-spine, T-spine, and L-spine, hypomobility and pain was noted from T6-T11 and L2-L4. Following thoracic and lumbar manipulation with cavitations noted, the pt reported feeling less stiff. Reassessment of spinal segmental mobility confirmed this and joint mobility and pain had improved throughout the T-spine and L-spine. The pt responded well to all exercises, demonstrating proper form and no increase in pain. She also responded well to manual therapy, stating that she plans on teaching her husband the manual techniques when she gets home. She will continue to benefit from skilled PT to address her primary impairments and help her return to sleeping with no pain.    Personal Factors and Comorbidities Time since onset of injury/illness/exacerbation;Comorbidity 3+    Comorbidities see PMH above    Examination-Activity Limitations Sleep    Stability/Clinical Decision Making Evolving/Moderate complexity    Clinical Decision Making Moderate    Rehab Potential Good    PT Frequency Other (comment)   1-2x/week   PT Duration Other (comment)   4-6 weeks   PT Treatment/Interventions ADLs/Self Care Home Management;Cryotherapy;Moist Heat;Therapeutic  activities;Therapeutic exercise;Neuromuscular re-education;Patient/family education;Manual techniques;Dry needling;Taping;Spinal Manipulations    PT Next Visit Plan review FOTO next visit, assess response to HEP, manual to T-spine. hip/core strength as tolerated    PT Home Exercise Plan [id number]    Consulted and Agree with Plan of Care Patient           Patient will benefit from skilled therapeutic intervention in order to improve the following deficits and impairments:  Pain,Postural dysfunction,Decreased strength,Hypomobility  Visit Diagnosis: Pain in thoracic spine     Problem List Patient Active Problem List   Diagnosis Date Noted  . Palpitations 02/05/2021  . History of total hysterectomy 12/18/2020  . Diverticulitis 09/11/2019  . Iron deficiency anemia 09/12/2018  . Pelvic pain 08/25/2018  . Dyspareunia in female 08/25/2018  . Vertigo 09/15/2017    Vanessa , PT, DPT 03/25/21 2:16 PM   [facility] [address] [place], [place], [address] Phone: [phone]   Fax:  [phone]  Name: Andrea [name] MRN: [id number] Date of Birth: 30-Aug-1971

## 2021-04-01 ENCOUNTER — Ambulatory Visit: Payer: Managed Care, Other (non HMO) | Attending: *Deleted

## 2021-04-01 ENCOUNTER — Ambulatory Visit: Payer: Managed Care, Other (non HMO)

## 2021-04-01 ENCOUNTER — Other Ambulatory Visit: Payer: Self-pay

## 2021-04-01 DIAGNOSIS — M546 Pain in thoracic spine: Secondary | ICD-10-CM | POA: Diagnosis present

## 2021-04-01 NOTE — Therapy (Addendum)
Andrea [name], [place], [address] Phone: [phone]   Fax:  [phone]  Physical Therapy Treatment/Discharge  Patient Details  Name: Andrea [name] MRN: [id number] Date of Birth: [birth year] Referring Provider (PT): Allie Bossier, [place]   Encounter Date: 04/01/2021   PT End of Session - 04/01/21 1745     Visit Number 4    Number of Visits 12    Date for PT Re-Evaluation 05/03/21    Authorization Type [employer]    PT Start Time 1746    PT Stop Time 6962    PT Time Calculation (min) 44 min    Activity Tolerance Patient tolerated treatment well    Behavior During Therapy [facility] for tasks assessed/performed             Past Medical History:  Diagnosis Date   Abnormal Pap smear of cervix    2010-2014 abnormal pap every 3-6 months   dysplastic squamous cells of cervix    Anxiety    occ.- no meds   Blood in stool    Depression    no meds   Diverticulitis 08/2019   DUB (dysfunctional uterine bleeding)    Dyslipidemia    Dyspnea    Resolved - due to anemia - no current problems   Eczema    Endometriosis    Epigastric pain    Fatigue    Fibroid    GERD (gastroesophageal reflux disease)    Grade I diastolic dysfunction 95/28/4132   Noted on ECHO   Hemorrhoid    2nd and 3rd degree   History of adenomatous polyp of colon    History of palpitations    HSV infection    Hx of dysplastic nevus 07/17/2019   L upper back 4.0cm lateral to spine, moderate   Hyperlipidemia    diet controlled - no meds   Hypertension    not currently taking medication   Infertility, female    Iron deficiency anemia    Last iron infusion 09/14/2018   Left groin hernia    history of   Muscle cramping    with exertion   Obesity    Palpitations    Recurrent bacterial infection    Vaginal   Restless leg    due to anemia   Smoker    SVD (spontaneous vaginal delivery)    x 2   Thoracic back pain    while trying  to sleep   Tinnitus, left    hears heart beat, MRI normal   Vertigo 2018    Past Surgical History:  Procedure Laterality Date   ABLATION ON ENDOMETRIOSIS  1995   anemia     due to gyn blood losses from uterine fibroids.  received PRBCs and iron infusions.     CERVICAL BIOPSY  W/ LOOP ELECTRODE EXCISION     CERVICAL CONIZATION W/BX N/A 09/20/2018   Procedure: CONIZATION CERVIX WITH BIOPSY;  Surgeon: Nunzio Cobbs, MD;  Location: [facility];  Service: Gynecology;  Laterality: N/A;   COLONOSCOPY  06/2018   Dr Earlie Raveling.  9 mm sessile, hyperplastic sigmoid polyp.  diverticulosis.  int hemorrhoids.  2 cm cecal lipoma   COLPOSCOPY     x 3   CYSTOSCOPY N/A 11/14/2018   Procedure: CYSTOSCOPY;  Surgeon: Nunzio Cobbs, MD;  Location: [facility];  Service: Gynecology;  Laterality: N/A;   DILATION AND CURETTAGE OF UTERUS  1993   DILATION AND  CURETTAGE OF UTERUS N/A 09/20/2018   Procedure: FRACTIONAL DILATATION AND CURETTAGE;  Surgeon: Nunzio Cobbs, MD;  Location: [facility];  Service: Gynecology;  Laterality: N/A;   GYNECOLOGIC CRYOSURGERY     HEMORRHOID BANDING     several   HERNIA REPAIR Left    childhood   INTRAUTERINE DEVICE (IUD) INSERTION  2013    (Mirena)   INTRAUTERINE DEVICE INSERTION  2000   [facility]   IUD REMOVAL  2010   IUD REMOVAL  2018   TOTAL LAPAROSCOPIC HYSTERECTOMY WITH SALPINGECTOMY Bilateral 11/14/2018   Procedure: TOTAL LAPAROSCOPIC HYSTERECTOMY WITH BILATERAL SALPINGECTOMY AND LYSIS OF ADHESIONS;  Surgeon: Nunzio Cobbs, MD;  Location: [facility];  Service: Gynecology;  Laterality: Bilateral;   TUBAL LIGATION     2010   UPPER GI ENDOSCOPY  06/2018   x 2   WISDOM TOOTH EXTRACTION      There were no vitals filed for this visit.   Subjective Assessment - 04/01/21 1747     Subjective "I haven't been good patient doing my exercises." Patient reports when she is consistent with her HEP her pain  is not as frequent. She did have an intense episode of pain last night. She reports intermittent numbness in bilateral hands (occasionally in digits 3-5 when she is sitting playing on her phone and occasionally digits 1-3 without known cause). Upon further questioning patient reports bloody stool that her GI specialist has related to hemorrhoid and she has not been using the prescribed cream for this and has had multiple colonscopies that has shown diverticulitis and polyps removed. She also reports not drinking hardly any water on a daily basis and feels that sometimes her back pain can be related to the food she eats.    Pertinent History See PMH above    Limitations Other (comment)    How long can you sit comfortably? no issues    How long can you stand comfortably? no issues    How long can you walk comfortably? no issues    Patient Stated Goals I just want to sleep good and not hurt    Currently in Pain? No/denies                               [facility] Adult PT Treatment/Exercise - 04/01/21 0001       Self-Care   Other Self-Care Comments  see patient education      Lumbar Exercises: Stretches   Passive Hamstring Stretch 60 seconds    Passive Hamstring Stretch Limitations bilateral      Lumbar Exercises: Sidelying   Hip Abduction 15 reps    Hip Abduction Limitations x2 bilateral      Lumbar Exercises: Quadruped   [place]cat/Old Horse 20 reps    Other Quadruped Lumbar Exercises thread the needle 1 x 10 each                    PT Education - 04/01/21 1823     Education Details Recommended to utilize pillow in between her legs during sleep. Recommended to keep nutrition log as patient reports she sometimes feels that her back pain is related to her diet. Recommended to begin utilizing hemorrhoid cream as prescribed by MD. Recommended to begin drinking at least 1 glass of water per day. Reviewed HEP.    Person(s) Educated Patient    Methods Explanation     Comprehension  Verbalized understanding              PT Short Term Goals - 03/17/21 1553       PT SHORT TERM GOAL #1   Title STG=LTG               PT Long Term Goals - 03/17/21 1540       PT LONG TERM GOAL #1   Title Patient will be independent with advanced HEP.    Baseline no time at eval to issue.    Time 6    Period Weeks    Status New    Target Date 04/28/21      PT LONG TERM GOAL #2   Title Patient will report no difficulty with sleep as it relates to her back pain.    Baseline wakes 2-3 times per night on average due to midback pain.    Time 6    Period Weeks    Status New    Target Date 04/28/21      PT LONG TERM GOAL #3   Title Patient will demonstrate 5/5 bilateral hip extensors and abductor strength to improve stability about the chain.    Baseline 4/5 bilaterally    Time 6    Period Weeks    Status New    Target Date 04/28/21                   Plan - 04/01/21 1826     Clinical Impression Statement Time spent educating patient on proper sleep positioning with recommendation to place pillow inbetween her knees as she is a side sleeper. Patient gave more detail in regards to her bowel issues reporting that she typically has bloody stools, though has undergone multiple colonoscopies and her GI specialist has prescribed hemorrhoid cream for this issue and admits to not using it. It was recommended that she beging using her medication as prescribed to determine if this helps with her bloody stools. She further reports she does not drink hardly any water daily, so was recommended to begin drinking at least 1 glass per day. She also reported that she notices that her back pain can be effected by what she is eating, so recommended that she begin keeping a nutrition log. Continued with spinal mobility and hip strengthening today without reports of pain, . Encouraged patient to comply with HEP.    Personal Factors and Comorbidities Time since onset of  injury/illness/exacerbation;Comorbidity 3+    Comorbidities see PMH above    Examination-Activity Limitations Sleep    Stability/Clinical Decision Making Evolving/Moderate complexity    Rehab Potential Good    PT Frequency Other (comment)   1-2x/week   PT Duration Other (comment)   4-6 weeks   PT Treatment/Interventions ADLs/Self Care Home Management;Cryotherapy;Moist Heat;Therapeutic activities;Therapeutic exercise;Neuromuscular re-education;Patient/family education;Manual techniques;Dry needling;Taping;Spinal Manipulations    PT Next Visit Plan did patient complete recommendations? abdominal palpation. f/u about hand numbness.    PT Home Exercise Plan [id number]    Consulted and Agree with Plan of Care Patient             Patient will benefit from skilled therapeutic intervention in order to improve the following deficits and impairments:  Pain,Postural dysfunction,Decreased strength,Hypomobility  Visit Diagnosis: Pain in thoracic spine     Problem List Patient Active Problem List   Diagnosis Date Noted   Palpitations 02/05/2021   History of total hysterectomy 12/18/2020   Diverticulitis 09/11/2019   Iron deficiency anemia 09/12/2018   Pelvic pain  08/25/2018   Dyspareunia in female 08/25/2018   Vertigo 09/15/2017   Gwendolyn Grant, PT, DPT, ATC 04/01/21 6:44 PM PHYSICAL THERAPY DISCHARGE SUMMARY  Visits from Start of Care: 4  Current functional level related to goals / functional outcomes: Goals not formally re-assessed   Remaining deficits: Status unknown   Education / Equipment: See education above    Patient agrees to discharge. Patient goals were not met. Patient is being discharged due to not returning since the last visit.  Gwendolyn Grant, PT, DPT, ATC 05/27/21 4:27 PM  [facility] [address] [place], [place], [address] Phone: [phone]   Fax:  [phone]  Name: Andrea [name] MRN:  [id number] Date of Birth: 1971-09-07

## 2021-05-01 ENCOUNTER — Encounter: Payer: Self-pay | Admitting: Oncology

## 2021-05-07 ENCOUNTER — Encounter: Payer: Self-pay | Admitting: Oncology

## 2021-05-07 ENCOUNTER — Ambulatory Visit: Payer: Managed Care, Other (non HMO) | Admitting: Cardiovascular Disease

## 2021-05-12 ENCOUNTER — Encounter: Payer: Self-pay | Admitting: Cardiovascular Disease

## 2021-05-12 NOTE — Progress Notes (Signed)
Cardiology Office Note:    Date:  05/13/2021   ID:  Andrea [name], Andrea [name] 01-30-1971, MRN [id number]  PCP:  Jilda Panda, MD  Cardiologist:  Mertie Moores, MD    Referring MD: Jilda Panda, MD   Chief Complaint  Patient presents with   Chest Pain   Problem List   1. Essential hypertension History of chest pain Dizziness   Prior notes:    Andrea [name] is a 50 y.o. female with a hx of Hypertension who presents today for episodes of chest tightness and dizziness.  Kourtney is seen for the first time today  She has had some symptoms of diaphoresis on occasion.   Has lightheadedness - has been diagnosed with benign positional vertigo . Occurs randomly  - does not occur regularly . Last year she was exercising more and had lots of dizziness Had stopped smoking , lost 35 lbs But relapsed, restarted smoking in Feb.    Has some episodes of chest tightness.   Has been diagnosied with HTN  - tried [place] - HCT but she had hypotension.     Has   Has been on phentermine for about a year.   September 15, 2017:  Andrea [name] is seen back today for follow-up visit. She was seen several months ago with some chest pain.  A coronary calcium score is 0. Coronary CT angiogram was normal. She was also having some palpitations that were probably related to phentermine. Has had these palps for years.  Has worn a monitir . Still has fleeting pains.  Still has vertigo .  Has been to ENT.   Needs to go again  BP is elevated  Her primary MD has started her on a BP med but she has not picked it up Still smoking  Not exercising yet  Echocardiogram showed normal left ventricular systolic function with EF of 60-65%.  She has grade 1 diastolic dysfunction.  She has trivial aortic insufficiency.  February 05, 2021:  Andrea [name] is seen today for follow up of her CP. Coronary calcium score was 0 . Has some palpitations - which were likely due to phentermine.  Still smoking  - we discussed at length    Has some intermittant gas pain   Has been exercising ( [employer])  Lost 20 lbs . But she still smokes.  Gets lightheaded toward the end of the boot camp.  She is noticed that her heart rate seems to be a little bit fast.  Even when she is out walking in the park with her husband her Fitbit recorded a heart rate of over 200.  HR will occasionaly spike up to 130s even while she is sitting  Seems to occur after eating    May 13, 2021:  Andrea [name] is seen today for follow up of her palpitations CCA = 0. Still smoking , perhaps 1/2 ppd   Wt is 229 lbs.   Has stopped exercising . Has a new job - still at [employer]   Her palpitations are better on the metoprolol  Sometimes she skips the am dose. She does not eat breakfast   Past Medical History:  Diagnosis Date   Abnormal Pap smear of cervix    2010-2014 abnormal pap every 3-6 months   dysplastic squamous cells of cervix    Anxiety    occ.- no meds   Blood in stool    Depression    no meds   Diverticulitis 08/2019   DUB (dysfunctional uterine bleeding)  Dyslipidemia    Dyspnea    Resolved - due to anemia - no current problems   Eczema    Endometriosis    Epigastric pain    Fatigue    Fibroid    GERD (gastroesophageal reflux disease)    Grade I diastolic dysfunction 87/86/7672   Noted on ECHO   Hemorrhoid    2nd and 3rd degree   History of adenomatous polyp of colon    History of palpitations    HSV infection    Hx of dysplastic nevus 07/17/2019   L upper back 4.0cm lateral to spine, moderate   Hyperlipidemia    diet controlled - no meds   Hypertension    not currently taking medication   Infertility, female    Iron deficiency anemia    Last iron infusion 09/14/2018   Left groin hernia    history of   Muscle cramping    with exertion   Obesity    Palpitations    Recurrent bacterial infection    Vaginal   Restless leg    due to anemia   Smoker    SVD (spontaneous vaginal delivery)    x 2    Thoracic back pain    while trying to sleep   Tinnitus, left    hears heart beat, MRI normal   Vertigo 2018    Past Surgical History:  Procedure Laterality Date   ABLATION ON ENDOMETRIOSIS  1995   anemia     due to gyn blood losses from uterine fibroids.  received PRBCs and iron infusions.     CERVICAL BIOPSY  W/ LOOP ELECTRODE EXCISION     CERVICAL CONIZATION W/BX N/A 09/20/2018   Procedure: CONIZATION CERVIX WITH BIOPSY;  Surgeon: Nunzio Cobbs, MD;  Location: [facility];  Service: Gynecology;  Laterality: N/A;   COLONOSCOPY  06/2018   Dr Earlie Raveling.  9 mm sessile, hyperplastic sigmoid polyp.  diverticulosis.  int hemorrhoids.  2 cm cecal lipoma   COLPOSCOPY     x 3   CYSTOSCOPY N/A 11/14/2018   Procedure: CYSTOSCOPY;  Surgeon: Nunzio Cobbs, MD;  Location: [facility];  Service: Gynecology;  Laterality: N/A;   DILATION AND CURETTAGE OF UTERUS  1993   DILATION AND CURETTAGE OF UTERUS N/A 09/20/2018   Procedure: FRACTIONAL DILATATION AND CURETTAGE;  Surgeon: Nunzio Cobbs, MD;  Location: [facility];  Service: Gynecology;  Laterality: N/A;   GYNECOLOGIC CRYOSURGERY     HEMORRHOID BANDING     several   HERNIA REPAIR Left    childhood   INTRAUTERINE DEVICE (IUD) INSERTION  2013    (Mirena)   INTRAUTERINE DEVICE INSERTION  2000   [facility]   IUD REMOVAL  2010   IUD REMOVAL  2018   TOTAL LAPAROSCOPIC HYSTERECTOMY WITH SALPINGECTOMY Bilateral 11/14/2018   Procedure: TOTAL LAPAROSCOPIC HYSTERECTOMY WITH BILATERAL SALPINGECTOMY AND LYSIS OF ADHESIONS;  Surgeon: Nunzio Cobbs, MD;  Location: [facility];  Service: Gynecology;  Laterality: Bilateral;   TUBAL LIGATION     2010   UPPER GI ENDOSCOPY  06/2018   x 2   WISDOM TOOTH EXTRACTION      Current Medications: Current Meds  Medication Sig   acetaminophen (TYLENOL) 325 MG tablet Take 2 tablets (650 mg total) by mouth every 6 (six) hours as needed for mild pain  (or temp > 100).   hydrocortisone (ANUSOL-HC) 2.5 % rectal cream  ibuprofen (ADVIL) 200 MG tablet Take 600-800 mg by mouth every 6 (six) hours as needed for moderate pain.   metoprolol tartrate (LOPRESSOR) 25 MG tablet Take 1 tablet (25 mg total) by mouth 2 (two) times daily.   omeprazole (PRILOSEC) 40 MG capsule Take 40 mg by mouth every morning.     Allergies:   Patient has no known allergies.   Social History   Socioeconomic History   Marital status: Married    Spouse name: Not on file   Number of children: 2   Years of education: Not on file   Highest education level: Not on file  Occupational History   Not on file  Tobacco Use   Smoking status: Every Day    Packs/day: 0.75    Years: 35.00    Pack years: 26.25    Types: Cigarettes   Smokeless tobacco: Never  Vaping Use   Vaping Use: Never used  Substance and Sexual Activity   Alcohol use: Yes    Comment: 6-7 drinks a week (alcohol)  Red Bull and Vodka or red wine   Drug use: Never   Sexual activity: Yes    Partners: Male    Birth control/protection: Surgical    Comment: BTL  Other Topics Concern   Not on file  Social History Narrative   Not on file   Social Determinants of Health   Financial Resource Strain: Not on file  Food Insecurity: Not on file  Transportation Needs: Not on file  Physical Activity: Not on file  Stress: Not on file  Social Connections: Not on file     Family History: The patient's family history includes Alzheimer's disease in her maternal grandfather; Cancer (age of onset: 70) in her mother; Heart disease in her father and paternal grandfather; Heart failure in her paternal aunt; Other in her brother, maternal grandfather, paternal grandfather, and paternal grandmother. ROS:   Please see the history of present illness.     All other systems reviewed and are negative.  EKGs/Labs/Other Studies Reviewed:    The following studies were reviewed today:   EKG:    Recent  Labs: 02/05/2021: BUN 17; Creatinine, Ser 0.94; Hemoglobin 13.0; Platelets 251; Potassium 5.4; Sodium 140; TSH 1.150  Recent Lipid Panel No results found for: CHOL, TRIG, HDL, CHOLHDL, VLDL, LDLCALC, LDLDIRECT  Physical Exam: Blood pressure 120/78, pulse 79, height 5\' 8"  (1.727 m), weight 229 lb 12.8 oz (104.2 kg), SpO2 97 %.  GEN:  middle age, moderately obese , very pleasant female  HEENT: Normal NECK: No JVD; No carotid bruits LYMPHATICS: No lymphadenopathy CARDIAC: RRR , no murmurs, rubs, gallops RESPIRATORY:  Clear to auscultation without rales, wheezing or rhonchi  ABDOMEN: Soft, non-tender, non-distended MUSCULOSKELETAL:  No edema; No deformity  SKIN: Warm and dry NEUROLOGIC:  Alert and oriented x 3    ASSESSMENT:    No diagnosis found.  PLAN:       1.   Chest tightness:    She had a normal coronary CT angiogram.  Coronary calcium score is 0.     2.  Palpitations:   much better on the metoprolol  Event monitor revealed sinus rhythm.  She has several nonsustained runs of atrial tachycardia.  These were very brief and only lasted 7 beats at the most.  I have informed her that these are likely due to increased stress.  She needs to work on sleeping better.  She needs to work on smoking cessation and also needs a better diet.  She will continue to work on these issues.      3.  Obesity :  encourage better diet,  exercise program , smoking cessation  Will have one of our nurses discuss Optivia with her     Medication Adjustments/Labs and Tests Ordered: Current medicines are reviewed at length with the patient today.  Concerns regarding medicines are outlined above.  No orders of the defined types were placed in this encounter.  No orders of the defined types were placed in this encounter.     Signed, Mertie Moores, MD  05/13/2021 8:40 PM    [facility]

## 2021-05-13 ENCOUNTER — Other Ambulatory Visit: Payer: Self-pay

## 2021-05-13 ENCOUNTER — Ambulatory Visit (INDEPENDENT_AMBULATORY_CARE_PROVIDER_SITE_OTHER): Payer: 59 | Admitting: Cardiovascular Disease

## 2021-05-13 ENCOUNTER — Encounter: Payer: Self-pay | Admitting: Oncology

## 2021-05-13 ENCOUNTER — Encounter: Payer: Self-pay | Admitting: Cardiovascular Disease

## 2021-05-13 VITALS — BP 120/78 | HR 79 | Ht 68.0 in | Wt 229.8 lb

## 2021-05-13 DIAGNOSIS — R002 Palpitations: Secondary | ICD-10-CM

## 2021-05-13 NOTE — Patient Instructions (Signed)
Medication Instructions:  No changes today *If you need a refill on your cardiac medications before your next appointment, please call your pharmacy*   Lab Work: none   Testing/Procedures: none  Follow-Up: At [facility], you and your health needs are our priority.  As part of our continuing mission to provide you with exceptional heart care, we have created designated Provider Care Teams.  These Care Teams include your primary Cardiologist (physician) and Advanced Practice Providers (APPs -  Physician Assistants and Nurse Practitioners) who all work together to provide you with the care you need, when you need it.   Your next appointment:   12 month(s)  The format for your next appointment:   In Person  Provider:   You may see Mertie Moores, MD or one of the following Advanced Practice Providers on your designated Care Team:   Richardson Dopp, PA-C Robbie Lis, [place]   Other Instructions

## 2021-07-21 ENCOUNTER — Ambulatory Visit (INDEPENDENT_AMBULATORY_CARE_PROVIDER_SITE_OTHER): Payer: 59 | Admitting: Dermatology

## 2021-07-21 ENCOUNTER — Other Ambulatory Visit: Payer: Self-pay

## 2021-07-21 DIAGNOSIS — D1800 Hemangioma unspecified site: Secondary | ICD-10-CM

## 2021-07-21 DIAGNOSIS — L720 Epidermal cyst: Secondary | ICD-10-CM

## 2021-07-21 DIAGNOSIS — L820 Inflamed seborrheic keratosis: Secondary | ICD-10-CM | POA: Diagnosis not present

## 2021-07-21 DIAGNOSIS — Z1283 Encounter for screening for malignant neoplasm of skin: Secondary | ICD-10-CM

## 2021-07-21 DIAGNOSIS — D485 Neoplasm of uncertain behavior of skin: Secondary | ICD-10-CM

## 2021-07-21 DIAGNOSIS — D229 Melanocytic nevi, unspecified: Secondary | ICD-10-CM

## 2021-07-21 DIAGNOSIS — Z86018 Personal history of other benign neoplasm: Secondary | ICD-10-CM

## 2021-07-21 DIAGNOSIS — L821 Other seborrheic keratosis: Secondary | ICD-10-CM

## 2021-07-21 DIAGNOSIS — L814 Other melanin hyperpigmentation: Secondary | ICD-10-CM

## 2021-07-21 DIAGNOSIS — L578 Other skin changes due to chronic exposure to nonionizing radiation: Secondary | ICD-10-CM

## 2021-07-21 NOTE — Progress Notes (Signed)
Follow-Up Visit   Subjective  Andrea [name] is a 50 y.o. female who presents for the following: Annual Exam (Hx of dysplastic nevus - she has noticed a lesion on the R med canthus that she would like treated today ). The patient presents for Total-Body Skin Exam (TBSE) for skin cancer screening and mole check.  The following portions of the chart were reviewed this encounter and updated as appropriate:   Tobacco  Allergies  Meds  Problems  Med Hx  Surg Hx  Fam Hx     Review of Systems:  No other skin or systemic complaints except as noted in HPI or Assessment and Plan.  Objective  Well appearing patient in no apparent distress; mood and affect are within normal limits.  A full examination was performed including scalp, head, eyes, ears, nose, lips, neck, chest, axillae, abdomen, back, buttocks, bilateral upper extremities, bilateral lower extremities, hands, feet, fingers, toes, fingernails, and toenails. All findings within normal limits unless otherwise noted below.  R med canthus x 1, L med thigh x 1 (2) Erythematous keratotic or waxy stuck-on papule or plaque.   L cheek, chin, ear lobe Firm SQ nodules.   R ant thigh Pink biopsy site.   Assessment & Plan  Inflamed seborrheic keratosis R med canthus (R upper eyelid margin) x 1, L med thigh x 1 Destruction of lesion - R med canthus x 1, L med thigh x 1 Complexity: simple   Destruction method: cryotherapy   Informed consent: discussed and consent obtained   Timeout:  patient name, date of birth, surgical site, and procedure verified Lesion destroyed using liquid nitrogen: Yes   Region frozen until ice ball extended beyond lesion: Yes   Outcome: patient tolerated procedure well with no complications   Post-procedure details: wound care instructions given    Epidermal inclusion cyst L cheek, chin, ear lobe Benign-appearing. Exam most consistent with an epidermal inclusion cyst. Discussed that a cyst is a benign  growth that can grow over time and sometimes get irritated or inflamed. Recommend observation if it is not bothersome. Discussed option of surgical excision to remove it if it is growing, symptomatic, or other changes noted. Please call for new or changing lesions so they can be evaluated.  Neoplasm of uncertain behavior of skin R ant thigh Biopsied by PCP - patient to bring [place] pathology results.   Skin cancer screening  Lentigines - Scattered tan macules - Due to sun exposure - Benign-appearing, observe - Recommend daily broad spectrum sunscreen SPF 30+ to sun-exposed areas, reapply every 2 hours as needed. - Call for any changes  Seborrheic Keratoses - Stuck-on, waxy, tan-brown papules and/or plaques  - Benign-appearing - Discussed benign etiology and prognosis. - Observe - Call for any changes  Melanocytic Nevi - Tan-brown and/or pink-flesh-colored symmetric macules and papules - Benign appearing on exam today - Observation - Call clinic for new or changing moles - Recommend daily use of broad spectrum spf 30+ sunscreen to sun-exposed areas.   Hemangiomas - Red papules - Discussed benign nature - Observe - Call for any changes  Actinic Damage - Chronic condition, secondary to cumulative UV/sun exposure - diffuse scaly erythematous macules with underlying dyspigmentation - Recommend daily broad spectrum sunscreen SPF 30+ to sun-exposed areas, reapply every 2 hours as needed.  - Staying in the shade or wearing long sleeves, sun glasses (UVA+UVB protection) and wide brim hats (4-inch brim around the entire circumference of the hat) are also recommended for sun  protection.  - Call for new or changing lesions.  History of Dysplastic Nevus - No evidence of recurrence today - Recommend regular full body skin exams - Recommend daily broad spectrum sunscreen SPF 30+ to sun-exposed areas, reapply every 2 hours as needed.  - Call if any new or changing lesions are noted between  office visits  Skin cancer screening performed today.  Return for surgery - cyst excision of the chin; 1 year TBSE.  Andrea [name], CMA, am acting as scribe for Sarina Ser, MD . Documentation: I have reviewed the above documentation for accuracy and completeness, and I agree with the above.  Sarina Ser, MD

## 2021-07-21 NOTE — Patient Instructions (Signed)

## 2021-07-24 ENCOUNTER — Encounter: Payer: Self-pay | Admitting: Dermatology

## 2021-08-04 ENCOUNTER — Other Ambulatory Visit: Payer: Self-pay

## 2021-08-04 ENCOUNTER — Encounter: Payer: Self-pay | Admitting: Emergency Medicine

## 2021-08-04 ENCOUNTER — Ambulatory Visit
Admission: EM | Admit: 2021-08-04 | Discharge: 2021-08-04 | Disposition: A | Discharge status: Home / Self Care | Payer: 59 | Attending: Emergency Medicine | Admitting: Emergency Medicine

## 2021-08-04 DIAGNOSIS — H6691 Otitis media, unspecified, right ear: Secondary | ICD-10-CM | POA: Diagnosis not present

## 2021-08-04 DIAGNOSIS — H6121 Impacted cerumen, right ear: Secondary | ICD-10-CM

## 2021-08-04 MED ORDER — CEFDINIR 300 MG PO CAPS: 300.0000 mg | ORAL_CAPSULE | Freq: Two times a day (BID) | ORAL | 0 refills | Status: AC

## 2021-08-04 NOTE — ED Provider Notes (Addendum)
Chief Complaint   Chief Complaint  Patient presents with   Otalgia     Subjective, HPI  Andrea [name] is a very pleasant 50 y.o. female who presents with right ear pain and muffled think with concern for cerumen impaction for the last 2 weeks.  Patient does report some pretty consistent right ear pain.  Patient reports that she has been trying to clear her cerumen impaction for quite some time, but does not report having any improvements in her hearing.  No fever, chills or vomiting reported.  Patient's problem list, past medical and social history, medications, and allergies were reviewed by me and updated in Epic.   ROS  See HPI.  Objective   Vitals:   08/04/21 1806  BP: 130/85  Pulse: 73  Resp: 18  Temp: 98.1 F (36.7 C)  SpO2: 95%     General: Appears well-developed and well-nourished. No acute distress.  HEENT Head: Normocephalic and atraumatic. Eyes: Conjunctivae and EOM are normal. No eye drainage or scleral icterus bilaterally.  Ears: Bilateral: Hearing grossly intact. No drainage or visible deformity. No mastoid erythema, edema, or tenderness.  Right: Cerumen impaction noted.  TM erythematous, bulging and opaque. Left: TM WNL. Nose: No nasal deviation. No rhinorrhea.  Mouth/Throat: No stridor or tracheal deviation. No posterior oropharyngeal erythema, edema, or exudate.  Neck: Normal range of motion, neck is supple.  Cardiovascular: Normal rate Pulm/Chest: No respiratory distress. No accessory muscle usage, speaking in full sentences.  Musculoskeletal: No joint deformity, normal range of motion.  Skin: Skin is warm and dry.    Vital signs and nursing note reviewed.    Assessment & Plan  1. Acute right otitis media - cefdinir (OMNICEF) 300 MG capsule; Take 1 capsule (300 mg total) by mouth 2 (two) times daily for 7 days.  Dispense: 14 capsule; Refill: 0  2. Impacted cerumen of right ear  50 y.o. female presents with right ear pain and muffled think  with concern for cerumen impaction for the last 2 weeks.  Patient does report some pretty consistent right ear pain.  Patient reports that she has been trying to clear her cerumen impaction for quite some time, but does not report having any improvements in her hearing.  No fever, chills or vomiting reported.  Cerumen impaction cleared to the right ear with lavage.  The patient does have an erythematous TM suggestive of right otitis media.  Rx'd cefdinir to the patient's preferred pharmacy to use twice daily for the next 7 days.  Advised about home treatment and care to include Tylenol and ibuprofen along with following up with PCP after antibiotics have been completed.  Advised that she may alternate warm and cool compresses to the right ear.  Any worsening of symptoms new high fever, worsening ear pain, redness or swelling behind the ear new or worse discharge from the ear she should return to clinic.  Any dizziness, severe headache or confusion she should go to the ED.  Patient verbalized understanding and agreed with plan.  Patient stable upon discharge.  Return as needed.  Plan:   Discharge Instructions      Follow-up with PCP after course of antibiotics have been completed for reassessment to ensure clearance of infection to right ear.  You may use Tylenol or ibuprofen as needed for fever or pain as long as neither of these medications are contraindicated to any current health conditions. Finish entire prescription of antibiotics.  Do not discontinue taking this medication when you begin  to feel better.  Complete full course. Make sure that you eat with each dose of antibiotics to decrease stomach upset. Eat a daily [place] yogurt or take a daily probiotic to help with stomach upset from antibiotic use. Alternate warm and cool compresses to right ear to help with discomfort. Resting will help the body to fight infection.  Ensure that you receive adequate rest and aim for 8 hours of sleep nightly  while recovering from illness.  If you begin to notice worsening of symptoms such as new high fever, worsening ear pain, redness or swelling behind your ear, new or worse discharge from your ear, difficulty hearing or if you are not beginning to feel better after 2 to 3 days return to clinic for further evaluation.  If you begin to notice dizziness, severe headache or confusion go to the ER for evaluation.          Serafina Royals, [place] 08/04/21 1832    Serafina Royals, [place] 08/08/21 [id number]

## 2021-08-04 NOTE — ED Triage Notes (Signed)
Pt here with right ear pain and and muffling with cerumen impaction x 2 weeks.

## 2021-08-04 NOTE — Discharge Instructions (Addendum)
Follow-up with PCP after course of antibiotics have been completed for reassessment to ensure clearance of infection to right ear.  You may use Tylenol or ibuprofen as needed for fever or pain as long as neither of these medications are contraindicated to any current health conditions. Finish entire prescription of antibiotics.  Do not discontinue taking this medication when you begin to feel better.  Complete full course. Make sure that you eat with each dose of antibiotics to decrease stomach upset. Eat a daily [place] yogurt or take a daily probiotic to help with stomach upset from antibiotic use. Alternate warm and cool compresses to right ear to help with discomfort. Resting will help the body to fight infection.  Ensure that you receive adequate rest and aim for 8 hours of sleep nightly while recovering from illness.  If you begin to notice worsening of symptoms such as new high fever, worsening ear pain, redness or swelling behind your ear, new or worse discharge from your ear, difficulty hearing or if you are not beginning to feel better after 2 to 3 days return to clinic for further evaluation.  If you begin to notice dizziness, severe headache or confusion go to the ER for evaluation.

## 2021-11-18 ENCOUNTER — Other Ambulatory Visit: Payer: Self-pay | Admitting: Internal Medicine

## 2021-11-18 DIAGNOSIS — F17210 Nicotine dependence, cigarettes, uncomplicated: Secondary | ICD-10-CM

## 2021-11-26 ENCOUNTER — Ambulatory Visit
Admission: RE | Admit: 2021-11-26 | Discharge: 2021-11-26 | Disposition: A | Discharge status: Home / Self Care | Payer: 59 | Source: Ambulatory Visit | Attending: Internal Medicine | Admitting: Internal Medicine

## 2021-11-26 DIAGNOSIS — F17210 Nicotine dependence, cigarettes, uncomplicated: Secondary | ICD-10-CM

## 2021-12-16 ENCOUNTER — Encounter: Payer: Self-pay | Admitting: Oncology

## 2021-12-21 ENCOUNTER — Ambulatory Visit: Admit: 2021-12-21 | Discharge: 2021-12-21 | Discharge status: Absent without leave | Payer: 59

## 2021-12-23 ENCOUNTER — Ambulatory Visit: Payer: Managed Care, Other (non HMO) | Admitting: Nurse Practitioner

## 2021-12-31 ENCOUNTER — Encounter: Payer: Self-pay | Admitting: Oncology

## 2021-12-31 ENCOUNTER — Ambulatory Visit (INDEPENDENT_AMBULATORY_CARE_PROVIDER_SITE_OTHER): Payer: 59 | Admitting: Nurse Practitioner

## 2021-12-31 ENCOUNTER — Other Ambulatory Visit: Payer: Self-pay

## 2021-12-31 ENCOUNTER — Encounter: Payer: Self-pay | Admitting: Nurse Practitioner

## 2021-12-31 ENCOUNTER — Other Ambulatory Visit (HOSPITAL_COMMUNITY)
Admission: RE | Admit: 2021-12-31 | Discharge: 2021-12-31 | Disposition: A | Discharge status: Home / Self Care | Payer: 59 | Source: Ambulatory Visit | Attending: Nurse Practitioner | Admitting: Nurse Practitioner

## 2021-12-31 VITALS — BP 140/86 | Ht 66.5 in | Wt 235.0 lb

## 2021-12-31 DIAGNOSIS — Z01419 Encounter for gynecological examination (general) (routine) without abnormal findings: Secondary | ICD-10-CM | POA: Insufficient documentation

## 2021-12-31 DIAGNOSIS — Z87410 Personal history of cervical dysplasia: Secondary | ICD-10-CM | POA: Insufficient documentation

## 2021-12-31 DIAGNOSIS — N951 Menopausal and female climacteric states: Secondary | ICD-10-CM | POA: Diagnosis not present

## 2021-12-31 NOTE — Progress Notes (Signed)
? ?  [name] 10-Mar-1971 [id number] ? ? ?History:  51 y.o. [id number] presents for annual exam. 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 Having mild night sweats, insomnia, and mood changes. Wants to know if she is in menopause. 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  Normal mammogram history. Smoker. HTN, HLD managed by PCP. Has been dealing with GI issues - diverticulitis, c. Diff, hemorrhoids.  ? ?Gynecologic History ?No LMP recorded (lmp unknown). Patient has had a hysterectomy. ?  ?Contraception/Family planning: status post hysterectomy ?Sexually active: Yes ? ?Health Maintenance ?Last Pap: 12/18/2020. Results were: Normal ?Last mammogram: 10/14/2020. Results were: Normal ?Last colonoscopy: 2021. Results were: Normal, 5-year recall ?Last Dexa: Not indicated ? ?Past medical history, past surgical history, family history and social history were all reviewed and documented in the EPIC chart. Married. Works for [employer]. 2 children ages 27 and 71. 21 yo granddaughter. Mother deceased from colon cancer.  ? ?ROS:  A ROS was performed and pertinent positives and negatives are included. ? ?Exam: ? ?Vitals:  ? 12/31/21 1105  ?BP: 140/86  ?Weight: 235 lb (106.6 kg)  ?Height: 5' 6.5" (1.689 m)  ? ? ?Body mass index is 37.36 kg/m?. ? ?General appearance:  Normal ?Thyroid:  Symmetrical, normal in size, without palpable masses or nodularity. ?Respiratory ? Auscultation:  Clear without wheezing or rhonchi ?Cardiovascular ? Auscultation:  Regular rate, without rubs, murmurs or gallops ? Edema/varicosities:  Not grossly evident ?Abdominal ? Soft,nontender, without masses, guarding or rebound. ? Liver/spleen:  No organomegaly noted ? Hernia:  None appreciated ? Skin ? Inspection:  Grossly normal ?  ?Breasts: Examined lying and sitting.  ? Right: Without masses, retractions, discharge or axillary adenopathy. ? ? Left: Without masses,  retractions, discharge or axillary adenopathy. ?Genitourinary  ? Inguinal/mons:  Normal without inguinal adenopathy ? External genitalia:  Normal appearing vulva with no masses, tenderness, or lesions ? BUS/Urethra/Skene's glands:  Normal ? Vagina:  Normal appearing with normal color and discharge, no lesions ? Cervix:  Absent ? Uterus:  Absent ? Adnexa/parametria:   ?  Rt: Normal in size, without masses or tenderness. ?  Lt: Normal in size, without masses or tenderness. ? Anus and perineum: Normal ? Digital rectal exam: Declines ? ?Patient informed chaperone available to be present for breast and pelvic exam. Patient has requested no chaperone to be present. Patient has been advised what will be completed during breast and pelvic exam.  ? ?Assessment/Plan:  51 y.o. [id number] for annual exam.  ? ?Well female exam with routine gynecological exam - Plan: Cytology - PAP( [place]). Education provided on SBEs, importance of preventative screenings, current guidelines, high calcium diet, regular exercise, and multivitamin daily. Labs with PCP.  ? ?Vasomotor symptoms due to menopause - Plan: Estradiol, Follicle stimulating hormone.  ? 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 Pap of vaginal cuff today. If normal we will return to 3-year interval per guidelines.  ? ?Screening for breast cancer - Normal mammogram history. Overdue and plans to call today to schedule. Continue annual screenings.  Normal breast exam today. ? ?Screening for colon cancer - 2021 colonoscopy with 5-year repeat recommended. Mother deceased from colon cancer. Patient with history of bleeding hemorrhoids and diverticulitis. ? ?Follow up in 1 year for annual.  ? ? ? ?[name] [facility], 12:04 PM 12/31/2021 ? ?

## 2022-01-01 LAB — FOLLICLE STIMULATING HORMONE: FSH: 80.8 m[IU]/mL

## 2022-01-01 LAB — ESTRADIOL: Estradiol: <15 pg/mL

## 2022-01-02 LAB — CYTOLOGY - PAP
Comment: NEGATIVE
Diagnosis: NEGATIVE
High risk HPV: NEGATIVE

## 2022-05-28 ENCOUNTER — Encounter: Payer: Self-pay | Admitting: Cardiovascular Disease

## 2022-05-28 NOTE — Progress Notes (Signed)
Cardiology Office Note:    Date:  05/29/2022   ID:  Andrea [name], Andrea [name] 1971-05-07, MRN [id number]  PCP:  Jilda Panda, MD  Cardiologist:  Mertie Moores, MD    Referring MD: Jilda Panda, MD   Chief Complaint  Patient presents with   Palpitations   Problem List   1. Essential hypertension History of chest pain Dizziness   Prior notes:    Andrea [name] is a 51 y.o. female with a hx of Hypertension who presents today for episodes of chest tightness and dizziness.  Andrea [name] is seen for the first time today  She has had some symptoms of diaphoresis on occasion.   Has lightheadedness - has been diagnosed with benign positional vertigo . Occurs randomly  - does not occur regularly . Last year she was exercising more and had lots of dizziness Had stopped smoking , lost 35 lbs But relapsed, restarted smoking in Feb.    Has some episodes of chest tightness.   Has been diagnosied with HTN  - tried [place] - HCT but she had hypotension.     Has   Has been on phentermine for about a year.   September 15, 2017:  Andrea [name] is seen back today for follow-up visit. She was seen several months ago with some chest pain.  A coronary calcium score is 0. Coronary CT angiogram was normal. She was also having some palpitations that were probably related to phentermine. Has had these palps for years.  Has worn a monitir . Still has fleeting pains.  Still has vertigo .  Has been to ENT.   Needs to go again  BP is elevated  Her primary MD has started her on a BP med but she has not picked it up Still smoking  Not exercising yet  Echocardiogram showed normal left ventricular systolic function with EF of 60-65%.  She has grade 1 diastolic dysfunction.  She has trivial aortic insufficiency.  February 05, 2021:  Andrea [name] is seen today for follow up of her CP. Coronary calcium score was 0 . Has some palpitations - which were likely due to phentermine.  Still smoking  - we discussed at  length   Has some intermittant gas pain   Has been exercising ( [employer])  Lost 20 lbs . But she still smokes.  Gets lightheaded toward the end of the boot camp.  She is noticed that her heart rate seems to be a little bit fast.  Even when she is out walking in the park with her husband her Fitbit recorded a heart rate of over 200.  HR will occasionaly spike up to 130s even while she is sitting  Seems to occur after eating    May 13, 2021:  Andrea [name] is seen today for follow up of her palpitations CCA = 0. Still smoking , perhaps 1/2 ppd   Wt is 229 lbs.   Has stopped exercising . Has a new job - still at [employer]   Her palpitations are better on the metoprolol  Sometimes she skips the am dose. She does not eat breakfast   Aug. 4, 2023 Andrea [name] is seen for follow up of her palpitations CAC = 0 Wt is 241 lbs ( up 12 lb)   Still smoking  , less than 1/2 ppd  Screening CT scan shows early COPD   Long discussion about smoking cessation .  We discussed nicotine inhalers.   Has HLD, is not on any lipid meds  Will check lipids today     Past Medical History:  Diagnosis Date   Abnormal Pap smear of cervix    2010-2014 abnormal pap every 3-6 months   dysplastic squamous cells of cervix    Anxiety    occ.- no meds   Blood in stool    Depression    no meds   Diverticulitis 08/2019   DUB (dysfunctional uterine bleeding)    Dyslipidemia    Dyspnea    Resolved - due to anemia - no current problems   Eczema    Endometriosis    Epigastric pain    Fatigue    Fibroid    GERD (gastroesophageal reflux disease)    Grade I diastolic dysfunction 52/84/1324   Noted on ECHO   Hemorrhoid    2nd and 3rd degree   History of adenomatous polyp of colon    History of palpitations    HSV infection    Hx of dysplastic nevus 07/17/2019   L upper back 4.0cm lateral to spine, moderate   Hyperlipidemia    diet controlled - no meds   Hypertension    not currently taking  medication   Infertility, female    Iron deficiency anemia    Last iron infusion 09/14/2018   Left groin hernia    history of   Muscle cramping    with exertion   Obesity    Palpitations    Recurrent bacterial infection    Vaginal   Restless leg    due to anemia   Smoker    SVD (spontaneous vaginal delivery)    x 2   Thoracic back pain    while trying to sleep   Tinnitus, left    hears heart beat, MRI normal   Vertigo 2018    Past Surgical History:  Procedure Laterality Date   ABLATION ON ENDOMETRIOSIS  1995   anemia     due to gyn blood losses from uterine fibroids.  received PRBCs and iron infusions.     CERVICAL BIOPSY  W/ LOOP ELECTRODE EXCISION     CERVICAL CONIZATION W/BX N/A 09/20/2018   Procedure: CONIZATION CERVIX WITH BIOPSY;  Surgeon: Nunzio Cobbs, MD;  Location: [facility];  Service: Gynecology;  Laterality: N/A;   COLONOSCOPY  06/2018   Dr Earlie Raveling.  9 mm sessile, hyperplastic sigmoid polyp.  diverticulosis.  int hemorrhoids.  2 cm cecal lipoma   COLPOSCOPY     x 3   CYSTOSCOPY N/A 11/14/2018   Procedure: CYSTOSCOPY;  Surgeon: Nunzio Cobbs, MD;  Location: [facility];  Service: Gynecology;  Laterality: N/A;   DILATION AND CURETTAGE OF UTERUS  1993   DILATION AND CURETTAGE OF UTERUS N/A 09/20/2018   Procedure: FRACTIONAL DILATATION AND CURETTAGE;  Surgeon: Nunzio Cobbs, MD;  Location: [facility];  Service: Gynecology;  Laterality: N/A;   GYNECOLOGIC CRYOSURGERY     HEMORRHOID BANDING     several   HERNIA REPAIR Left    childhood   INTRAUTERINE DEVICE (IUD) INSERTION  2013    (Mirena)   INTRAUTERINE DEVICE INSERTION  2000   [place]   IUD REMOVAL  2010   IUD REMOVAL  2018   TOTAL LAPAROSCOPIC HYSTERECTOMY WITH SALPINGECTOMY Bilateral 11/14/2018   Procedure: TOTAL LAPAROSCOPIC HYSTERECTOMY WITH BILATERAL SALPINGECTOMY AND LYSIS OF ADHESIONS;  Surgeon: Nunzio Cobbs, MD;   Location: [facility];  Service: Gynecology;  Laterality: Bilateral;  TUBAL LIGATION     2010   UPPER GI ENDOSCOPY  06/2018   x 2   WISDOM TOOTH EXTRACTION      Current Medications: Current Meds  Medication Sig   acetaminophen (TYLENOL) 325 MG tablet Take 2 tablets (650 mg total) by mouth every 6 (six) hours as needed for mild pain (or temp > 100).   EDARBI 40 MG TABS Take 0.5 tablets by mouth daily.   hydrocortisone (ANUSOL-HC) 2.5 % rectal cream    ibuprofen (ADVIL) 200 MG tablet Take 600-800 mg by mouth every 6 (six) hours as needed for moderate pain.   metoprolol tartrate (LOPRESSOR) 25 MG tablet Take 1 tablet (25 mg total) by mouth 2 (two) times daily. (Patient taking differently: Take 25 mg by mouth daily as needed.)   omeprazole (PRILOSEC) 40 MG capsule Take 40 mg by mouth every morning.     Allergies:   Patient has no known allergies.   Social History   Socioeconomic History   Marital status: Married    Spouse name: Not on file   Number of children: 2   Years of education: Not on file   Highest education level: Not on file  Occupational History   Not on file  Tobacco Use   Smoking status: Every Day    Packs/day: 0.75    Years: 35.00    Total pack years: 26.25    Types: Cigarettes   Smokeless tobacco: Never  Vaping Use   Vaping Use: Never used  Substance and Sexual Activity   Alcohol use: Yes    Comment: 4-5 drinks a week (alcohol)  Red Bull and Vodka or red wine   Drug use: Never   Sexual activity: Yes    Partners: Male    Birth control/protection: Surgical    Comment: BTL  Other Topics Concern   Not on file  Social History Narrative   Not on file   Social Determinants of Health   Financial Resource Strain: Not on file  Food Insecurity: Not on file  Transportation Needs: Not on file  Physical Activity: Not on file  Stress: Not on file  Social Connections: Not on file     Family History: The patient's family history includes Alzheimer's disease in  her maternal grandfather; Cancer (age of onset: 56) in her mother; Heart disease in her father and paternal grandfather; Heart failure in her paternal aunt; Other in her brother, maternal grandfather, paternal grandfather, and paternal grandmother. ROS:   Please see the history of present illness.     All other systems reviewed and are negative.  EKGs/Labs/Other Studies Reviewed:    The following studies were reviewed today:   EKG:    Recent Labs: No results found for requested labs within last 365 days.  Recent Lipid Panel No results found for: "CHOL", "TRIG", "HDL", "CHOLHDL", "VLDL", "LDLCALC", "LDLDIRECT"    Physical Exam: Blood pressure 102/68, pulse 78, height '5\' 7"'$  (1.702 m), weight 241 lb 9.6 oz (109.6 kg), SpO2 98 %.  GEN:  middle age female,  moderately obese,  in no acute distress HEENT: Normal NECK: No JVD; No carotid bruits LYMPHATICS: No lymphadenopathy CARDIAC: RRR , no murmurs, rubs, gallops RESPIRATORY:  Clear to auscultation without rales, wheezing or rhonchi  ABDOMEN: Soft, non-tender, non-distended MUSCULOSKELETAL:  No edema; No deformity  SKIN: Warm and dry NEUROLOGIC:  Alert and oriented x 3     ASSESSMENT:    1. Mixed hyperlipidemia   2. Palpitations   3. Aortic calcification (  [place])   4. Morbid obesity ([place])   5. Centrilobular emphysema ([place])     PLAN:       1.   Chest tightness:    She had a normal coronary CT angiogram.  Coronary calcium score is 0.     2.  Palpitations:         3.  Obesity :     Medication Adjustments/Labs and Tests Ordered: Current medicines are reviewed at length with the patient today.  Concerns regarding medicines are outlined above.  Orders Placed This Encounter  Procedures   ALT   Basic metabolic panel   Lipid panel   EKG 12-Lead    No orders of the defined types were placed in this encounter.      Signed, Mertie Moores, MD  05/29/2022 3:42 PM    [place]

## 2022-05-29 ENCOUNTER — Ambulatory Visit (INDEPENDENT_AMBULATORY_CARE_PROVIDER_SITE_OTHER): Payer: 59 | Admitting: Cardiovascular Disease

## 2022-05-29 ENCOUNTER — Encounter: Payer: Self-pay | Admitting: Cardiovascular Disease

## 2022-05-29 VITALS — BP 102/68 | HR 78 | Ht 67.0 in | Wt 241.6 lb

## 2022-05-29 DIAGNOSIS — R002 Palpitations: Secondary | ICD-10-CM | POA: Diagnosis not present

## 2022-05-29 DIAGNOSIS — I700 Atherosclerosis of aorta: Secondary | ICD-10-CM

## 2022-05-29 DIAGNOSIS — J432 Centrilobular emphysema: Secondary | ICD-10-CM

## 2022-05-29 DIAGNOSIS — E782 Mixed hyperlipidemia: Secondary | ICD-10-CM

## 2022-05-29 DIAGNOSIS — E785 Hyperlipidemia, unspecified: Secondary | ICD-10-CM | POA: Insufficient documentation

## 2022-05-29 DIAGNOSIS — J449 Chronic obstructive pulmonary disease, unspecified: Secondary | ICD-10-CM | POA: Insufficient documentation

## 2022-05-29 NOTE — Patient Instructions (Signed)
Medication Instructions:  Your physician recommends that you continue on your current medications as directed. Please refer to the Current Medication list given to you today.  *If you need a refill on your cardiac medications before your next appointment, please call your pharmacy*   Lab Work: LIPDS, ALT, BMET today If you have labs (blood work) drawn today and your tests are completely normal, you will receive your results only by: [place] (if you have MyChart) OR A paper copy in the mail If you have any lab test that is abnormal or we need to change your treatment, we will call you to review the results.   Testing/Procedures: NONE   Follow-Up: At [facility], you and your health needs are our priority.  As part of our continuing mission to provide you with exceptional heart care, we have created designated Provider Care Teams.  These Care Teams include your primary Cardiologist (physician) and Advanced Practice Providers (APPs -  Physician Assistants and Nurse Practitioners) who all work together to provide you with the care you need, when you need it.  We recommend signing up for the patient portal called "MyChart".  Sign up information is provided on this After Visit Summary.  MyChart is used to connect with patients for Virtual Visits (Telemedicine).  Patients are able to view lab/test results, encounter notes, upcoming appointments, etc.  Non-urgent messages can be sent to your provider as well.   To learn more about what you can do with MyChart, go to [email or web address].    Your next appointment:   1 year(s)  The format for your next appointment:   In Person  Provider:   Ronn Melena, or [place] {     Important Information About Sugar

## 2022-05-30 LAB — LIPID PANEL
Chol/HDL Ratio: 6.3 ratio — ABNORMAL HIGH (ref 0.0–4.4)
Cholesterol, Total: 245 mg/dL — ABNORMAL HIGH (ref 100–199)
HDL: 39 mg/dL — ABNORMAL LOW (ref >39)
LDL Chol Calc (NIH): 103 mg/dL — ABNORMAL HIGH (ref 0–99)
Triglycerides: 602 mg/dL (ref 0–149)
VLDL Cholesterol Cal: 103 mg/dL — ABNORMAL HIGH (ref 5–40)

## 2022-05-30 LAB — BASIC METABOLIC PANEL
BUN/Creatinine Ratio: 17 (ref 9–23)
BUN: 15 mg/dL (ref 6–24)
CO2: 23 mmol/L (ref 20–29)
Calcium: 9.8 mg/dL (ref 8.7–10.2)
Chloride: 100 mmol/L (ref 96–106)
Creatinine, Ser: 0.86 mg/dL (ref 0.57–1.00)
Glucose: 102 mg/dL — ABNORMAL HIGH (ref 70–99)
Potassium: 4.7 mmol/L (ref 3.5–5.2)
Sodium: 138 mmol/L (ref 134–144)
eGFR: 81 mL/min/{1.73_m2} (ref >59)

## 2022-05-30 LAB — ALT: ALT: 19 IU/L (ref 0–32)

## 2022-06-01 ENCOUNTER — Telehealth: Payer: Self-pay

## 2022-06-01 MED ORDER — FENOFIBRATE 160 MG PO TABS: 160.0000 mg | ORAL_TABLET | Freq: Every day | ORAL | 3 refills | Status: DC

## 2022-06-01 MED ORDER — FENOFIBRATE 145 MG PO TABS: 145.0000 mg | ORAL_TABLET | Freq: Every day | ORAL | 3 refills | Status: DC

## 2022-06-01 NOTE — Telephone Encounter (Signed)
Called and reviewed results with patient and recommendations. She agrees to plan, medication sent to pharmacy on file.

## 2022-06-01 NOTE — Telephone Encounter (Signed)
-----   Message from Thayer Headings, MD sent at 05/31/2022 10:29 AM EDT ----- Katherene Ponto are very elevated. Lets start fenofibrate 145 mg a day Work on low carb diet, increase exercise

## 2022-07-07 ENCOUNTER — Encounter: Payer: Self-pay | Admitting: Cardiovascular Disease

## 2022-07-10 ENCOUNTER — Other Ambulatory Visit: Payer: Self-pay | Admitting: Internal Medicine

## 2022-07-10 ENCOUNTER — Ambulatory Visit
Admission: RE | Admit: 2022-07-10 | Discharge: 2022-07-10 | Disposition: A | Discharge status: Home / Self Care | Payer: 59 | Source: Ambulatory Visit | Attending: Internal Medicine | Admitting: Internal Medicine

## 2022-07-10 DIAGNOSIS — Z1231 Encounter for screening mammogram for malignant neoplasm of breast: Secondary | ICD-10-CM

## 2022-07-22 ENCOUNTER — Ambulatory Visit: Payer: 59 | Admitting: Dermatology

## 2022-09-14 ENCOUNTER — Ambulatory Visit: Payer: Self-pay | Admitting: Surgery

## 2022-09-14 DIAGNOSIS — K5732 Diverticulitis of large intestine without perforation or abscess without bleeding: Secondary | ICD-10-CM | POA: Insufficient documentation

## 2022-09-14 DIAGNOSIS — K642 Third degree hemorrhoids: Secondary | ICD-10-CM | POA: Insufficient documentation

## 2022-09-30 ENCOUNTER — Encounter: Payer: Self-pay | Admitting: Emergency Medicine

## 2022-09-30 ENCOUNTER — Inpatient Hospital Stay (HOSPITAL_COMMUNITY)
Admit: 2022-09-30 | Discharge: 2022-09-30 | Disposition: A | Discharge status: Home / Self Care | Payer: 59 | Attending: Family Medicine | Admitting: Family Medicine

## 2022-09-30 ENCOUNTER — Other Ambulatory Visit: Payer: Self-pay

## 2022-09-30 ENCOUNTER — Inpatient Hospital Stay
Admission: EM | Admit: 2022-09-30 | Discharge: 2022-10-02 | DRG: 281 | Disposition: A | Discharge status: Home / Self Care | Payer: 59 | Attending: Internal Medicine | Admitting: Internal Medicine

## 2022-09-30 ENCOUNTER — Inpatient Hospital Stay (HOSPITAL_COMMUNITY): Payer: 59

## 2022-09-30 ENCOUNTER — Emergency Department: Payer: 59

## 2022-09-30 DIAGNOSIS — R5383 Other fatigue: Secondary | ICD-10-CM | POA: Diagnosis present

## 2022-09-30 DIAGNOSIS — Z820 Family history of epilepsy and other diseases of the nervous system: Secondary | ICD-10-CM

## 2022-09-30 DIAGNOSIS — Z79899 Other long term (current) drug therapy: Secondary | ICD-10-CM

## 2022-09-30 DIAGNOSIS — G2581 Restless legs syndrome: Secondary | ICD-10-CM | POA: Diagnosis present

## 2022-09-30 DIAGNOSIS — J449 Chronic obstructive pulmonary disease, unspecified: Secondary | ICD-10-CM | POA: Diagnosis present

## 2022-09-30 DIAGNOSIS — R002 Palpitations: Secondary | ICD-10-CM | POA: Diagnosis present

## 2022-09-30 DIAGNOSIS — I110 Hypertensive heart disease with heart failure: Secondary | ICD-10-CM | POA: Diagnosis present

## 2022-09-30 DIAGNOSIS — Z6839 Body mass index (BMI) 39.0-39.9, adult: Secondary | ICD-10-CM

## 2022-09-30 DIAGNOSIS — R0609 Other forms of dyspnea: Secondary | ICD-10-CM | POA: Diagnosis present

## 2022-09-30 DIAGNOSIS — E785 Hyperlipidemia, unspecified: Secondary | ICD-10-CM

## 2022-09-30 DIAGNOSIS — D509 Iron deficiency anemia, unspecified: Secondary | ICD-10-CM | POA: Diagnosis present

## 2022-09-30 DIAGNOSIS — Z90710 Acquired absence of both cervix and uterus: Secondary | ICD-10-CM

## 2022-09-30 DIAGNOSIS — K648 Other hemorrhoids: Secondary | ICD-10-CM | POA: Diagnosis present

## 2022-09-30 DIAGNOSIS — I214 Non-ST elevation (NSTEMI) myocardial infarction: Principal | ICD-10-CM | POA: Diagnosis present

## 2022-09-30 DIAGNOSIS — R079 Chest pain, unspecified: Secondary | ICD-10-CM

## 2022-09-30 DIAGNOSIS — K219 Gastro-esophageal reflux disease without esophagitis: Secondary | ICD-10-CM | POA: Diagnosis present

## 2022-09-30 DIAGNOSIS — I5032 Chronic diastolic (congestive) heart failure: Secondary | ICD-10-CM | POA: Diagnosis present

## 2022-09-30 DIAGNOSIS — F32A Depression, unspecified: Secondary | ICD-10-CM | POA: Diagnosis present

## 2022-09-30 DIAGNOSIS — Z86010 Personal history of colonic polyps: Secondary | ICD-10-CM

## 2022-09-30 DIAGNOSIS — R42 Dizziness and giddiness: Secondary | ICD-10-CM | POA: Diagnosis present

## 2022-09-30 DIAGNOSIS — Z8249 Family history of ischemic heart disease and other diseases of the circulatory system: Secondary | ICD-10-CM

## 2022-09-30 DIAGNOSIS — Z800 Family history of malignant neoplasm of digestive organs: Secondary | ICD-10-CM

## 2022-09-30 DIAGNOSIS — I209 Angina pectoris, unspecified: Secondary | ICD-10-CM | POA: Diagnosis present

## 2022-09-30 DIAGNOSIS — R509 Fever, unspecified: Secondary | ICD-10-CM

## 2022-09-30 DIAGNOSIS — I10 Essential (primary) hypertension: Secondary | ICD-10-CM

## 2022-09-30 DIAGNOSIS — Z1152 Encounter for screening for COVID-19: Secondary | ICD-10-CM

## 2022-09-30 DIAGNOSIS — D175 Benign lipomatous neoplasm of intra-abdominal organs: Secondary | ICD-10-CM | POA: Diagnosis present

## 2022-09-30 DIAGNOSIS — F17210 Nicotine dependence, cigarettes, uncomplicated: Secondary | ICD-10-CM | POA: Diagnosis present

## 2022-09-30 DIAGNOSIS — K5790 Diverticulosis of intestine, part unspecified, without perforation or abscess without bleeding: Secondary | ICD-10-CM | POA: Diagnosis present

## 2022-09-30 DIAGNOSIS — F419 Anxiety disorder, unspecified: Secondary | ICD-10-CM | POA: Diagnosis present

## 2022-09-30 DIAGNOSIS — E782 Mixed hyperlipidemia: Secondary | ICD-10-CM | POA: Diagnosis not present

## 2022-09-30 LAB — CBC WITH DIFFERENTIAL/PLATELET
Abs Immature Granulocytes: 0.03 10*3/uL (ref 0.00–0.07)
Basophils Absolute: 0.1 10*3/uL (ref 0.0–0.1)
Basophils Relative: 1 %
Eosinophils Absolute: 0.3 10*3/uL (ref 0.0–0.5)
Eosinophils Relative: 3 %
HCT: 37.1 % (ref 36.0–46.0)
Hemoglobin: 11.4 g/dL — ABNORMAL LOW (ref 12.0–15.0)
Immature Granulocytes: 0 %
Lymphocytes Relative: 16 %
Lymphs Abs: 1.5 10*3/uL (ref 0.7–4.0)
MCH: 24.8 pg — ABNORMAL LOW (ref 26.0–34.0)
MCHC: 30.7 g/dL (ref 30.0–36.0)
MCV: 80.8 fL (ref 80.0–100.0)
Monocytes Absolute: 0.8 10*3/uL (ref 0.1–1.0)
Monocytes Relative: 8 %
Neutro Abs: 6.9 10*3/uL (ref 1.7–7.7)
Neutrophils Relative %: 72 %
Platelets: 280 10*3/uL (ref 150–400)
RBC: 4.59 MIL/uL (ref 3.87–5.11)
RDW: 14.6 % (ref 11.5–15.5)
WBC: 9.6 10*3/uL (ref 4.0–10.5)
nRBC: 0 % (ref 0.0–0.2)

## 2022-09-30 LAB — PROTIME-INR
INR: 1 (ref 0.8–1.2)
Prothrombin Time: 13.2 seconds (ref 11.4–15.2)

## 2022-09-30 LAB — NM MYOCAR MULTI W/SPECT W/WALL MOTION / EF
Base ST Depression (mm): 0 mm
LV dias vol: 51 mL (ref 46–106)
LV sys vol: 21 mL
Nuc Stress EF: 59 %
Peak HR: 125 {beats}/min
Percent HR: 74 %
Rest HR: 90 {beats}/min
Rest Nuclear Isotope Dose: 10.7 mCi
SDS: 1
SRS: 2
SSS: 1
ST Depression (mm): 0 mm
Stress Nuclear Isotope Dose: 31.8 mCi
TID: 1.14

## 2022-09-30 LAB — ECHOCARDIOGRAM COMPLETE
AR max vel: 2.81 cm2
AV Area VTI: 2.65 cm2
AV Area mean vel: 3 cm2
AV Mean grad: 4 mmHg
AV Peak grad: 8 mmHg
Ao pk vel: 1.41 m/s
Area-P 1/2: 2.97 cm2
Height: 67 in
S' Lateral: 2.5 cm
Weight: 4000 oz

## 2022-09-30 LAB — COMPREHENSIVE METABOLIC PANEL
ALT: 16 U/L (ref 0–44)
AST: 20 U/L (ref 15–41)
Albumin: 4 g/dL (ref 3.5–5.0)
Alkaline Phosphatase: 81 U/L (ref 38–126)
Anion gap: 5 (ref 5–15)
BUN: 16 mg/dL (ref 6–20)
CO2: 26 mmol/L (ref 22–32)
Calcium: 9.1 mg/dL (ref 8.9–10.3)
Chloride: 106 mmol/L (ref 98–111)
Creatinine, Ser: 0.97 mg/dL (ref 0.44–1.00)
GFR, Estimated: >60 mL/min (ref >60)
Glucose, Bld: 112 mg/dL — ABNORMAL HIGH (ref 70–99)
Potassium: 4.2 mmol/L (ref 3.5–5.1)
Sodium: 137 mmol/L (ref 135–145)
Total Bilirubin: 0.7 mg/dL (ref 0.3–1.2)
Total Protein: 7.3 g/dL (ref 6.5–8.1)

## 2022-09-30 LAB — TROPONIN I (HIGH SENSITIVITY)
Troponin I (High Sensitivity): 396 ng/L (ref <18)
Troponin I (High Sensitivity): 347 ng/L (ref <18)

## 2022-09-30 LAB — URINALYSIS, ROUTINE W REFLEX MICROSCOPIC
Bilirubin Urine: NEGATIVE
Glucose, UA: NEGATIVE mg/dL
Hgb urine dipstick: NEGATIVE
Ketones, ur: NEGATIVE mg/dL
Leukocytes,Ua: NEGATIVE
Nitrite: NEGATIVE
Protein, ur: 30 mg/dL — AB
Specific Gravity, Urine: 1.027 (ref 1.005–1.030)
pH: 5 (ref 5.0–8.0)

## 2022-09-30 LAB — HIV ANTIBODY (ROUTINE TESTING W REFLEX): HIV Screen 4th Generation wRfx: NONREACTIVE

## 2022-09-30 LAB — HEPARIN LEVEL (UNFRACTIONATED)
Heparin Unfractionated: 0.14 IU/mL — ABNORMAL LOW (ref 0.30–0.70)
Heparin Unfractionated: 0.28 IU/mL — ABNORMAL LOW (ref 0.30–0.70)

## 2022-09-30 LAB — LIPID PANEL
Cholesterol: 200 mg/dL (ref 0–200)
HDL: 43 mg/dL (ref >40)
LDL Cholesterol: 111 mg/dL — ABNORMAL HIGH (ref 0–99)
Total CHOL/HDL Ratio: 4.7 RATIO
Triglycerides: 229 mg/dL — ABNORMAL HIGH (ref <150)
VLDL: 46 mg/dL — ABNORMAL HIGH (ref 0–40)

## 2022-09-30 LAB — APTT: aPTT: 28 seconds (ref 24–36)

## 2022-09-30 LAB — RESP PANEL BY RT-PCR (FLU A&B, COVID) ARPGX2
Influenza A by PCR: NEGATIVE
Influenza B by PCR: NEGATIVE
SARS Coronavirus 2 by RT PCR: NEGATIVE

## 2022-09-30 MED ORDER — FENOFIBRATE 160 MG PO TABS
160.0000 mg | ORAL_TABLET | Freq: Every day | ORAL | Status: DC
  Administered 2022-09-30 – 2022-10-02 (×2): 160 mg via ORAL
  Filled 2022-09-30 (×3): qty 1

## 2022-09-30 MED ORDER — MORPHINE SULFATE (PF) 2 MG/ML IV SOLN
2.0000 mg | INTRAVENOUS | Status: DC | PRN
  Administered 2022-09-30 – 2022-10-01 (×2): 2 mg via INTRAVENOUS
  Filled 2022-09-30 (×2): qty 1

## 2022-09-30 MED ORDER — PANTOPRAZOLE SODIUM 40 MG PO TBEC
40.0000 mg | DELAYED_RELEASE_TABLET | Freq: Every day | ORAL | Status: DC
  Administered 2022-09-30 – 2022-10-02 (×2): 40 mg via ORAL
  Filled 2022-09-30 (×2): qty 1

## 2022-09-30 MED ORDER — TECHNETIUM TC 99M TETROFOSMIN IV KIT
10.6500 | PACK | Freq: Once | INTRAVENOUS | Status: AC | PRN
  Administered 2022-09-30: 10.65 via INTRAVENOUS

## 2022-09-30 MED ORDER — ASPIRIN 81 MG PO CHEW: 324.0000 mg | CHEWABLE_TABLET | ORAL | Status: DC

## 2022-09-30 MED ORDER — SODIUM CHLORIDE 0.9 % IV SOLN: INTRAVENOUS | Status: DC

## 2022-09-30 MED ORDER — SODIUM CHLORIDE 0.9% FLUSH
3.0000 mL | Freq: Two times a day (BID) | INTRAVENOUS | Status: DC
  Administered 2022-09-30: 3 mL via INTRAVENOUS

## 2022-09-30 MED ORDER — ZOLPIDEM TARTRATE 5 MG PO TABS
5.0000 mg | ORAL_TABLET | Freq: Every day | ORAL | Status: DC
  Administered 2022-09-30: 5 mg via ORAL
  Filled 2022-09-30 (×2): qty 1

## 2022-09-30 MED ORDER — ALPRAZOLAM 0.25 MG PO TABS
0.2500 mg | ORAL_TABLET | Freq: Two times a day (BID) | ORAL | Status: DC | PRN
  Administered 2022-10-01 – 2022-10-02 (×2): 0.25 mg via ORAL
  Filled 2022-09-30 (×2): qty 1

## 2022-09-30 MED ORDER — ASPIRIN 81 MG PO TBEC: 81.0000 mg | DELAYED_RELEASE_TABLET | Freq: Every day | ORAL | Status: DC

## 2022-09-30 MED ORDER — HEPARIN BOLUS VIA INFUSION
1300.0000 [IU] | Freq: Once | INTRAVENOUS | Status: AC
  Administered 2022-10-01: 1300 [IU] via INTRAVENOUS
  Filled 2022-09-30: qty 1300

## 2022-09-30 MED ORDER — REGADENOSON 0.4 MG/5ML IV SOLN
0.4000 mg | Freq: Once | INTRAVENOUS | Status: AC
  Administered 2022-09-30: 0.4 mg via INTRAVENOUS

## 2022-09-30 MED ORDER — HEPARIN BOLUS VIA INFUSION
4000.0000 [IU] | Freq: Once | INTRAVENOUS | Status: AC
  Administered 2022-09-30: 4000 [IU] via INTRAVENOUS
  Filled 2022-09-30: qty 4000

## 2022-09-30 MED ORDER — HEPARIN (PORCINE) 25000 UT/250ML-% IV SOLN
1650.0000 [IU]/h | INTRAVENOUS | Status: DC
  Administered 2022-09-30: 1500 [IU]/h via INTRAVENOUS
  Administered 2022-09-30: 1200 [IU]/h via INTRAVENOUS
  Administered 2022-10-01: 1650 [IU]/h via INTRAVENOUS
  Filled 2022-09-30 (×4): qty 250

## 2022-09-30 MED ORDER — ACETAMINOPHEN 325 MG PO TABS
650.0000 mg | ORAL_TABLET | ORAL | Status: DC | PRN
  Administered 2022-10-01 (×2): 650 mg via ORAL
  Filled 2022-09-30 (×2): qty 2

## 2022-09-30 MED ORDER — ASPIRIN 81 MG PO CHEW
324.0000 mg | CHEWABLE_TABLET | Freq: Once | ORAL | Status: AC
  Administered 2022-09-30: 324 mg via ORAL
  Filled 2022-09-30: qty 4

## 2022-09-30 MED ORDER — TECHNETIUM TC 99M TETROFOSMIN IV KIT
30.0000 | PACK | Freq: Once | INTRAVENOUS | Status: AC | PRN
  Administered 2022-09-30: 31.83 via INTRAVENOUS

## 2022-09-30 MED ORDER — ONDANSETRON HCL 4 MG/2ML IJ SOLN
4.0000 mg | Freq: Four times a day (QID) | INTRAMUSCULAR | Status: DC | PRN
  Administered 2022-09-30: 4 mg via INTRAVENOUS
  Filled 2022-09-30: qty 2

## 2022-09-30 MED ORDER — NITROGLYCERIN 0.4 MG SL SUBL
0.4000 mg | SUBLINGUAL_TABLET | SUBLINGUAL | Status: DC | PRN
  Filled 2022-09-30 (×4): qty 1

## 2022-09-30 MED ORDER — HEPARIN BOLUS VIA INFUSION
2600.0000 [IU] | Freq: Once | INTRAVENOUS | Status: AC
  Administered 2022-09-30: 2600 [IU] via INTRAVENOUS
  Filled 2022-09-30: qty 2600

## 2022-09-30 MED ORDER — ASPIRIN 300 MG RE SUPP: 300.0000 mg | RECTAL | Status: DC

## 2022-09-30 MED ORDER — ATORVASTATIN CALCIUM 80 MG PO TABS
80.0000 mg | ORAL_TABLET | Freq: Every day | ORAL | Status: DC
  Administered 2022-09-30 – 2022-10-01 (×2): 80 mg via ORAL
  Filled 2022-09-30: qty 1
  Filled 2022-09-30: qty 4

## 2022-09-30 MED ORDER — METOPROLOL TARTRATE 25 MG PO TABS
25.0000 mg | ORAL_TABLET | Freq: Two times a day (BID) | ORAL | Status: DC
  Administered 2022-09-30 – 2022-10-02 (×3): 25 mg via ORAL
  Filled 2022-09-30 (×4): qty 1

## 2022-09-30 NOTE — Assessment & Plan Note (Signed)
-   We will continue her antihypertensives. 

## 2022-09-30 NOTE — Assessment & Plan Note (Signed)
-   The patient will be admitted to a progressive unit bed. - We will continue IV heparin. - We will place the patient on high-dose statin therapy. - She will be placed on aspirin as well as as needed sublingual nitroglycerin and IV morphine sulfate for pain. - We will continue beta-blocker therapy with Lopressor. - 2D echo and cardiology consult will be obtained. - I notified Dr. Crissie Sickles about the patient.

## 2022-09-30 NOTE — ED Triage Notes (Signed)
Patient ambulatory to triage with steady gait, without difficulty or distress noted; pt reports [facility] for several days, esp with exertion; noted fever tonight which resolved after taking 3 ibuprofen; denies any cough but st mid CP, nonradiating, increasing when lying down

## 2022-09-30 NOTE — Progress Notes (Signed)
ANTICOAGULATION CONSULT NOTE - Initial Consult  Pharmacy Consult for Heparin  Indication: chest pain/ACS  No Known Allergies  Patient Measurements: Height: '5\' 7"'$  (170.2 cm) Weight: 113.4 kg (250 lb) IBW/kg (Calculated) : 61.6 Heparin Dosing Weight: 87.9 kg   Vital Signs: Temp: 98.6 F (37 C) (12/06 0305) Temp Source: Oral (12/06 0305) BP: 125/64 (12/06 0305) Pulse Rate: 76 (12/06 0305)  Labs: Recent Labs    09/30/22 0308  HGB 11.4*  HCT 37.1  PLT 280  CREATININE 0.97  TROPONINIHS 396*    Estimated Creatinine Clearance: 87.1 mL/min (by C-G formula based on SCr of 0.97 mg/dL).   Medical History: Past Medical History:  Diagnosis Date   Abnormal Pap smear of cervix    2010-2014 abnormal pap every 3-6 months   dysplastic squamous cells of cervix    Anxiety    occ.- no meds   Blood in stool    Depression    no meds   Diverticulitis 08/2019   DUB (dysfunctional uterine bleeding)    Dyslipidemia    Dyspnea    Resolved - due to anemia - no current problems   Eczema    Endometriosis    Epigastric pain    Fatigue    Fibroid    GERD (gastroesophageal reflux disease)    Grade I diastolic dysfunction 50/93/2671   Noted on ECHO   Hemorrhoid    2nd and 3rd degree   History of adenomatous polyp of colon    History of palpitations    HSV infection    Hx of dysplastic nevus 07/17/2019   L upper back 4.0cm lateral to spine, moderate   Hyperlipidemia    diet controlled - no meds   Hypertension    not currently taking medication   Infertility, female    Iron deficiency anemia    Last iron infusion 09/14/2018   Left groin hernia    history of   Muscle cramping    with exertion   Obesity    Palpitations    Recurrent bacterial infection    Vaginal   Restless leg    due to anemia   Smoker    SVD (spontaneous vaginal delivery)    x 2   Thoracic back pain    while trying to sleep   Tinnitus, left    hears heart beat, MRI normal   Vertigo 2018     Medications:  (Not in a hospital admission)   Assessment: Pharmacy consulted to dose heparin in this 51 year old female admitted with ACS/NSTEMI.  No prior anticoag noted. CrCl = 87.1 ml/min  Goal of Therapy:  Heparin level 0.3-0.7 units/ml Monitor platelets by anticoagulation protocol: Yes   Plan:  Give 4000 units bolus x 1 Start heparin infusion at 1200 units/hr Check anti-Xa level in 6 hours and daily while on heparin Continue to monitor H&H and platelets  Allex Lapoint D 09/30/2022,4:06 AM

## 2022-09-30 NOTE — ED Notes (Signed)
Dr. Leonides Schanz made aware of patient critical troponin of 396 at this time

## 2022-09-30 NOTE — Consult Note (Signed)
Cardiology Consultation   Patient ID: Andrea [name] MRN: [id number]; DOB: 11-06-1970  Admit date: 09/30/2022 Date of Consult: 09/30/2022  PCP:  Jilda Panda, [place] [facility] Cardiologist:  Mertie Moores, MD        Patient Profile:   Andrea [name] is a 51 y.o. female with a hx of hypertension, dyslipidemia, fatigue, gastroesophageal reflux disease, history of palpitations, iron deficiency anemia, former smoker (quit 05/2022), family history of early onset CAD, who is being seen 09/30/2022 for the evaluation of chest pain at the request of Dr. Sidney Ace.  History of Present Illness:   Andrea [name] is a 51 year old female with past medical history of hypertension, dyslipidemia, fatigue, gastroesophageal reflux disease, history of palpitations, deficiency anemia, former smoker, and strong family history of early onset CAD.  She was evaluated in November 2018 where she had several months of chest pain.  She underwent a coronary CTA with coronary calcium score of 0.  She had at that time also had complaints of palpitations but had been on phentermine for approximately a year.  Was suffering from vertigo and that was followed by ENT and was then recently started on blood pressure medications.  Echocardiogram revealed normal left ventricular systolic function with an EF of 60-65%, G1 DD and trivial aortic insufficiency.  She was last seen by Dr. Acie Fredrickson on 05/29/2022 following up on her palpitation she continued to smoke less than half a pack per day.  She did undergo screening CT scan that showed only early COPD.  She presents to the [facility] emergency department on 09/30/2022 with complaints of chest pain.  She stated she had anterior chest pressure that started earlier in the night with associated shortness of breath.  She states that the shortness of breath with exertion had been ongoing for several weeks.  She states that she had measured a fever at home at 100.7 and had  chills but no other symptoms.  She did medicate herself with Advil.  She did have diarrhea several days ago that she stated had resolved.  She continued to have chest pressure described as feeling like indigestion and has to burp but states that she does not think it is indigestion because she is anything to eat for several hours.  She does report onset of early coronary artery disease in her family history with an uncle passed away at the age of 48 from a heart attack in her father had a heart attack at the age of 26.  She stated that she is compliant with her blood pressure medications but unfortunately does not take her cholesterol medication.  Initial vitals: Blood pressure 125/64, pulse of 76, respirations of 18, temperature of 98.6  Pertinent labs: Blood glucose 112, hemoglobin 11.4, high-sensitivity troponin of 396 and 347, respiratory panel is negative  Imaging: Chest x-ray revealed heart mediastinal contours within normal limits with no active cardiopulmonary disease  Medications administered in the emergency department with Nitrostat 0.4 mg sublingual, heparin bolus heparin infusion, and aspirin 324 mg   Past Medical History:  Diagnosis Date   Abnormal Pap smear of cervix    2010-2014 abnormal pap every 3-6 months   dysplastic squamous cells of cervix    Anxiety    occ.- no meds   Blood in stool    Depression    no meds   Diverticulitis 08/2019   DUB (dysfunctional uterine bleeding)    Dyslipidemia    Dyspnea    Resolved - due to anemia -  no current problems   Eczema    Endometriosis    Epigastric pain    Fatigue    Fibroid    GERD (gastroesophageal reflux disease)    Grade I diastolic dysfunction 00/86/7619   Noted on ECHO   Hemorrhoid    2nd and 3rd degree   History of adenomatous polyp of colon    History of palpitations    HSV infection    Hx of dysplastic nevus 07/17/2019   L upper back 4.0cm lateral to spine, moderate   Hyperlipidemia    diet controlled - no  meds   Hypertension    not currently taking medication   Infertility, female    Iron deficiency anemia    Last iron infusion 09/14/2018   Left groin hernia    history of   Muscle cramping    with exertion   Obesity    Palpitations    Recurrent bacterial infection    Vaginal   Restless leg    due to anemia   Smoker    SVD (spontaneous vaginal delivery)    x 2   Thoracic back pain    while trying to sleep   Tinnitus, left    hears heart beat, MRI normal   Vertigo 2018    Past Surgical History:  Procedure Laterality Date   ABLATION ON ENDOMETRIOSIS  1995   anemia     due to gyn blood losses from uterine fibroids.  received PRBCs and iron infusions.     CERVICAL BIOPSY  W/ LOOP ELECTRODE EXCISION     CERVICAL CONIZATION W/BX N/A 09/20/2018   Procedure: CONIZATION CERVIX WITH BIOPSY;  Surgeon: Nunzio Cobbs, MD;  Location: [facility];  Service: Gynecology;  Laterality: N/A;   COLONOSCOPY  06/2018   Dr Earlie Raveling.  9 mm sessile, hyperplastic sigmoid polyp.  diverticulosis.  int hemorrhoids.  2 cm cecal lipoma   COLPOSCOPY     x 3   CYSTOSCOPY N/A 11/14/2018   Procedure: CYSTOSCOPY;  Surgeon: Nunzio Cobbs, MD;  Location: [facility];  Service: Gynecology;  Laterality: N/A;   DILATION AND CURETTAGE OF UTERUS  1993   DILATION AND CURETTAGE OF UTERUS N/A 09/20/2018   Procedure: FRACTIONAL DILATATION AND CURETTAGE;  Surgeon: Nunzio Cobbs, MD;  Location: [facility];  Service: Gynecology;  Laterality: N/A;   GYNECOLOGIC CRYOSURGERY     HEMORRHOID BANDING     several   HERNIA REPAIR Left    childhood   INTRAUTERINE DEVICE (IUD) INSERTION  2013    (Mirena)   INTRAUTERINE DEVICE INSERTION  2000   [facility]   IUD REMOVAL  2010   IUD REMOVAL  2018   TOTAL LAPAROSCOPIC HYSTERECTOMY WITH SALPINGECTOMY Bilateral 11/14/2018   Procedure: TOTAL LAPAROSCOPIC HYSTERECTOMY WITH BILATERAL SALPINGECTOMY AND LYSIS OF ADHESIONS;   Surgeon: Nunzio Cobbs, MD;  Location: [facility];  Service: Gynecology;  Laterality: Bilateral;   TUBAL LIGATION     2010   UPPER GI ENDOSCOPY  06/2018   x 2   WISDOM TOOTH EXTRACTION       Home Medications:  Prior to Admission medications   Medication Sig Start Date End Date Taking? Authorizing Provider  EDARBI 40 MG TABS Take 0.5 tablets by mouth daily. 06/09/21  Yes [provider]  metoprolol tartrate (LOPRESSOR) 25 MG tablet Take 1 tablet (25 mg total) by mouth 2 (two) times daily. 02/05/21  Yes Nahser, Arnette Norris  J, MD  omeprazole (PRILOSEC) 40 MG capsule Take 40 mg by mouth every morning.   Yes [provider]  acetaminophen (TYLENOL) 325 MG tablet Take 2 tablets (650 mg total) by mouth every 6 (six) hours as needed for mild pain (or temp > 100). 11/01/19   Meuth, Brooke A, PA-C  fenofibrate (TRICOR) 145 MG tablet Take 1 tablet (145 mg total) by mouth daily. Patient not taking: Reported on 09/30/2022 06/01/22   Nahser, Wonda Cheng, MD  hydrocortisone (ANUSOL-HC) 2.5 % rectal cream     [provider]  ibuprofen (ADVIL) 200 MG tablet Take 600-800 mg by mouth every 6 (six) hours as needed for moderate pain.    [provider]    Inpatient Medications: Scheduled Meds:  [START ON 10/01/2022] aspirin EC  81 mg Oral Daily   atorvastatin  80 mg Oral Daily   fenofibrate  160 mg Oral Daily   metoprolol tartrate  25 mg Oral BID   pantoprazole  40 mg Oral Daily   Continuous Infusions:  sodium chloride 100 mL/hr at 09/30/22 0748   heparin 1,200 Units/hr (09/30/22 0432)   PRN Meds: acetaminophen, ALPRAZolam, morphine injection, nitroGLYCERIN, ondansetron (ZOFRAN) IV  Allergies:   No Known Allergies  Social History:   Social History   Socioeconomic History   Marital status: Married    Spouse name: Not on file   Number of children: 2   Years of education: Not on file   Highest education level: Not on file  Occupational History   Not on file   Tobacco Use   Smoking status: Every Day    Packs/day: 0.75    Years: 35.00    Total pack years: 26.25    Types: Cigarettes   Smokeless tobacco: Never  Vaping Use   Vaping Use: Never used  Substance and Sexual Activity   Alcohol use: Yes    Comment: 4-5 drinks a week (alcohol)  Red Bull and Vodka or red wine   Drug use: Never   Sexual activity: Yes    Partners: Male    Birth control/protection: Surgical    Comment: BTL  Other Topics Concern   Not on file  Social History Narrative   Not on file   Social Determinants of Health   Financial Resource Strain: Not on file  Food Insecurity: No Food Insecurity (09/30/2022)   Hunger Vital Sign    Worried About Running Out of Food in the Last Year: Never true    Ran Out of Food in the Last Year: Never true  Transportation Needs: No Transportation Needs (09/30/2022)   PRAPARE - Hydrologist (Medical): No    Lack of Transportation (Non-Medical): No  Physical Activity: Not on file  Stress: Not on file  Social Connections: Not on file  Intimate Partner Violence: Not At Risk (09/30/2022)   Humiliation, Afraid, Rape, and Kick questionnaire    Fear of Current or Ex-Partner: No    Emotionally Abused: No    Physically Abused: No    Sexually Abused: No    Family History:    Family History  Problem Relation Age of Onset   Cancer Mother 74       Dec.Stage IV colon ca 10/2013--age 57   Heart disease Father    Heart disease Paternal Grandfather    Other Paternal Grandfather        Dec MVA   Other Brother        Dec in a MVA  age 68   Alzheimer's disease Maternal Grandfather        Dec   Other Maternal Grandfather    Other Paternal Grandmother        Dec MVA   Heart failure Paternal Aunt        Stage 4 colon cancer     ROS:  Please see the history of present illness.  Review of Systems  Constitutional:  Positive for chills, fever and malaise/fatigue.  Respiratory:  Positive for shortness of breath.    Cardiovascular:  Positive for chest pain and palpitations.  Gastrointestinal:  Positive for diarrhea.  Neurological:  Positive for weakness.    All other ROS reviewed and negative.     Physical Exam/Data:   Vitals:   09/30/22 0430 09/30/22 0445 09/30/22 0600 09/30/22 0747  BP: 114/70 118/61  125/69  Pulse: 66 72 71 77  Resp: (!) '21 18 15 16  '$ Temp:    97.8 F (36.6 C)  TempSrc:    Oral  SpO2: 97% 98% 100% 99%  Weight:      Height:       No intake or output data in the 24 hours ending 09/30/22 0823    09/30/2022    2:56 AM 05/29/2022    3:08 PM 12/31/2021   11:05 AM  Last 3 Weights  Weight (lbs) 250 lb 241 lb 9.6 oz 235 lb  Weight (kg) 113.399 kg 109.589 kg 106.595 kg     Body mass index is 39.16 kg/m.  General:  Well nourished, well developed, in no acute distress HEENT: normal Neck: no JVD Vascular: No carotid bruits; Distal pulses 2+ bilaterally Cardiac:  normal S1, S2; RRR; no murmur  Lungs:  clear to auscultation bilaterally, no wheezing, rhonchi or rales  Abd: soft, nontender, no hepatomegaly  Ext: no edema Musculoskeletal:  No deformities, BUE and BLE strength normal and equal Skin: warm and dry  Neuro:  CNs 2-12 intact, no focal abnormalities noted Psych:  Normal affect   EKG:  The EKG was personally reviewed and demonstrates: Sinus rhythm rate of 70, left axis deviation, lateral T wave inversion Telemetry:  Telemetry was personally reviewed and demonstrates:  sinus rates of 70-80  Relevant CV Studies: Coronary CTA 2018 1) Calcium score 0   2) Normal right dominant coronary arteries   3) Normal aortic root 3.6 cm  TTE 2018 Left ventricle: The cavity size was normal. There was mild    concentric hypertrophy. Systolic function was normal. The    estimated ejection fraction was in the range of 60% to 65%. Wall    motion was normal; there were no regional wall motion    abnormalities. Doppler parameters are consistent with abnormal    left ventricular  relaxation (grade 1 diastolic dysfunction).    Doppler parameters are consistent with indeterminate ventricular    filling pressure.  - Aortic valve: Transvalvular velocity was within the normal range.    There was no stenosis. There was trivial regurgitation.  - Mitral valve: Transvalvular velocity was within the normal range.    There was no evidence for stenosis. There was no regurgitation.  - Right ventricle: The cavity size was normal. Wall thickness was    normal. Systolic function was normal.  - Atrial septum: No defect or patent foramen ovale was identified    by color flow Doppler.  - Tricuspid valve: There was trivial regurgitation.  - Pulmonary arteries: Systolic pressure was within the normal    range. PA peak pressure:  22 mm Hg (S).    Laboratory Data:  High Sensitivity Troponin:   Recent Labs  Lab 09/30/22 0308 09/30/22 0428  TROPONINIHS 396* 347*     Chemistry Recent Labs  Lab 09/30/22 0308  NA 137  K 4.2  CL 106  CO2 26  GLUCOSE 112*  BUN 16  CREATININE 0.97  CALCIUM 9.1  GFRNONAA >60  ANIONGAP 5    Recent Labs  Lab 09/30/22 0308  PROT 7.3  ALBUMIN 4.0  AST 20  ALT 16  ALKPHOS 81  BILITOT 0.7   Lipids No results for input(s): "CHOL", "TRIG", "HDL", "LABVLDL", "LDLCALC", "CHOLHDL" in the last 168 hours.  Hematology Recent Labs  Lab 09/30/22 0308  WBC 9.6  RBC 4.59  HGB 11.4*  HCT 37.1  MCV 80.8  MCH 24.8*  MCHC 30.7  RDW 14.6  PLT 280   Thyroid No results for input(s): "TSH", "FREET4" in the last 168 hours.  BNPNo results for input(s): "BNP", "PROBNP" in the last 168 hours.  DDimer No results for input(s): "DDIMER" in the last 168 hours.   Radiology/Studies:  DG Chest 1 View  Result Date: 09/30/2022 CLINICAL DATA:  Shortness of breath EXAM: CHEST  1 VIEW COMPARISON:  06/08/2018 FINDINGS: Heart and mediastinal contours are within normal limits. No focal opacities or effusions. No acute bony abnormality. Aortic calcifications.  IMPRESSION: No active cardiopulmonary disease. Electronically Signed   By: Rolm Baptise M.D.   On: 09/30/2022 03:24     Assessment and Plan:   NSTEMI -Presented with complaints of chest discomfort that felt like indigestion that continues this morning but she states has some improvement -strong family history of early onset CAD -High-sensitivity troponins trended 396-> 347 -Started on IV heparin infusion -Changes noted on EKG with T wave inversions in lateral leads -Patient had a coronary CTA in 2018 with a calcium score of 0 -Echocardiogram ordered and pending by primary team -Continued on aspirin, started on atorvastatin and fenofibrate -Continue cardiac monitoring -Stress test ordered and explained to patient  -NPO with meds and sips until testing is completed  Essential hypertension -Blood pressure 125/69 -Continued on metoprolol -Vital signs per unit protocol  Dyslipidemia -Lipid panel ordered for this morning -Continued on atorvastatin and fenofibrate  Palpitations -Continued on metoprolol  Shared Decision Making/Informed Consent The risks [chest pain, shortness of breath, cardiac arrhythmias, dizziness, blood pressure fluctuations, myocardial infarction, stroke/transient ischemic attack, nausea, vomiting, allergic reaction, radiation exposure, metallic taste sensation and life-threatening complications (estimated to be 1 in 10,000)], benefits (risk stratification, diagnosing coronary artery disease, treatment guidance) and alternatives of a nuclear stress test were discussed in detail with Andrea [name] and she agrees to proceed.    Risk Assessment/Risk Scores:     TIMI Risk Score for Unstable Angina or Non-ST Elevation MI:   The patient's TIMI risk score is 3, which indicates a 13% risk of all cause mortality, new or recurrent myocardial infarction or need for urgent revascularization in the next 14 days.   For questions or updates, please contact [place] Please consult [email or web address] for contact info under    Signed, Shelie Lansing, NP  09/30/2022 8:23 AM

## 2022-09-30 NOTE — Progress Notes (Signed)
ANTICOAGULATION CONSULT NOTE - Initial Consult  Pharmacy Consult for Heparin  Indication: chest pain/ACS  No Known Allergies  Patient Measurements: Height: '5\' 7"'$  (170.2 cm) Weight: 113.4 kg (250 lb) IBW/kg (Calculated) : 61.6 Heparin Dosing Weight: 87.9 kg   Vital Signs: Temp: 98.2 F (36.8 C) (12/06 2141) Temp Source: Oral (12/06 2141) BP: 107/66 (12/06 2141) Pulse Rate: 86 (12/06 2141)  Labs: Recent Labs    09/30/22 0308 09/30/22 0428 09/30/22 1442 09/30/22 2154  HGB 11.4*  --   --   --   HCT 37.1  --   --   --   PLT 280  --   --   --   APTT  --  28  --   --   LABPROT  --  13.2  --   --   INR  --  1.0  --   --   HEPARINUNFRC  --   --  0.14* 0.28*  CREATININE 0.97  --   --   --   TROPONINIHS 396* 347*  --   --      Estimated Creatinine Clearance: 87.1 mL/min (by C-G formula based on SCr of 0.97 mg/dL).   Medical History: Past Medical History:  Diagnosis Date   Abnormal Pap smear of cervix    2010-2014 abnormal pap every 3-6 months   dysplastic squamous cells of cervix    Anxiety    occ.- no meds   Blood in stool    Depression    no meds   Diverticulitis 08/2019   DUB (dysfunctional uterine bleeding)    Dyslipidemia    Dyspnea    Resolved - due to anemia - no current problems   Eczema    Endometriosis    Epigastric pain    Fatigue    Fibroid    GERD (gastroesophageal reflux disease)    Grade I diastolic dysfunction 16/07/9603   Noted on ECHO   Hemorrhoid    2nd and 3rd degree   History of adenomatous polyp of colon    History of palpitations    HSV infection    Hx of dysplastic nevus 07/17/2019   L upper back 4.0cm lateral to spine, moderate   Hyperlipidemia    diet controlled - no meds   Hypertension    not currently taking medication   Infertility, female    Iron deficiency anemia    Last iron infusion 09/14/2018   Left groin hernia    history of   Muscle cramping    with exertion   Obesity    Palpitations    Recurrent bacterial  infection    Vaginal   Restless leg    due to anemia   Smoker    SVD (spontaneous vaginal delivery)    x 2   Thoracic back pain    while trying to sleep   Tinnitus, left    hears heart beat, MRI normal   Vertigo 2018    Medications:  (Not in a hospital admission)  Assessment: Pharmacy consulted to dose heparin in this 51 year old female w/ h/o hypertension, dyslipidemia, fatigue, gastroesophageal reflux disease, history of palpitations, iron deficiency anemia, former smoker (quit 05/2022), family history of early onset CAD, who is being undergoing evaluation of chest pain & admitted with ACS/NSTEMI.  No prior anticoag noted. CrCl = 87.1 ml/min  Date Time  HL Rate/Comment 12/06 1442 0.14 Subtherapeutic; 1200 > 1500 un/hr  12/06   2154   0.28     SUBtherapeutic @  1500 units/hr    Goal of Therapy:  Heparin level 0.3-0.7 units/ml Monitor platelets by anticoagulation protocol: Yes   Plan:  12/6:  HL @ 2154 = 0.28, SUBtherapeutic @ 1500 units/hr Will order heparin 1300 units IV X 1 and increase drip rate to 1650 units/hr.  Will recheck HL 6 hrs after rate change.   Giovannina Mun D 09/30/2022,11:35 PM

## 2022-09-30 NOTE — Progress Notes (Signed)
*  PRELIMINARY RESULTS* Echocardiogram 2D Echocardiogram has been performed.  Sherrie Sport 09/30/2022, 10:10 AM

## 2022-09-30 NOTE — Progress Notes (Signed)
ANTICOAGULATION CONSULT NOTE - Initial Consult  Pharmacy Consult for Heparin  Indication: chest pain/ACS  No Known Allergies  Patient Measurements: Height: '5\' 7"'$  (170.2 cm) Weight: 113.4 kg (250 lb) IBW/kg (Calculated) : 61.6 Heparin Dosing Weight: 87.9 kg   Vital Signs: Temp: 97.8 F (36.6 C) (12/06 0747) Temp Source: Oral (12/06 0747) BP: 137/79 (12/06 1100) Pulse Rate: 84 (12/06 1100)  Labs: Recent Labs    09/30/22 0308 09/30/22 0428  HGB 11.4*  --   HCT 37.1  --   PLT 280  --   APTT  --  28  LABPROT  --  13.2  INR  --  1.0  CREATININE 0.97  --   TROPONINIHS 396* 347*     Estimated Creatinine Clearance: 87.1 mL/min (by C-G formula based on SCr of 0.97 mg/dL).   Medical History: Past Medical History:  Diagnosis Date   Abnormal Pap smear of cervix    2010-2014 abnormal pap every 3-6 months   dysplastic squamous cells of cervix    Anxiety    occ.- no meds   Blood in stool    Depression    no meds   Diverticulitis 08/2019   DUB (dysfunctional uterine bleeding)    Dyslipidemia    Dyspnea    Resolved - due to anemia - no current problems   Eczema    Endometriosis    Epigastric pain    Fatigue    Fibroid    GERD (gastroesophageal reflux disease)    Grade I diastolic dysfunction 11/91/4782   Noted on ECHO   Hemorrhoid    2nd and 3rd degree   History of adenomatous polyp of colon    History of palpitations    HSV infection    Hx of dysplastic nevus 07/17/2019   L upper back 4.0cm lateral to spine, moderate   Hyperlipidemia    diet controlled - no meds   Hypertension    not currently taking medication   Infertility, female    Iron deficiency anemia    Last iron infusion 09/14/2018   Left groin hernia    history of   Muscle cramping    with exertion   Obesity    Palpitations    Recurrent bacterial infection    Vaginal   Restless leg    due to anemia   Smoker    SVD (spontaneous vaginal delivery)    x 2   Thoracic back pain    while  trying to sleep   Tinnitus, left    hears heart beat, MRI normal   Vertigo 2018    Medications:  (Not in a hospital admission)  Assessment: Pharmacy consulted to dose heparin in this 51 year old female w/ h/o hypertension, dyslipidemia, fatigue, gastroesophageal reflux disease, history of palpitations, iron deficiency anemia, former smoker (quit 05/2022), family history of early onset CAD, who is being undergoing evaluation of chest pain & admitted with ACS/NSTEMI.  No prior anticoag noted. CrCl = 87.1 ml/min  Date Time  HL Rate/Comment 12/06 1442 0.14 Subtherapeutic; 1200 > 1500 un/hr     Goal of Therapy:  Heparin level 0.3-0.7 units/ml Monitor platelets by anticoagulation protocol: Yes   Plan:  HL subtherapeutic 12/06 1442 '@0'$ .14. Will bolus and increase. Give 2600 units bolus x1; then increase heparin infusion rate to 1500 units/hr Check anti-Xa level in 6 hours after rate change; then daily once consecutively therapeutic Continue to monitor H&H and platelets  Shanon Brow Gerasimos Plotts 09/30/2022,12:35 PM

## 2022-09-30 NOTE — H&P (Signed)
[place]   PATIENT NAME: Andrea [name]    MR#:  [id number]  DATE OF BIRTH:  Andrea [name] 26, 1972  DATE OF ADMISSION:  09/30/2022  PRIMARY CARE PHYSICIAN: Jilda Panda, MD   Patient is coming from: Home  REQUESTING/REFERRING PHYSICIAN: Ward, Delice Bison, DO  CHIEF COMPLAINT:   Chief Complaint  Patient presents with   Chest Pain    HISTORY OF PRESENT ILLNESS:  Andrea [name] is a 51 y.o. Caucasian female with medical history significant for anxiety, depression, dyslipidemia, GERD and hypertension, who presented to the emergency room with acute onset of central chest pain/pressure rated 5/10 in severity that started tonight with associated dyspnea on exertion for the last few days.  She had a measured fever at home of 100.7 for which she took Advil, with associated chills.  No fever here.  No rhinorrhea on exertion with sore throat or earache.  No cough or wheezing or dyspnea.  She had diarrhea several days ago but it resolved.  She admitted to feeling of indigestion with her discomfort and it was radiating to her left neck at some point.  She is also noticed that her heart rate goes from the 80s to 120s with ambulation.  She is followed by [facility].  No nausea or vomiting or abdominal pain.  No dysuria, oliguria or hematuria or flank pain.  Occasional bleeding from her hemorrhoid.  No cough or wheezing.  She stated that she had diarrhea which has resolved.  ED Course: When she came to the ER, vital signs were within normal.  Labs revealed unremarkable CMP.  High sensitive troponin I was 396 and CBC showed hemoglobin 11.4 and hematocrit 37.1.  UA is negative.  Influenza antigens and COVID-19 PCR are currently pending. EKG as reviewed by me : EKG showed normal sinus rhythm with a rate of 70 with left axis deviation and Q waves inferiorly with T wave inversion laterally that are new. Imaging: Portable chest ray showed no acute cardiopulmonary disease.  The patient was given 4 baby aspirin, IV  heparin bolus and drip and sublingual nitroglycerin.  She will be admitted to a progressive unit bed for further evaluation and management. PAST MEDICAL HISTORY:   Past Medical History:  Diagnosis Date   Abnormal Pap smear of cervix    2010-2014 abnormal pap every 3-6 months   dysplastic squamous cells of cervix    Anxiety    occ.- no meds   Blood in stool    Depression    no meds   Diverticulitis 08/2019   DUB (dysfunctional uterine bleeding)    Dyslipidemia    Dyspnea    Resolved - due to anemia - no current problems   Eczema    Endometriosis    Epigastric pain    Fatigue    Fibroid    GERD (gastroesophageal reflux disease)    Grade I diastolic dysfunction 41/93/7902   Noted on ECHO   Hemorrhoid    2nd and 3rd degree   History of adenomatous polyp of colon    History of palpitations    HSV infection    Hx of dysplastic nevus 07/17/2019   L upper back 4.0cm lateral to spine, moderate   Hyperlipidemia    diet controlled - no meds   Hypertension    not currently taking medication   Infertility, female    Iron deficiency anemia    Last iron infusion 09/14/2018   Left groin hernia    history of  Muscle cramping    with exertion   Obesity    Palpitations    Recurrent bacterial infection    Vaginal   Restless leg    due to anemia   Smoker    SVD (spontaneous vaginal delivery)    x 2   Thoracic back pain    while trying to sleep   Tinnitus, left    hears heart beat, MRI normal   Vertigo 2018    PAST SURGICAL HISTORY:   Past Surgical History:  Procedure Laterality Date   ABLATION ON ENDOMETRIOSIS  1995   anemia     due to gyn blood losses from uterine fibroids.  received PRBCs and iron infusions.     CERVICAL BIOPSY  W/ LOOP ELECTRODE EXCISION     CERVICAL CONIZATION W/BX N/A 09/20/2018   Procedure: CONIZATION CERVIX WITH BIOPSY;  Surgeon: Nunzio Cobbs, MD;  Location: [facility];  Service: Gynecology;  Laterality: N/A;    COLONOSCOPY  06/2018   Dr Earlie Raveling.  9 mm sessile, hyperplastic sigmoid polyp.  diverticulosis.  int hemorrhoids.  2 cm cecal lipoma   COLPOSCOPY     x 3   CYSTOSCOPY N/A 11/14/2018   Procedure: CYSTOSCOPY;  Surgeon: Nunzio Cobbs, MD;  Location: [facility];  Service: Gynecology;  Laterality: N/A;   DILATION AND CURETTAGE OF UTERUS  1993   DILATION AND CURETTAGE OF UTERUS N/A 09/20/2018   Procedure: FRACTIONAL DILATATION AND CURETTAGE;  Surgeon: Nunzio Cobbs, MD;  Location: [facility];  Service: Gynecology;  Laterality: N/A;   GYNECOLOGIC CRYOSURGERY     HEMORRHOID BANDING     several   HERNIA REPAIR Left    childhood   INTRAUTERINE DEVICE (IUD) INSERTION  2013    (Mirena)   INTRAUTERINE DEVICE INSERTION  2000   [facility]   IUD REMOVAL  2010   IUD REMOVAL  2018   TOTAL LAPAROSCOPIC HYSTERECTOMY WITH SALPINGECTOMY Bilateral 11/14/2018   Procedure: TOTAL LAPAROSCOPIC HYSTERECTOMY WITH BILATERAL SALPINGECTOMY AND LYSIS OF ADHESIONS;  Surgeon: Nunzio Cobbs, MD;  Location: [facility];  Service: Gynecology;  Laterality: Bilateral;   TUBAL LIGATION     2010   UPPER GI ENDOSCOPY  06/2018   x 2   WISDOM TOOTH EXTRACTION      SOCIAL HISTORY:   Social History   Tobacco Use   Smoking status: Every Day    Packs/day: 0.75    Years: 35.00    Total pack years: 26.25    Types: Cigarettes   Smokeless tobacco: Never  Substance Use Topics   Alcohol use: Yes    Comment: 4-5 drinks a week (alcohol)  Red Bull and Vodka or red wine    FAMILY HISTORY:   Family History  Problem Relation Age of Onset   Cancer Mother 68       Dec.Stage IV colon ca 10/2013--age 26   Heart disease Father    Heart disease Paternal Grandfather    Other Paternal Grandfather        Dec MVA   Other Brother        Dec in a MVA age 17   Alzheimer's disease Maternal Grandfather        Dec   Other Maternal Grandfather    Other Paternal Grandmother        Dec MVA    Heart failure Paternal Aunt        Stage 4 colon  cancer    DRUG ALLERGIES:  No Known Allergies  REVIEW OF SYSTEMS:   ROS As per history of present illness. All pertinent systems were reviewed above. Constitutional, HEENT, cardiovascular, respiratory, GI, GU, musculoskeletal, neuro, psychiatric, endocrine, integumentary and hematologic systems were reviewed and are otherwise negative/unremarkable except for positive findings mentioned above in the HPI.   MEDICATIONS AT HOME:   Prior to Admission medications   Medication Sig Start Date End Date Taking? Authorizing Provider  acetaminophen (TYLENOL) 325 MG tablet Take 2 tablets (650 mg total) by mouth every 6 (six) hours as needed for mild pain (or temp > 100). 11/01/19   Meuth, Brooke A, PA-C  EDARBI 40 MG TABS Take 0.5 tablets by mouth daily. 06/09/21   [provider]  fenofibrate (TRICOR) 145 MG tablet Take 1 tablet (145 mg total) by mouth daily. 06/01/22   Nahser, Wonda Cheng, MD  hydrocortisone (ANUSOL-HC) 2.5 % rectal cream     [provider]  ibuprofen (ADVIL) 200 MG tablet Take 600-800 mg by mouth every 6 (six) hours as needed for moderate pain.    [provider]  metoprolol tartrate (LOPRESSOR) 25 MG tablet Take 1 tablet (25 mg total) by mouth 2 (two) times daily. Patient taking differently: Take 25 mg by mouth daily as needed. 02/05/21   Nahser, Wonda Cheng, MD  omeprazole (PRILOSEC) 40 MG capsule Take 40 mg by mouth every morning.    [provider]  tiZANidine (ZANAFLEX) 4 MG tablet Take 4 mg by mouth every 8 (eight) hours as needed. Patient not taking: Reported on 05/29/2022 04/07/22   [provider]      VITAL SIGNS:  Blood pressure 118/61, pulse 72, temperature 98.6 F (37 C), temperature source Oral, resp. rate 18, height '5\' 7"'$  (1.702 m), weight 113.4 kg, SpO2 98 %.  PHYSICAL EXAMINATION:  Physical Exam  GENERAL:  51 y.o.-year-old Caucasian female patient lying in the bed with no  acute distress.  EYES: Pupils equal, round, reactive to light and accommodation. No scleral icterus. Extraocular muscles intact.  HEENT: Head atraumatic, normocephalic. Oropharynx and nasopharynx clear.  NECK:  Supple, no jugular venous distention. No thyroid enlargement, no tenderness.  LUNGS: Normal breath sounds bilaterally, no wheezing, rales, rhonchi or crepitation. No use of accessory muscles of respiration.  CARDIOVASCULAR: Regular rate and rhythm, S1, S2 normal. No murmurs, rubs, or gallops.  ABDOMEN: Soft, nondistended, nontender. Bowel sounds present. No organomegaly or mass.  EXTREMITIES: No pedal edema, cyanosis, or clubbing.  NEUROLOGIC: Cranial nerves II through XII are intact. Muscle strength 5/5 in all extremities. Sensation intact. Gait not checked.  PSYCHIATRIC: The patient is alert and oriented x 3.  Normal affect and good eye contact. SKIN: No obvious rash, lesion, or ulcer.   LABORATORY PANEL:   CBC Recent Labs  Lab 09/30/22 0308  WBC 9.6  HGB 11.4*  HCT 37.1  PLT 280   ------------------------------------------------------------------------------------------------------------------  Chemistries  Recent Labs  Lab 09/30/22 0308  NA 137  K 4.2  CL 106  CO2 26  GLUCOSE 112*  BUN 16  CREATININE 0.97  CALCIUM 9.1  AST 20  ALT 16  ALKPHOS 81  BILITOT 0.7   ------------------------------------------------------------------------------------------------------------------  Cardiac Enzymes No results for input(s): "TROPONINI" in the last 168 hours. ------------------------------------------------------------------------------------------------------------------  RADIOLOGY:  DG Chest 1 View  Result Date: 09/30/2022 CLINICAL DATA:  Shortness of breath EXAM: CHEST  1 VIEW COMPARISON:  06/08/2018 FINDINGS: Heart and mediastinal contours are within normal limits. No focal opacities or effusions.  No acute bony abnormality. Aortic calcifications. IMPRESSION: No  active cardiopulmonary disease. Electronically Signed   By: Rolm Baptise M.D.   On: 09/30/2022 03:24      IMPRESSION AND PLAN:  Assessment and Plan: * NSTEMI (non-ST elevated myocardial infarction) [facility]) - The patient will be admitted to a progressive unit bed. - We will continue IV heparin. - We will place the patient on high-dose statin therapy. - She will be placed on aspirin as well as as needed sublingual nitroglycerin and IV morphine sulfate for pain. - We will continue beta-blocker therapy with Lopressor. - 2D echo and cardiology consult will be obtained. - I notified Dr. Crissie Sickles about the patient.  Essential hypertension - We will continue her antihypertensives.  Dyslipidemia - We will continue Tricor.  Patient on high-dose statin therapy.  Subjective fever - Her COVID-19 PCR, and influenza antigens are currently pending.  Her UA came back negative.  Her chest x-ray is unremarkable. - We will continue to monitor her temperature here.  GERD without esophagitis - We will continue PPI therapy.   DVT prophylaxis: IV heparin. Advanced Care Planning:  Code Status: full code. Family Communication:  The plan of care was discussed in details with the patient (and family). I answered all questions. The patient agreed to proceed with the above mentioned plan. Further management will depend upon hospital course. Disposition Plan: Back to previous home environment Consults called: Cardiology. All the records are reviewed and case discussed with ED provider.  Status is: Inpatient  At the time of the admission, it appears that the appropriate admission status for this patient is inpatient.  This is judged to be reasonable and necessary in order to provide the required intensity of service to ensure the patient's safety given the presenting symptoms, physical exam findings and initial radiographic and laboratory data in the context of comorbid conditions.  The patient requires  inpatient status due to high intensity of service, high risk of further deterioration and high frequency of surveillance required.  I certify that at the time of admission, it is my clinical judgment that the patient will require inpatient hospital care extending more than 2 midnights.                            Dispo: The patient is from: Home              Anticipated d/c is to: Home              Patient currently is not medically stable to d/c.              Difficult to place patient: No  Christel Mormon M.D on 09/30/2022 at 5:45 AM  [facility]   From 7 PM-7 AM, contact night-coverage [email or web address]  CC: Primary care physician; Jilda Panda, MD

## 2022-09-30 NOTE — Assessment & Plan Note (Signed)
-   We will continue Tricor.  Patient on high-dose statin therapy.

## 2022-09-30 NOTE — Assessment & Plan Note (Signed)
-   Her COVID-19 PCR, and influenza antigens are currently pending.  Her UA came back negative.  Her chest x-ray is unremarkable. - We will continue to monitor her temperature here.

## 2022-09-30 NOTE — Assessment & Plan Note (Signed)
-   We will continue PPI therapy 

## 2022-09-30 NOTE — ED Provider Notes (Signed)
[facility] Provider Note    Event Date/Time   First MD Initiated Contact with Patient 09/30/22 [phone]     (approximate)   History   Chest Pain   HPI  Andrea [name] is a 51 y.o. female with history of hypertension, hyperlipidemia, tobacco use who quit in September with significant family history of CAD who presents to the emergency department with anterior chest pressure that started tonight, shortness of breath with exertion for several days.  States she measured a fever at home of 100.7 and had chills but no headache, ear pain, sore throat, cough, abdominal pain, vomiting.  Did have diarrhea several days ago but this resolved.  No urinary symptoms.  States she continues to have chest pressure and describes it as feeling like indigestion and has to burp but states that she does not think it is indigestion because she has not had anything to eat for several hours.  Reports she had an uncle who passed away at 50 from a heart attack.  Father had a heart attack at 76.  She states she has had a stress test before but has been years ago.  No history of cardiac catheterization.  She is followed by [facility] health cardiology.  She reports compliance with her blood pressure medications but states she does not take her medications for high cholesterol.   History provided by patient.    Past Medical History:  Diagnosis Date   Abnormal Pap smear of cervix    2010-2014 abnormal pap every 3-6 months   dysplastic squamous cells of cervix    Anxiety    occ.- no meds   Blood in stool    Depression    no meds   Diverticulitis 08/2019   DUB (dysfunctional uterine bleeding)    Dyslipidemia    Dyspnea    Resolved - due to anemia - no current problems   Eczema    Endometriosis    Epigastric pain    Fatigue    Fibroid    GERD (gastroesophageal reflux disease)    Grade I diastolic dysfunction 68/34/1962   Noted on ECHO   Hemorrhoid    2nd and 3rd degree   History of  adenomatous polyp of colon    History of palpitations    HSV infection    Hx of dysplastic nevus 07/17/2019   L upper back 4.0cm lateral to spine, moderate   Hyperlipidemia    diet controlled - no meds   Hypertension    not currently taking medication   Infertility, female    Iron deficiency anemia    Last iron infusion 09/14/2018   Left groin hernia    history of   Muscle cramping    with exertion   Obesity    Palpitations    Recurrent bacterial infection    Vaginal   Restless leg    due to anemia   Smoker    SVD (spontaneous vaginal delivery)    x 2   Thoracic back pain    while trying to sleep   Tinnitus, left    hears heart beat, MRI normal   Vertigo 2018    Past Surgical History:  Procedure Laterality Date   ABLATION ON ENDOMETRIOSIS  1995   anemia     due to gyn blood losses from uterine fibroids.  received PRBCs and iron infusions.     CERVICAL BIOPSY  W/ LOOP ELECTRODE EXCISION     CERVICAL CONIZATION W/BX N/A 09/20/2018  Procedure: CONIZATION CERVIX WITH BIOPSY;  Surgeon: Nunzio Cobbs, MD;  Location: [facility];  Service: Gynecology;  Laterality: N/A;   COLONOSCOPY  06/2018   Dr Earlie Raveling.  9 mm sessile, hyperplastic sigmoid polyp.  diverticulosis.  int hemorrhoids.  2 cm cecal lipoma   COLPOSCOPY     x 3   CYSTOSCOPY N/A 11/14/2018   Procedure: CYSTOSCOPY;  Surgeon: Nunzio Cobbs, MD;  Location: [facility];  Service: Gynecology;  Laterality: N/A;   DILATION AND CURETTAGE OF UTERUS  1993   DILATION AND CURETTAGE OF UTERUS N/A 09/20/2018   Procedure: FRACTIONAL DILATATION AND CURETTAGE;  Surgeon: Nunzio Cobbs, MD;  Location: [facility];  Service: Gynecology;  Laterality: N/A;   GYNECOLOGIC CRYOSURGERY     HEMORRHOID BANDING     several   HERNIA REPAIR Left    childhood   INTRAUTERINE DEVICE (IUD) INSERTION  2013    (Mirena)   INTRAUTERINE DEVICE INSERTION  2000   [facility]   IUD REMOVAL   2010   IUD REMOVAL  2018   TOTAL LAPAROSCOPIC HYSTERECTOMY WITH SALPINGECTOMY Bilateral 11/14/2018   Procedure: TOTAL LAPAROSCOPIC HYSTERECTOMY WITH BILATERAL SALPINGECTOMY AND LYSIS OF ADHESIONS;  Surgeon: Nunzio Cobbs, MD;  Location: [facility];  Service: Gynecology;  Laterality: Bilateral;   TUBAL LIGATION     2010   UPPER GI ENDOSCOPY  06/2018   x 2   WISDOM TOOTH EXTRACTION      MEDICATIONS:  Prior to Admission medications   Medication Sig Start Date End Date Taking? Authorizing Provider  acetaminophen (TYLENOL) 325 MG tablet Take 2 tablets (650 mg total) by mouth every 6 (six) hours as needed for mild pain (or temp > 100). 11/01/19   Meuth, Brooke A, PA-C  EDARBI 40 MG TABS Take 0.5 tablets by mouth daily. 06/09/21   [provider]  fenofibrate (TRICOR) 145 MG tablet Take 1 tablet (145 mg total) by mouth daily. 06/01/22   Nahser, Wonda Cheng, MD  hydrocortisone (ANUSOL-HC) 2.5 % rectal cream     [provider]  ibuprofen (ADVIL) 200 MG tablet Take 600-800 mg by mouth every 6 (six) hours as needed for moderate pain.    [provider]  metoprolol tartrate (LOPRESSOR) 25 MG tablet Take 1 tablet (25 mg total) by mouth 2 (two) times daily. Patient taking differently: Take 25 mg by mouth daily as needed. 02/05/21   Nahser, Wonda Cheng, MD  omeprazole (PRILOSEC) 40 MG capsule Take 40 mg by mouth every morning.    [provider]  tiZANidine (ZANAFLEX) 4 MG tablet Take 4 mg by mouth every 8 (eight) hours as needed. Patient not taking: Reported on 05/29/2022 04/07/22   [provider]    Physical Exam   Triage Vital Signs: ED Triage Vitals  Enc Vitals Group     BP 09/30/22 0305 125/64     Pulse Rate 09/30/22 0305 76     Resp 09/30/22 0305 18     Temp 09/30/22 0305 98.6 F (37 C)     Temp Source 09/30/22 0305 Oral     SpO2 09/30/22 0305 97 %     Weight 09/30/22 0256 250 lb (113.4 kg)     Height 09/30/22 0256 '5\' 7"'$  (1.702 m)     Head  Circumference --      Peak Flow --      Pain Score 09/30/22 0256 2  Pain Loc --      Pain Edu? --      Excl. in [place]? --     Most recent vital signs: Vitals:   09/30/22 0305  BP: 125/64  Pulse: 76  Resp: 18  Temp: 98.6 F (37 C)  SpO2: 97%    CONSTITUTIONAL: Alert and oriented and responds appropriately to questions. Well-appearing; well-nourished HEAD: Normocephalic, atraumatic EYES: Conjunctivae clear, pupils appear equal, sclera nonicteric ENT: normal nose; moist mucous membranes NECK: Supple, normal ROM CARD: RRR; S1 and S2 appreciated; no murmurs, no clicks, no rubs, no gallops RESP: Normal chest excursion without splinting or tachypnea; breath sounds clear and equal bilaterally; no wheezes, no rhonchi, no rales, no hypoxia or respiratory distress, speaking full sentences ABD/GI: Normal bowel sounds; non-distended; soft, non-tender, no rebound, no guarding, no peritoneal signs BACK: The back appears normal EXT: Normal ROM in all joints; no deformity noted, no edema; no cyanosis, no calf tenderness or calf swelling SKIN: Normal color for age and race; warm; no rash on exposed skin NEURO: Moves all extremities equally, normal speech PSYCH: The patient's mood and manner are appropriate.   ED Results / Procedures / Treatments   LABS: (all labs ordered are listed, but only abnormal results are displayed) Labs Reviewed  COMPREHENSIVE METABOLIC PANEL - Abnormal; Notable for the following components:      Result Value   Glucose, Bld 112 (*)    All other components within normal limits  CBC WITH DIFFERENTIAL/PLATELET - Abnormal; Notable for the following components:   Hemoglobin 11.4 (*)    MCH 24.8 (*)    All other components within normal limits  TROPONIN I (HIGH SENSITIVITY) - Abnormal; Notable for the following components:   Troponin I (High Sensitivity) 396 (*)    All other components within normal limits  RESP PANEL BY RT-PCR (FLU A&B, COVID) ARPGX2  APTT   PROTIME-INR  URINALYSIS, ROUTINE W REFLEX MICROSCOPIC  TROPONIN I (HIGH SENSITIVITY)     EKG:  EKG Interpretation  Date/Time:  Wednesday September 30 2022 03:12:58 EST Ventricular Rate:  70 PR Interval:  124 QRS Duration: 80 QT Interval:  422 QTC Calculation: 455 R Axis:   -68 Text Interpretation: Normal sinus rhythm Left axis deviation ST & T wave abnormality, consider lateral ischemia Abnormal ECG When compared with ECG of 03-Jun-2017 16:30, PREVIOUS ECG IS PRESENT New lateral T wave inversions since 2018 Confirmed by Madalen Gavin, Cyril Mourning [phone]) on 09/30/2022 3:16:19 AM         RADIOLOGY: My personal review and interpretation of imaging: X-ray clear.  I have personally reviewed all radiology reports.   DG Chest 1 View  Result Date: 09/30/2022 CLINICAL DATA:  Shortness of breath EXAM: CHEST  1 VIEW COMPARISON:  06/08/2018 FINDINGS: Heart and mediastinal contours are within normal limits. No focal opacities or effusions. No acute bony abnormality. Aortic calcifications. IMPRESSION: No active cardiopulmonary disease. Electronically Signed   By: Rolm Baptise M.D.   On: 09/30/2022 03:24     PROCEDURES:  Critical Care performed: Yes, see critical care procedure note(s)   CRITICAL CARE Performed by: Cyril Mourning Annamarie Yamaguchi   Total critical care time: 45 minutes  Critical care time was exclusive of separately billable procedures and treating other patients.  Critical care was necessary to treat or prevent imminent or life-threatening deterioration.  Critical care was time spent personally by me on the following activities: development of treatment plan with patient and/or surrogate as well as nursing, discussions with consultants, evaluation of patient's response  to treatment, examination of patient, obtaining history from patient or surrogate, ordering and performing treatments and interventions, ordering and review of laboratory studies, ordering and review of radiographic studies, pulse  oximetry and re-evaluation of patient's condition.   Procedures    IMPRESSION / MDM / ASSESSMENT AND PLAN / ED COURSE  I reviewed the triage vital signs and the nursing notes.    Patient here with chest pain, shortness of breath and has new EKG changes from 2018.  The patient is on the cardiac monitor to evaluate for evidence of arrhythmia and/or significant heart rate changes.   DIFFERENTIAL DIAGNOSIS (includes but not limited to):   ACS, PE, dissection, pneumonia, COVID, flu, doubt sepsis or bacteremia   Patient's presentation is most consistent with acute presentation with potential threat to life or bodily function.   PLAN: EKG shows new lateral T wave inversions compared to 2018.  Patient has a significant family history of premature cardiac disease and she also has multiple risk factors herself.  Cardiac work-up initiated from triage and patient brought back to room as soon as I was provided with the EKG.  First troponin has come back elevated at 396.  No leukocytosis, normal creatinine and electrolytes.  Chest x-ray reviewed and interpreted by myself and the radiologist that shows no acute abnormality.  COVID, flu swabs pending.  Will admit.  MEDICATIONS GIVEN IN ED: Medications  nitroGLYCERIN (NITROSTAT) SL tablet 0.4 mg (has no administration in time range)  heparin bolus via infusion 4,000 Units (has no administration in time range)  heparin ADULT infusion 100 units/mL (25000 units/250m) (has no administration in time range)  aspirin chewable tablet 324 mg (324 mg Oral Given 09/30/22 0413)     ED COURSE: Hospitalist request that we obtain a urinalysis.  Okay to hold off on blood cultures at this time.   CONSULTS:  Consulted and discussed patient's case with hospitalist, Dr. MSidney Ace  I have recommended admission and consulting physician agrees and will place admission orders.  Patient (and family if present) agree with this plan.   I reviewed all nursing notes, vitals,  pertinent previous records.  All labs, EKGs, imaging ordered have been independently reviewed and interpreted by myself.    OUTSIDE RECORDS REVIEWED: Reviewed patient's last surgical note with Dr. GJohney Mainein 09/14/2022.       FINAL CLINICAL IMPRESSION(S) / ED DIAGNOSES   Final diagnoses:  NSTEMI (non-ST elevated myocardial infarction) ([place]     Rx / DC Orders   ED Discharge Orders     None        Note:  This document was prepared using Dragon voice recognition software and may include unintentional dictation errors.   Taetum Flewellen, KDelice Bison DO 09/30/22 [phone]

## 2022-09-30 NOTE — Progress Notes (Signed)
   Follow Up Note  HPI: 51 year old female with past medical history of hyperlipidemia and hypertension and morbid obesity who presented to the emergency room on the early morning hours of 12/6 with several days of chest discomfort and shortness of breath with mild ambulation which admittedly had been going on for several weeks but increasing in frequency.  EKG was unremarkable but troponins were mildly elevated in the 300s.  Patient not hypoxic and COVID ruled out.  She did note a low-grade temperature prior to coming in.  Patient seen by cardiology and taken for stress test this afternoon noting possible anterior defect.  Plan is for cardiac cath in the morning. Patient seen same day after admission, still emergency room after stress test  Exam: CV: Regular rate and rhythm, S1-S2 Lungs: Clear to auscultation bilaterally  Principal Problem:   NSTEMI (non-ST elevated myocardial infarction) [facility]) Active Problems:   Essential hypertension   Dyslipidemia   Subjective fever   GERD without esophagitis   Chest pain

## 2022-09-30 NOTE — ED Notes (Signed)
Pt ambulatory to and from bathroom independently and without difficulty and back to bed. Pt reconnected to monitoring, WCTM.

## 2022-10-01 ENCOUNTER — Ambulatory Visit: Payer: 59 | Admitting: Dermatology

## 2022-10-01 DIAGNOSIS — I209 Angina pectoris, unspecified: Secondary | ICD-10-CM

## 2022-10-01 LAB — CBC
HCT: 34.1 % — ABNORMAL LOW (ref 36.0–46.0)
Hemoglobin: 10.4 g/dL — ABNORMAL LOW (ref 12.0–15.0)
MCH: 24.9 pg — ABNORMAL LOW (ref 26.0–34.0)
MCHC: 30.5 g/dL (ref 30.0–36.0)
MCV: 81.8 fL (ref 80.0–100.0)
Platelets: 265 10*3/uL (ref 150–400)
RBC: 4.17 MIL/uL (ref 3.87–5.11)
RDW: 15.2 % (ref 11.5–15.5)
WBC: 6.7 10*3/uL (ref 4.0–10.5)
nRBC: 0 % (ref 0.0–0.2)

## 2022-10-01 LAB — BASIC METABOLIC PANEL
Anion gap: 6 (ref 5–15)
BUN: 16 mg/dL (ref 6–20)
CO2: 24 mmol/L (ref 22–32)
Calcium: 8.5 mg/dL — ABNORMAL LOW (ref 8.9–10.3)
Chloride: 110 mmol/L (ref 98–111)
Creatinine, Ser: 0.92 mg/dL (ref 0.44–1.00)
GFR, Estimated: >60 mL/min (ref >60)
Glucose, Bld: 111 mg/dL — ABNORMAL HIGH (ref 70–99)
Potassium: 4.1 mmol/L (ref 3.5–5.1)
Sodium: 140 mmol/L (ref 135–145)

## 2022-10-01 LAB — HEPARIN LEVEL (UNFRACTIONATED)
Heparin Unfractionated: 0.53 IU/mL (ref 0.30–0.70)
Heparin Unfractionated: 0.5 IU/mL (ref 0.30–0.70)

## 2022-10-01 LAB — TROPONIN I (HIGH SENSITIVITY): Troponin I (High Sensitivity): 352 ng/L (ref <18)

## 2022-10-01 MED ORDER — BUPIVACAINE LIPOSOME 1.3 % IJ SUSP
20.0000 mL | Freq: Once | INTRAMUSCULAR | Status: DC
  Filled 2022-10-01: qty 20

## 2022-10-01 MED ORDER — SODIUM CHLORIDE 0.9% FLUSH: 3.0000 mL | INTRAVENOUS | Status: DC | PRN

## 2022-10-01 MED ORDER — ASPIRIN 81 MG PO CHEW
CHEWABLE_TABLET | ORAL | Status: AC
  Administered 2022-10-01: 81 mg via ORAL
  Filled 2022-10-01: qty 1

## 2022-10-01 MED ORDER — SODIUM CHLORIDE 0.9 % IV SOLN: 2.0000 g | INTRAVENOUS | Status: DC

## 2022-10-01 MED ORDER — SODIUM CHLORIDE 0.9 % IV SOLN: 250.0000 mL | INTRAVENOUS | Status: DC | PRN

## 2022-10-01 MED ORDER — CHLORHEXIDINE GLUCONATE CLOTH 2 % EX PADS
6.0000 | MEDICATED_PAD | Freq: Once | CUTANEOUS | Status: DC
  Filled 2022-10-01: qty 6

## 2022-10-01 MED ORDER — ENSURE PRE-SURGERY PO LIQD
296.0000 mL | Freq: Once | ORAL | Status: DC
  Filled 2022-10-01: qty 296

## 2022-10-01 MED ORDER — CELECOXIB 200 MG PO CAPS
200.0000 mg | ORAL_CAPSULE | ORAL | Status: DC
  Filled 2022-10-01: qty 1

## 2022-10-01 MED ORDER — SODIUM CHLORIDE 0.9 % WEIGHT BASED INFUSION: 3.0000 mL/kg/h | INTRAVENOUS | Status: DC

## 2022-10-01 MED ORDER — SODIUM CHLORIDE 0.9 % WEIGHT BASED INFUSION: 1.0000 mL/kg/h | INTRAVENOUS | Status: DC

## 2022-10-01 MED ORDER — ASPIRIN 81 MG PO CHEW: 81.0000 mg | CHEWABLE_TABLET | ORAL | Status: DC

## 2022-10-01 MED ORDER — ASPIRIN 81 MG PO CHEW: 81.0000 mg | CHEWABLE_TABLET | Freq: Once | ORAL | Status: AC

## 2022-10-01 MED ORDER — ALUM & MAG HYDROXIDE-SIMETH 200-200-20 MG/5ML PO SUSP
30.0000 mL | ORAL | Status: DC | PRN
  Administered 2022-10-01: 30 mL via ORAL
  Filled 2022-10-01: qty 30

## 2022-10-01 MED ORDER — ACETAMINOPHEN 500 MG PO TABS: 1000.0000 mg | ORAL_TABLET | ORAL | Status: DC

## 2022-10-01 MED ORDER — GABAPENTIN 300 MG PO CAPS: 300.0000 mg | ORAL_CAPSULE | ORAL | Status: DC

## 2022-10-01 NOTE — Progress Notes (Signed)
[facility] Progress Note  Patient: Andrea [name]    WNU:[id number]  DOA: 09/30/2022    Date of Service: the patient was seen and examined on 10/01/2022  Brief hospital course: 51 year old female with past medical history of hyperlipidemia and hypertension and morbid obesity who presented to the emergency room on the early morning hours of 12/6 with several days of chest discomfort and shortness of breath with mild ambulation which admittedly had been going on for several weeks but increasing in frequency. EKG was unremarkable but troponins were mildly elevated in the 300s. Patient not hypoxic and COVID ruled out. She did note a low-grade temperature prior to coming in. Patient seen by cardiology and taken for stress test this afternoon noting possible anterior defect. Plan is for cardiac cath for 12/7.    Assessment and Plan: * NSTEMI (non-ST elevated myocardial infarction) (HCC) Continue heparin infusion.  Concerns for anterior foci of ischemia noted on stress test.  For cardiac cath today.  Chronic diastolic heart failure Grade 1 diastolic dysfunction noted on echocardiogram.  Currently appears euvolemic  Essential hypertension - We will continue her antihypertensives.  Dyslipidemia - We will continue Tricor.  Patient on high-dose statin therapy, however reports, she has not been taking this medication.  Subjective fever - Her COVID-19 PCR, and influenza antigens are currently pending.  Her UA came back negative.  Her chest x-ray is unremarkable.  We will continue to monitor her temperature here.  GERD without esophagitis - We will continue PPI therapy.  Morbid obesity Meets criteria with BMI greater than 35 and comorbidity of hypertension    Body mass index is 39.16 kg/m.        Consultants: Cardiology  Procedures: Nuclear stress test 12/6 Echocardiogram 12/6 noting preserved ejection fraction and grade 1 diastolic dysfunction Cardiac catheterization  12/7  Antimicrobials: None  Code Status: Full code   Subjective: Slept better last night.  Still some intermittent episodes of chest discomfort  Objective: Vital signs were reviewed and unremarkable. Vitals:   10/01/22 1048 10/01/22 1330  BP:  113/72  Pulse:  87  Resp:  (!) 22  Temp: 97.8 F (36.6 C) 97.9 F (36.6 C)  SpO2:  95%    Intake/Output Summary (Last 24 hours) at 10/01/2022 1445 Last data filed at 10/01/2022 0347 Gross per 24 hour  Intake 1000 ml  Output --  Net 1000 ml   Filed Weights   09/30/22 0256  Weight: 113.4 kg   Body mass index is 39.16 kg/m.  Exam:  General: Alert and oriented x 3, no acute distress HEENT: Normocephalic, atraumatic, mucous membranes are moist Cardiovascular: Regular rate and rhythm, S1-S2 Respiratory: Clear to auscultation bilaterally Abdomen: Soft, nontender, nondistended, positive bowel sounds Musculoskeletal: Clubbing or cyanosis or edema Skin: No skin breaks, tears or lesions Psychiatry: Appropriate, no evidence of psychoses Neurology: No focal deficits  Data Reviewed: Fasting lipid panel notes LDL of 111 and triglycerides of 229.  Hemoglobin of 10.4.  Disposition:  Status is: Inpatient Remains inpatient appropriate because:  -Cardiac catheterization    Anticipated discharge date: 12/8  Family Communication: Daughter in person 12/7 DVT Prophylaxis: SCD's Start: 10/01/22 0242    Author: Annita Brod ,MD 10/01/2022 2:45 PM  To reach On-call, see care teams to locate the attending and reach out via [email or web address]. Between 7PM-7AM, please contact night-coverage If you still have difficulty reaching the attending provider, please page the [facility] (Director on Call) for [facility] on amion for assistance.

## 2022-10-01 NOTE — Progress Notes (Signed)
Progress Note  Patient Name: Andrea [name] Date of Encounter: 10/01/2022  Primary Cardiologist: Nahser  Subjective   Still with dyspnea and chest pain with ambulation to the restroom in the ED. NPO for LHC today. Hemodynamically stable.   Inpatient Medications    Scheduled Meds:  acetaminophen  1,000 mg Oral On Call to OR   [START ON 10/02/2022] aspirin  81 mg Oral Pre-Cath   aspirin EC  81 mg Oral Daily   atorvastatin  80 mg Oral Daily   bupivacaine liposome  20 mL Infiltration Once   celecoxib  200 mg Oral On Call to OR   Chlorhexidine Gluconate Cloth  6 each Topical Once   And   Chlorhexidine Gluconate Cloth  6 each Topical Once   [START ON 10/02/2022] feeding supplement  296 mL Oral Once   fenofibrate  160 mg Oral Daily   gabapentin  300 mg Oral On Call to OR   metoprolol tartrate  25 mg Oral BID   pantoprazole  40 mg Oral Daily   sodium chloride flush  3 mL Intravenous Q12H   zolpidem  5 mg Oral QHS   Continuous Infusions:  sodium chloride Stopped (10/01/22 0833)   sodium chloride     [START ON 10/02/2022] sodium chloride     Followed by   Derrill Memo ON 10/02/2022] sodium chloride     cefTRIAXone (ROCEPHIN)  IV     heparin 1,650 Units/hr (10/01/22 0132)   PRN Meds: sodium chloride, acetaminophen, ALPRAZolam, morphine injection, nitroGLYCERIN, ondansetron (ZOFRAN) IV, sodium chloride flush   Vital Signs    Vitals:   09/30/22 2141 10/01/22 0215 10/01/22 0533 10/01/22 0800  BP: 107/66 (!) 108/57 (!) 109/59 117/66  Pulse: 86 87 84 80  Resp: '20 18 18 '$ (!) 23  Temp: 98.2 F (36.8 C)     TempSrc: Oral     SpO2: 97% 95% 96% 96%  Weight:      Height:        Intake/Output Summary (Last 24 hours) at 10/01/2022 0933 Last data filed at 10/01/2022 1660 Gross per 24 hour  Intake 1000 ml  Output --  Net 1000 ml   Filed Weights   09/30/22 0256  Weight: 113.4 kg    Telemetry    SR - Personally Reviewed  ECG    No new tracings - Personally  Reviewed  Physical Exam   GEN: No acute distress.   Neck: No JVD. Cardiac: RRR, no murmurs, rubs, or gallops.  Respiratory: Clear to auscultation bilaterally.  GI: Soft, nontender, non-distended.   MS: No edema; No deformity. Neuro:  Alert and oriented x 3; Nonfocal.  Psych: Normal affect.  Labs    Chemistry Recent Labs  Lab 09/30/22 0308 10/01/22 0435  NA 137 140  K 4.2 4.1  CL 106 110  CO2 26 24  GLUCOSE 112* 111*  BUN 16 16  CREATININE 0.97 0.92  CALCIUM 9.1 8.5*  PROT 7.3  --   ALBUMIN 4.0  --   AST 20  --   ALT 16  --   ALKPHOS 81  --   BILITOT 0.7  --   GFRNONAA >60 >60  ANIONGAP 5 6     Hematology Recent Labs  Lab 09/30/22 0308 10/01/22 0435  WBC 9.6 6.7  RBC 4.59 4.17  HGB 11.4* 10.4*  HCT 37.1 34.1*  MCV 80.8 81.8  MCH 24.8* 24.9*  MCHC 30.7 30.5  RDW 14.6 15.2  PLT 280 265    Cardiac  EnzymesNo results for input(s): "TROPONINI" in the last 168 hours. No results for input(s): "TROPIPOC" in the last 168 hours.   BNPNo results for input(s): "BNP", "PROBNP" in the last 168 hours.   DDimer No results for input(s): "DDIMER" in the last 168 hours.   Radiology    DG Chest 1 View  Result Date: 09/30/2022 IMPRESSION: No active cardiopulmonary disease. Electronically Signed   By: Rolm Baptise M.D.   On: 09/30/2022 03:24    Cardiac Studies   Lexiscan MPI 09/30/2022:   No ST deviation was noted.   LV perfusion is abnormal. Defect 1: There is a medium defect with moderate reduction in uptake present in the mid to basal anterior location(s) that is reversible. There is normal wall motion in the defect area. Consistent with ischemia.   Left ventricular function is normal visually, calculated EF is 46%. correlation with echo advised.   No significant coronary artery calcifications noted. __________  2D echo 09/30/2022: 1. Left ventricular ejection fraction, by estimation, is 60 to 65%. The  left ventricle has normal function. The left ventricle has  no regional  wall motion abnormalities. There is moderate left ventricular hypertrophy.  Left ventricular diastolic  parameters are consistent with Grade I diastolic dysfunction (impaired  relaxation).   2. Right ventricular systolic function is normal. The right ventricular  size is not well visualized.   3. The mitral valve is grossly normal. No evidence of mitral valve  regurgitation.   4. The aortic valve was not well visualized. Aortic valve regurgitation  is not visualized.   Patient Profile     51 y.o. female with history of hypertension, dyslipidemia, fatigue, gastroesophageal reflux disease, history of palpitations, iron deficiency anemia, former smoker (quit 05/2022), family history of early onset CAD, who is being seen for NSTEMI and abnormal Lexiscan MPI.   Assessment & Plan    1. NSTEMI: -Continues to note dyspnea and angina with ambulation to the restroom in the ED -HS-Tn peaked at 396, down trending -Lexiscan MPI abnormal yesterday as above -EF normal with normal wall motion by echo this admission  -NPO for LHC today -Heparin gtt -ASA -Lipitor, Lopressor -Further recommendations pending LHC  2. HTN: -Blood pressure well controlled -Lopressor  3. HLD: -LDL 111 -Now on Lipitor 80 mg -PTA fenofibrate    Shared Decision Making/Informed Consent{  The risks [stroke (1 in 1000), death (1 in 1000), kidney failure [usually temporary] (1 in 500), bleeding (1 in 200), allergic reaction [possibly serious] (1 in 200)], benefits (diagnostic support and management of coronary artery disease) and alternatives of a cardiac catheterization were discussed in detail with Ms. Southgate and she is willing to proceed.   For questions or updates, please contact [place] Please consult [email or web address] for contact info under Cardiology/STEMI.    Signed, Christell Faith, PA-C [place] Pager: [phone] 10/01/2022, 9:33 AM

## 2022-10-01 NOTE — Progress Notes (Signed)
ANTICOAGULATION CONSULT NOTE - Initial Consult  Pharmacy Consult for Heparin  Indication: chest pain/ACS  No Known Allergies  Patient Measurements: Height: '5\' 7"'$  (170.2 cm) Weight: 113.4 kg (250 lb) IBW/kg (Calculated) : 61.6 Heparin Dosing Weight: 87.9 kg   Vital Signs: BP: 122/71 (12/07 1000) Pulse Rate: 86 (12/07 1000)  Labs: Recent Labs    09/30/22 0308 09/30/22 0428 09/30/22 1442 09/30/22 2154 10/01/22 0435 10/01/22 0823  HGB 11.4*  --   --   --  10.4*  --   HCT 37.1  --   --   --  34.1*  --   PLT 280  --   --   --  265  --   APTT  --  28  --   --   --   --   LABPROT  --  13.2  --   --   --   --   INR  --  1.0  --   --   --   --   HEPARINUNFRC  --   --  0.14* 0.28*  --  0.53  CREATININE 0.97  --   --   --  0.92  --   TROPONINIHS 396* 347*  --   --   --   --      Estimated Creatinine Clearance: 91.9 mL/min (by C-G formula based on SCr of 0.92 mg/dL).   Medical History: Past Medical History:  Diagnosis Date   Abnormal Pap smear of cervix    2010-2014 abnormal pap every 3-6 months   dysplastic squamous cells of cervix    Anxiety    occ.- no meds   Blood in stool    Depression    no meds   Diverticulitis 08/2019   DUB (dysfunctional uterine bleeding)    Dyslipidemia    Dyspnea    Resolved - due to anemia - no current problems   Eczema    Endometriosis    Epigastric pain    Fatigue    Fibroid    GERD (gastroesophageal reflux disease)    Grade I diastolic dysfunction 16/10/930   Noted on ECHO   Hemorrhoid    2nd and 3rd degree   History of adenomatous polyp of colon    History of palpitations    HSV infection    Hx of dysplastic nevus 07/17/2019   L upper back 4.0cm lateral to spine, moderate   Hyperlipidemia    diet controlled - no meds   Hypertension    not currently taking medication   Infertility, female    Iron deficiency anemia    Last iron infusion 09/14/2018   Left groin hernia    history of   Muscle cramping    with exertion    Obesity    Palpitations    Recurrent bacterial infection    Vaginal   Restless leg    due to anemia   Smoker    SVD (spontaneous vaginal delivery)    x 2   Thoracic back pain    while trying to sleep   Tinnitus, left    hears heart beat, MRI normal   Vertigo 2018    Medications:  (Not in a hospital admission)  Assessment: Pharmacy consulted to dose heparin in this 51 year old female w/ h/o hypertension, dyslipidemia, fatigue, gastroesophageal reflux disease, history of palpitations, iron deficiency anemia, former smoker (quit 05/2022), family history of early onset CAD, who is being undergoing evaluation of chest pain & admitted with ACS/NSTEMI.  No prior anticoag noted. CrCl = 87.1 ml/min  Date Time  HL Rate/Comment 12/06 1442 0.14 Subtherapeutic; 1200 > 1500 un/hr  12/06   2154    0.28    SUBtherapeutic @ 1500>1650 un/hr  12/07 0823 0.53 Therapeutic x1; 1650 un/hr  Goal of Therapy:  Heparin level 0.3-0.7 units/ml Monitor platelets by anticoagulation protocol: Yes   Plan:  12/7:  HL @ 0823 = 0.53, therapeutic x1 @ 1650 units/hr Continue heparin infusion at 1650 units/hr.  Will check confirmatory HL in 6 hrs to assess rate. CTM CBC daily while on heparin gtt   Shanon Brow Natale Barba 10/01/2022,10:44 AM

## 2022-10-01 NOTE — ED Notes (Signed)
Was advised by night shift that the patient is not to get her medications until about an hour prior to surgery.

## 2022-10-01 NOTE — Progress Notes (Signed)
ANTICOAGULATION CONSULT NOTE - Initial Consult  Pharmacy Consult for Heparin  Indication: chest pain/ACS  No Known Allergies  Patient Measurements: Height: '5\' 7"'$  (170.2 cm) Weight: 113.4 kg (250 lb) IBW/kg (Calculated) : 61.6 Heparin Dosing Weight: 87.9 kg   Vital Signs: Temp: 98.3 F (36.8 C) (12/07 1514) Temp Source: Oral (12/07 1514) BP: 143/80 (12/07 1514) Pulse Rate: 87 (12/07 1514)  Labs: Recent Labs    09/30/22 0308 09/30/22 0428 09/30/22 1442 09/30/22 2154 10/01/22 0435 10/01/22 0823 10/01/22 1335  HGB 11.4*  --   --   --  10.4*  --   --   HCT 37.1  --   --   --  34.1*  --   --   PLT 280  --   --   --  265  --   --   APTT  --  28  --   --   --   --   --   LABPROT  --  13.2  --   --   --   --   --   INR  --  1.0  --   --   --   --   --   HEPARINUNFRC  --   --    < > 0.28*  --  0.53 0.50  CREATININE 0.97  --   --   --  0.92  --   --   TROPONINIHS 396* 347*  --   --   --   --   --    < > = values in this interval not displayed.     Estimated Creatinine Clearance: 91.9 mL/min (by C-G formula based on SCr of 0.92 mg/dL).   Medical History: Past Medical History:  Diagnosis Date   Abnormal Pap smear of cervix    2010-2014 abnormal pap every 3-6 months   dysplastic squamous cells of cervix    Anxiety    occ.- no meds   Blood in stool    Depression    no meds   Diverticulitis 08/2019   DUB (dysfunctional uterine bleeding)    Dyslipidemia    Dyspnea    Resolved - due to anemia - no current problems   Eczema    Endometriosis    Epigastric pain    Fatigue    Fibroid    GERD (gastroesophageal reflux disease)    Grade I diastolic dysfunction 96/78/9381   Noted on ECHO   Hemorrhoid    2nd and 3rd degree   History of adenomatous polyp of colon    History of palpitations    HSV infection    Hx of dysplastic nevus 07/17/2019   L upper back 4.0cm lateral to spine, moderate   Hyperlipidemia    diet controlled - no meds   Hypertension    not currently  taking medication   Infertility, female    Iron deficiency anemia    Last iron infusion 09/14/2018   Left groin hernia    history of   Muscle cramping    with exertion   Obesity    Palpitations    Recurrent bacterial infection    Vaginal   Restless leg    due to anemia   Smoker    SVD (spontaneous vaginal delivery)    x 2   Thoracic back pain    while trying to sleep   Tinnitus, left    hears heart beat, MRI normal   Vertigo 2018    Medications:  Medications  Prior to Admission  Medication Sig Dispense Refill Last Dose   EDARBI 40 MG TABS Take 0.5 tablets by mouth daily.   09/29/2022 at 0800   metoprolol tartrate (LOPRESSOR) 25 MG tablet Take 1 tablet (25 mg total) by mouth 2 (two) times daily. 180 tablet 3 09/29/2022 at 2200   omeprazole (PRILOSEC) 40 MG capsule Take 40 mg by mouth every morning.   09/29/2022 at 0800   acetaminophen (TYLENOL) 325 MG tablet Take 2 tablets (650 mg total) by mouth every 6 (six) hours as needed for mild pain (or temp > 100).   prn at prn   fenofibrate (TRICOR) 145 MG tablet Take 1 tablet (145 mg total) by mouth daily. (Patient not taking: Reported on 09/30/2022) 90 tablet 3 Not Taking   hydrocortisone (ANUSOL-HC) 2.5 % rectal cream    prn at prn   ibuprofen (ADVIL) 200 MG tablet Take 600-800 mg by mouth every 6 (six) hours as needed for moderate pain.   prn at prn  Heparin Dosing Weight: 87.9 kg   Assessment: Pharmacy consulted to dose heparin in this 51 year old female w/ h/o hypertension, dyslipidemia, fatigue, gastroesophageal reflux disease, history of palpitations, iron deficiency anemia, former smoker (quit 05/2022), family history of early onset CAD, who is being undergoing evaluation of chest pain & admitted with ACS/NSTEMI.  No prior anticoag noted. CrCl = 87.1 ml/min  Date Time  HL Rate/Comment 12/06 1442 0.14 Subtherapeutic; 1200 > 1500 un/hr  12/06   2154    0.28    SUBtherapeutic @ 1500>1650 un/hr  12/07 0823 0.53 Therapeutic x1; 1650  un/hr 12/07 1335 0.5 Therapeutic x2; 1650 un/hr (collected early prior to transfer OTF to cath lab, if continued would repeat level x1 to confirm)  Goal of Therapy:  Heparin level 0.3-0.7 units/ml Monitor platelets by anticoagulation protocol: Yes   Plan:  12/7:  HL @ 1335 = 0.5, therapeutic x2 @ 1650 units/hr, however  the level was collected early prior to transfer OTF to cath lab, if continued would repeat level x1 to confirm at a normal 6hr interval. Continue heparin infusion at 1650 units/hr.  Will check confirmatory HL in 6 hrs to assess rate. CTM CBC daily while on heparin gtt   Lorna Dibble 10/01/2022,4:13 PM

## 2022-10-01 NOTE — Progress Notes (Signed)
Dr. Ellyn Hack speaking with pt. And her spouse re: delay of cath until tomorrow AM.Both verbalize understanding of situation.

## 2022-10-01 NOTE — Progress Notes (Signed)
ANTICOAGULATION CONSULT NOTE - Initial Consult  Pharmacy Consult for Heparin  Indication: chest pain/ACS  No Known Allergies  Patient Measurements: Height: '5\' 7"'$  (170.2 cm) Weight: 113.4 kg (250 lb) IBW/kg (Calculated) : 61.6 Heparin Dosing Weight: 87.9 kg   Vital Signs: Temp: 98.2 F (36.8 C) (12/07 1754) Temp Source: Oral (12/07 1754) BP: 129/64 (12/07 1754) Pulse Rate: 95 (12/07 1754)  Labs: Recent Labs    09/30/22 0308 09/30/22 0428 09/30/22 1442 09/30/22 2154 10/01/22 0435 10/01/22 0823 10/01/22 1335  HGB 11.4*  --   --   --  10.4*  --   --   HCT 37.1  --   --   --  34.1*  --   --   PLT 280  --   --   --  265  --   --   APTT  --  28  --   --   --   --   --   LABPROT  --  13.2  --   --   --   --   --   INR  --  1.0  --   --   --   --   --   HEPARINUNFRC  --   --    < > 0.28*  --  0.53 0.50  CREATININE 0.97  --   --   --  0.92  --   --   TROPONINIHS 396* 347*  --   --   --   --   --    < > = values in this interval not displayed.     Estimated Creatinine Clearance: 91.9 mL/min (by C-G formula based on SCr of 0.92 mg/dL).   Medical History: Past Medical History:  Diagnosis Date   Abnormal Pap smear of cervix    2010-2014 abnormal pap every 3-6 months   dysplastic squamous cells of cervix    Anxiety    occ.- no meds   Blood in stool    Depression    no meds   Diverticulitis 08/2019   DUB (dysfunctional uterine bleeding)    Dyslipidemia    Dyspnea    Resolved - due to anemia - no current problems   Eczema    Endometriosis    Epigastric pain    Fatigue    Fibroid    GERD (gastroesophageal reflux disease)    Grade I diastolic dysfunction 77/41/2878   Noted on ECHO   Hemorrhoid    2nd and 3rd degree   History of adenomatous polyp of colon    History of palpitations    HSV infection    Hx of dysplastic nevus 07/17/2019   L upper back 4.0cm lateral to spine, moderate   Hyperlipidemia    diet controlled - no meds   Hypertension    not currently  taking medication   Infertility, female    Iron deficiency anemia    Last iron infusion 09/14/2018   Left groin hernia    history of   Muscle cramping    with exertion   Obesity    Palpitations    Recurrent bacterial infection    Vaginal   Restless leg    due to anemia   Smoker    SVD (spontaneous vaginal delivery)    x 2   Thoracic back pain    while trying to sleep   Tinnitus, left    hears heart beat, MRI normal   Vertigo 2018    Medications:  Medications  Prior to Admission  Medication Sig Dispense Refill Last Dose   EDARBI 40 MG TABS Take 0.5 tablets by mouth daily.   09/29/2022 at 0800   metoprolol tartrate (LOPRESSOR) 25 MG tablet Take 1 tablet (25 mg total) by mouth 2 (two) times daily. 180 tablet 3 09/29/2022 at 2200   omeprazole (PRILOSEC) 40 MG capsule Take 40 mg by mouth every morning.   09/29/2022 at 0800   acetaminophen (TYLENOL) 325 MG tablet Take 2 tablets (650 mg total) by mouth every 6 (six) hours as needed for mild pain (or temp > 100).   prn at prn   fenofibrate (TRICOR) 145 MG tablet Take 1 tablet (145 mg total) by mouth daily. (Patient not taking: Reported on 09/30/2022) 90 tablet 3 Not Taking   hydrocortisone (ANUSOL-HC) 2.5 % rectal cream    prn at prn   ibuprofen (ADVIL) 200 MG tablet Take 600-800 mg by mouth every 6 (six) hours as needed for moderate pain.   prn at prn  Heparin Dosing Weight: 87.9 kg   Assessment: Pharmacy consulted to dose heparin in this 51 year old female w/ h/o hypertension, dyslipidemia, fatigue, gastroesophageal reflux disease, history of palpitations, iron deficiency anemia, former smoker (quit 05/2022), family history of early onset CAD, who is being undergoing evaluation of chest pain & admitted with ACS/NSTEMI.  No prior anticoag noted. CrCl = 87.1 ml/min  Date Time  HL Rate/Comment 12/06 1442 0.14 Subtherapeutic; 1200 > 1500 un/hr  12/06   2154    0.28    SUBtherapeutic @ 1500>1650 un/hr  12/07 0823 0.53 Therapeutic x1; 1650  un/hr 12/07 1335 0.5 Therapeutic x2; 1650 un/hr (collected early prior to transfer OTF to cath lab, if continued would repeat level x1 to confirm)  Goal of Therapy:  Heparin level 0.3-0.7 units/ml Monitor platelets by anticoagulation protocol: Yes   Plan: Per RN, catheterization postponed.  Restart heparin at previous rate of 1650 units/hr. Continue heparin infusion at 1650 units/hr.  Will check HL in 6 hrs to assess rate. CTM CBC daily while on heparin gtt   Wynelle Cleveland 10/01/2022,6:13 PM

## 2022-10-01 NOTE — Progress Notes (Signed)
Pt arrived from cath lab at approx 1755.  VSS, SO at the bedside.  Patient oriented to room, call bell.  Plan for cardiac cath in the AM.  RCD call from cath lab RN that hep gtt needs to be restarted.  Spoke with pharmacy, informed to resume at prior rate.  Heparin started, PRN tylenol given for a headache.  Patient ordered dinner.  WCTM

## 2022-10-02 ENCOUNTER — Encounter: Payer: Self-pay | Admitting: Cardiovascular Disease

## 2022-10-02 ENCOUNTER — Encounter
Admission: EM | Disposition: A | Discharge status: Home / Self Care | Payer: Self-pay | Source: Home / Self Care | Attending: Internal Medicine

## 2022-10-02 DIAGNOSIS — E782 Mixed hyperlipidemia: Secondary | ICD-10-CM

## 2022-10-02 HISTORY — PX: LEFT HEART CATH AND CORONARY ANGIOGRAPHY: CATH118249

## 2022-10-02 LAB — HEPARIN LEVEL (UNFRACTIONATED)
Heparin Unfractionated: 0.39 IU/mL (ref 0.30–0.70)
Heparin Unfractionated: 0.51 IU/mL (ref 0.30–0.70)

## 2022-10-02 LAB — CBC
HCT: 32.1 % — ABNORMAL LOW (ref 36.0–46.0)
Hemoglobin: 9.9 g/dL — ABNORMAL LOW (ref 12.0–15.0)
MCH: 25.1 pg — ABNORMAL LOW (ref 26.0–34.0)
MCHC: 30.8 g/dL (ref 30.0–36.0)
MCV: 81.3 fL (ref 80.0–100.0)
Platelets: 246 10*3/uL (ref 150–400)
RBC: 3.95 MIL/uL (ref 3.87–5.11)
RDW: 15 % (ref 11.5–15.5)
WBC: 5.4 10*3/uL (ref 4.0–10.5)
nRBC: 0 % (ref 0.0–0.2)

## 2022-10-02 LAB — BASIC METABOLIC PANEL
Anion gap: 3 — ABNORMAL LOW (ref 5–15)
BUN: 12 mg/dL (ref 6–20)
CO2: 24 mmol/L (ref 22–32)
Calcium: 8.4 mg/dL — ABNORMAL LOW (ref 8.9–10.3)
Chloride: 112 mmol/L — ABNORMAL HIGH (ref 98–111)
Creatinine, Ser: 0.68 mg/dL (ref 0.44–1.00)
GFR, Estimated: >60 mL/min (ref >60)
Glucose, Bld: 107 mg/dL — ABNORMAL HIGH (ref 70–99)
Potassium: 4.1 mmol/L (ref 3.5–5.1)
Sodium: 139 mmol/L (ref 135–145)

## 2022-10-02 LAB — LIPOPROTEIN A (LPA): Lipoprotein (a): 35.3 nmol/L — ABNORMAL HIGH (ref <75.0)

## 2022-10-02 SURGERY — LEFT HEART CATH AND CORONARY ANGIOGRAPHY
Anesthesia: Moderate Sedation

## 2022-10-02 MED ORDER — ATORVASTATIN CALCIUM 40 MG PO TABS: 40.0000 mg | ORAL_TABLET | Freq: Every evening | ORAL | 11 refills | Status: DC

## 2022-10-02 MED ORDER — ISOSORBIDE MONONITRATE ER 30 MG PO TB24: 30.0000 mg | ORAL_TABLET | Freq: Every day | ORAL | Status: DC

## 2022-10-02 MED ORDER — IOHEXOL 300 MG/ML  SOLN
INTRAMUSCULAR | Status: DC | PRN
  Administered 2022-10-02: 90 mL

## 2022-10-02 MED ORDER — ASPIRIN 81 MG PO TBEC: 81.0000 mg | DELAYED_RELEASE_TABLET | Freq: Every day | ORAL | 12 refills | Status: AC

## 2022-10-02 MED ORDER — MIDAZOLAM HCL 2 MG/2ML IJ SOLN
INTRAMUSCULAR | Status: DC | PRN
  Administered 2022-10-02: 1 mg via INTRAVENOUS

## 2022-10-02 MED ORDER — PANTOPRAZOLE SODIUM 40 MG PO TBEC: 40.0000 mg | DELAYED_RELEASE_TABLET | Freq: Every day | ORAL | 11 refills | Status: AC

## 2022-10-02 MED ORDER — ASPIRIN 81 MG PO CHEW
CHEWABLE_TABLET | ORAL | Status: AC
  Filled 2022-10-02: qty 1

## 2022-10-02 MED ORDER — ATORVASTATIN CALCIUM 20 MG PO TABS: 40.0000 mg | ORAL_TABLET | Freq: Every evening | ORAL | Status: DC

## 2022-10-02 MED ORDER — FENTANYL CITRATE (PF) 100 MCG/2ML IJ SOLN
INTRAMUSCULAR | Status: DC | PRN
  Administered 2022-10-02: 25 ug via INTRAVENOUS

## 2022-10-02 MED ORDER — HEPARIN (PORCINE) IN NACL 1000-0.9 UT/500ML-% IV SOLN
INTRAVENOUS | Status: AC
  Filled 2022-10-02: qty 1000

## 2022-10-02 MED ORDER — HEPARIN SODIUM (PORCINE) 1000 UNIT/ML IJ SOLN
INTRAMUSCULAR | Status: DC | PRN
  Administered 2022-10-02: 5000 [IU] via INTRAVENOUS

## 2022-10-02 MED ORDER — SODIUM CHLORIDE 0.9% FLUSH
3.0000 mL | Freq: Two times a day (BID) | INTRAVENOUS | Status: DC
  Administered 2022-10-02: 3 mL via INTRAVENOUS

## 2022-10-02 MED ORDER — VERAPAMIL HCL 2.5 MG/ML IV SOLN
INTRAVENOUS | Status: DC | PRN
  Administered 2022-10-02: 2.5 mg via INTRA_ARTERIAL

## 2022-10-02 MED ORDER — VERAPAMIL HCL 2.5 MG/ML IV SOLN
INTRAVENOUS | Status: AC
  Filled 2022-10-02: qty 2

## 2022-10-02 MED ORDER — HEPARIN SODIUM (PORCINE) 1000 UNIT/ML IJ SOLN
INTRAMUSCULAR | Status: AC
  Filled 2022-10-02: qty 10

## 2022-10-02 MED ORDER — SODIUM CHLORIDE 0.9 % IV SOLN: 250.0000 mL | INTRAVENOUS | Status: DC | PRN

## 2022-10-02 MED ORDER — FENOFIBRATE 160 MG PO TABS: 160.0000 mg | ORAL_TABLET | Freq: Every day | ORAL | 11 refills | Status: DC

## 2022-10-02 MED ORDER — HEPARIN (PORCINE) IN NACL 1000-0.9 UT/500ML-% IV SOLN
INTRAVENOUS | Status: DC | PRN
  Administered 2022-10-02 (×2): 500 mL

## 2022-10-02 MED ORDER — FENTANYL CITRATE (PF) 100 MCG/2ML IJ SOLN
INTRAMUSCULAR | Status: AC
  Filled 2022-10-02: qty 2

## 2022-10-02 MED ORDER — SODIUM CHLORIDE 0.9% FLUSH: 3.0000 mL | INTRAVENOUS | Status: DC | PRN

## 2022-10-02 MED ORDER — CLOPIDOGREL BISULFATE 75 MG PO TABS
75.0000 mg | ORAL_TABLET | Freq: Every day | ORAL | Status: DC
  Administered 2022-10-02: 75 mg via ORAL
  Filled 2022-10-02: qty 1

## 2022-10-02 MED ORDER — ISOSORBIDE MONONITRATE ER 30 MG PO TB24: 30.0000 mg | ORAL_TABLET | Freq: Every day | ORAL | 2 refills | Status: DC

## 2022-10-02 MED ORDER — LIDOCAINE HCL (PF) 1 % IJ SOLN
INTRAMUSCULAR | Status: DC | PRN
  Administered 2022-10-02: 2 mL

## 2022-10-02 MED ORDER — SODIUM CHLORIDE 0.9 % IV SOLN: INTRAVENOUS | Status: AC

## 2022-10-02 MED ORDER — LIDOCAINE HCL 1 % IJ SOLN
INTRAMUSCULAR | Status: AC
  Filled 2022-10-02: qty 20

## 2022-10-02 MED ORDER — CLOPIDOGREL BISULFATE 75 MG PO TABS: 75.0000 mg | ORAL_TABLET | Freq: Every day | ORAL | 11 refills | Status: DC

## 2022-10-02 MED ORDER — ASPIRIN 81 MG PO CHEW
81.0000 mg | CHEWABLE_TABLET | Freq: Once | ORAL | Status: AC
  Administered 2022-10-02: 81 mg via ORAL

## 2022-10-02 MED ORDER — MIDAZOLAM HCL 2 MG/2ML IJ SOLN
INTRAMUSCULAR | Status: AC
  Filled 2022-10-02: qty 2

## 2022-10-02 SURGICAL SUPPLY — 12 items
BAND ZEPHYR COMPRESS 30 LONG (HEMOSTASIS) IMPLANT
CATH INFINITI 5FR ANG PIGTAIL (CATHETERS) IMPLANT
CATH INFINITI 5FR JK (CATHETERS) IMPLANT
DRAPE BRACHIAL (DRAPES) IMPLANT
GLIDESHEATH SLEND SS 6F .021 (SHEATH) IMPLANT
GUIDEWIRE INQWIRE 1.5J.035X260 (WIRE) IMPLANT
INQWIRE 1.5J .035X260CM (WIRE) ×1
KIT SYRINGE INJ CVI SPIKEX1 (MISCELLANEOUS) IMPLANT
PACK CARDIAC CATH (CUSTOM PROCEDURE TRAY) ×1 IMPLANT
PROTECTION STATION PRESSURIZED (MISCELLANEOUS) ×1
SET ATX SIMPLICITY (MISCELLANEOUS) IMPLANT
STATION PROTECTION PRESSURIZED (MISCELLANEOUS) IMPLANT

## 2022-10-02 NOTE — Progress Notes (Signed)
Andrea [name] to be D/C'd Home per MD order.  Discussed prescriptions and follow up appointments with the patient. Prescriptions electronically submitted. medication list explained in detail. Pt verbalized understanding. Radial site care instructions given to patient. Patient verbalized understanding. Dressing to Right radial clean/dry. No bleeding noted  Allergies as of 10/02/2022   No Known Allergies      Medication List     STOP taking these medications    Edarbi 40 MG Tabs Generic drug: Azilsartan Medoxomil   ibuprofen 200 MG tablet Commonly known as: ADVIL   omeprazole 40 MG capsule Commonly known as: PRILOSEC Replaced by: pantoprazole 40 MG tablet       TAKE these medications    acetaminophen 325 MG tablet Commonly known as: TYLENOL Take 2 tablets (650 mg total) by mouth every 6 (six) hours as needed for mild pain (or temp > 100).   aspirin EC 81 MG tablet Take 1 tablet (81 mg total) by mouth daily. Swallow whole.   atorvastatin 40 MG tablet Commonly known as: LIPITOR Take 1 tablet (40 mg total) by mouth every evening.   clopidogrel 75 MG tablet Commonly known as: PLAVIX Take 1 tablet (75 mg total) by mouth daily with breakfast. Start taking on: October 03, 2022   fenofibrate 160 MG tablet Take 1 tablet (160 mg total) by mouth daily. Start taking on: October 03, 2022 What changed:  medication strength how much to take   hydrocortisone 2.5 % rectal cream Commonly known as: ANUSOL-HC   isosorbide mononitrate 30 MG 24 hr tablet Commonly known as: IMDUR Take 1 tablet (30 mg total) by mouth daily.   metoprolol tartrate 25 MG tablet Commonly known as: LOPRESSOR Take 1 tablet (25 mg total) by mouth 2 (two) times daily.   pantoprazole 40 MG tablet Commonly known as: PROTONIX Take 1 tablet (40 mg total) by mouth daily. Replaces: omeprazole 40 MG capsule        Vitals:   10/02/22 1059 10/02/22 1132  BP: 122/69 116/62  Pulse: 86 90  Resp: 18  20  Temp: 98.8 F (37.1 C) 98.6 F (37 C)  SpO2: 97% 97%    Skin clean, dry and intact without evidence of skin break down, no evidence of skin tears noted. IV catheter discontinued intact. Site without signs and symptoms of complications. Dressing and pressure applied. Pt denies pain at this time. No complaints noted.  An After Visit Summary was printed and given to the patient. Patient escorted via [place], and D/C home via private auto.  Andrea [name]

## 2022-10-02 NOTE — Progress Notes (Signed)
ANTICOAGULATION CONSULT NOTE - Initial Consult  Pharmacy Consult for Heparin  Indication: chest pain/ACS  No Known Allergies  Patient Measurements: Height: '5\' 7"'$  (170.2 cm) Weight: 113.2 kg (249 lb 8 oz) IBW/kg (Calculated) : 61.6 Heparin Dosing Weight: 87.9 kg   Vital Signs: Temp: 98.6 F (37 C) (12/08 0305) Temp Source: Oral (12/08 0305) BP: 108/71 (12/08 0305) Pulse Rate: 84 (12/08 0305)  Labs: Recent Labs    09/30/22 0308 09/30/22 0428 09/30/22 1442 10/01/22 0435 10/01/22 0823 10/01/22 1335 10/01/22 1945 10/02/22 0008 10/02/22 0609  HGB 11.4*  --   --  10.4*  --   --   --   --  9.9*  HCT 37.1  --   --  34.1*  --   --   --   --  32.1*  PLT 280  --   --  265  --   --   --   --  246  APTT  --  28  --   --   --   --   --   --   --   LABPROT  --  13.2  --   --   --   --   --   --   --   INR  --  1.0  --   --   --   --   --   --   --   HEPARINUNFRC  --   --    < >  --    < > 0.50  --  0.39 0.51  CREATININE 0.97  --   --  0.92  --   --   --   --   --   TROPONINIHS 396* 347*  --   --   --   --  352*  --   --    < > = values in this interval not displayed.     Estimated Creatinine Clearance: 91.8 mL/min (by C-G formula based on SCr of 0.92 mg/dL).   Medical History: Past Medical History:  Diagnosis Date   Abnormal Pap smear of cervix    2010-2014 abnormal pap every 3-6 months   dysplastic squamous cells of cervix    Anxiety    occ.- no meds   Blood in stool    Depression    no meds   Diverticulitis 08/2019   DUB (dysfunctional uterine bleeding)    Dyslipidemia    Dyspnea    Resolved - due to anemia - no current problems   Eczema    Endometriosis    Epigastric pain    Fatigue    Fibroid    GERD (gastroesophageal reflux disease)    Grade I diastolic dysfunction 44/81/8563   Noted on ECHO   Hemorrhoid    2nd and 3rd degree   History of adenomatous polyp of colon    History of palpitations    HSV infection    Hx of dysplastic nevus 07/17/2019   L  upper back 4.0cm lateral to spine, moderate   Hyperlipidemia    diet controlled - no meds   Hypertension    not currently taking medication   Infertility, female    Iron deficiency anemia    Last iron infusion 09/14/2018   Left groin hernia    history of   Muscle cramping    with exertion   Obesity    Palpitations    Recurrent bacterial infection    Vaginal   Restless leg  due to anemia   Smoker    SVD (spontaneous vaginal delivery)    x 2   Thoracic back pain    while trying to sleep   Tinnitus, left    hears heart beat, MRI normal   Vertigo 2018    Medications:  Medications Prior to Admission  Medication Sig Dispense Refill Last Dose   EDARBI 40 MG TABS Take 0.5 tablets by mouth daily.   09/29/2022 at 0800   metoprolol tartrate (LOPRESSOR) 25 MG tablet Take 1 tablet (25 mg total) by mouth 2 (two) times daily. 180 tablet 3 09/29/2022 at 2200   omeprazole (PRILOSEC) 40 MG capsule Take 40 mg by mouth every morning.   09/29/2022 at 0800   acetaminophen (TYLENOL) 325 MG tablet Take 2 tablets (650 mg total) by mouth every 6 (six) hours as needed for mild pain (or temp > 100).   prn at prn   fenofibrate (TRICOR) 145 MG tablet Take 1 tablet (145 mg total) by mouth daily. (Patient not taking: Reported on 09/30/2022) 90 tablet 3 Not Taking   hydrocortisone (ANUSOL-HC) 2.5 % rectal cream    prn at prn   ibuprofen (ADVIL) 200 MG tablet Take 600-800 mg by mouth every 6 (six) hours as needed for moderate pain.   prn at prn  Heparin Dosing Weight: 87.9 kg   Assessment: Pharmacy consulted to dose heparin in this 51 year old female w/ h/o hypertension, dyslipidemia, fatigue, gastroesophageal reflux disease, history of palpitations, iron deficiency anemia, former smoker (quit 05/2022), family history of early onset CAD, who is being undergoing evaluation of chest pain & admitted with ACS/NSTEMI.  No prior anticoag noted. CrCl = 87.1 ml/min  Date Time   HL Rate/Comment 12/06 1442 0.14 Subtherapeutic; 1200 > 1500 un/hr  12/06   2154    0.28    SUBtherapeutic @ 1500>1650 un/hr  12/07 0823 0.53 Therapeutic x1; 1650 un/hr 12/07 1335 0.5 Therapeutic x2; 1650 un/hr (collected early prior to transfer OTF to cath lab, if continued would repeat level x1 to confirm) 12/08   0008    0.39    Therapeutic X 3  12/08   0609    0.51    Therapeutic X 4  Goal of Therapy:  Heparin level 0.3-0.7 units/ml Monitor platelets by anticoagulation protocol: Yes   Plan:  12/09:  HL @ 0609 = 0.51, therapeutic X 4 Will continue pt on current rate and recheck HL on 12/09 @ 0500. Check CBC daily    Willer Osorno D 10/02/2022,6:44 AM

## 2022-10-02 NOTE — Progress Notes (Signed)
ANTICOAGULATION CONSULT NOTE - Initial Consult  Pharmacy Consult for Heparin  Indication: chest pain/ACS  No Known Allergies  Patient Measurements: Height: '5\' 7"'$  (170.2 cm) Weight: 113.4 kg (250 lb) IBW/kg (Calculated) : 61.6 Heparin Dosing Weight: 87.9 kg   Vital Signs: Temp: 98.2 F (36.8 C) (12/07 2244) Temp Source: Oral (12/07 1754) BP: 118/66 (12/07 2244) Pulse Rate: 86 (12/07 2244)  Labs: Recent Labs    09/30/22 0308 09/30/22 0428 09/30/22 1442 10/01/22 0435 10/01/22 0823 10/01/22 1335 10/01/22 1945 10/02/22 0008  HGB 11.4*  --   --  10.4*  --   --   --   --   HCT 37.1  --   --  34.1*  --   --   --   --   PLT 280  --   --  265  --   --   --   --   APTT  --  28  --   --   --   --   --   --   LABPROT  --  13.2  --   --   --   --   --   --   INR  --  1.0  --   --   --   --   --   --   HEPARINUNFRC  --   --    < >  --  0.53 0.50  --  0.39  CREATININE 0.97  --   --  0.92  --   --   --   --   TROPONINIHS 396* 347*  --   --   --   --  352*  --    < > = values in this interval not displayed.     Estimated Creatinine Clearance: 91.9 mL/min (by C-G formula based on SCr of 0.92 mg/dL).   Medical History: Past Medical History:  Diagnosis Date   Abnormal Pap smear of cervix    2010-2014 abnormal pap every 3-6 months   dysplastic squamous cells of cervix    Anxiety    occ.- no meds   Blood in stool    Depression    no meds   Diverticulitis 08/2019   DUB (dysfunctional uterine bleeding)    Dyslipidemia    Dyspnea    Resolved - due to anemia - no current problems   Eczema    Endometriosis    Epigastric pain    Fatigue    Fibroid    GERD (gastroesophageal reflux disease)    Grade I diastolic dysfunction 92/33/0076   Noted on ECHO   Hemorrhoid    2nd and 3rd degree   History of adenomatous polyp of colon    History of palpitations    HSV infection    Hx of dysplastic nevus 07/17/2019   L upper back 4.0cm lateral to spine, moderate   Hyperlipidemia     diet controlled - no meds   Hypertension    not currently taking medication   Infertility, female    Iron deficiency anemia    Last iron infusion 09/14/2018   Left groin hernia    history of   Muscle cramping    with exertion   Obesity    Palpitations    Recurrent bacterial infection    Vaginal   Restless leg    due to anemia   Smoker    SVD (spontaneous vaginal delivery)    x 2   Thoracic back pain  while trying to sleep   Tinnitus, left    hears heart beat, MRI normal   Vertigo 2018    Medications:  Medications Prior to Admission  Medication Sig Dispense Refill Last Dose   EDARBI 40 MG TABS Take 0.5 tablets by mouth daily.   09/29/2022 at 0800   metoprolol tartrate (LOPRESSOR) 25 MG tablet Take 1 tablet (25 mg total) by mouth 2 (two) times daily. 180 tablet 3 09/29/2022 at 2200   omeprazole (PRILOSEC) 40 MG capsule Take 40 mg by mouth every morning.   09/29/2022 at 0800   acetaminophen (TYLENOL) 325 MG tablet Take 2 tablets (650 mg total) by mouth every 6 (six) hours as needed for mild pain (or temp > 100).   prn at prn   fenofibrate (TRICOR) 145 MG tablet Take 1 tablet (145 mg total) by mouth daily. (Patient not taking: Reported on 09/30/2022) 90 tablet 3 Not Taking   hydrocortisone (ANUSOL-HC) 2.5 % rectal cream    prn at prn   ibuprofen (ADVIL) 200 MG tablet Take 600-800 mg by mouth every 6 (six) hours as needed for moderate pain.   prn at prn  Heparin Dosing Weight: 87.9 kg   Assessment: Pharmacy consulted to dose heparin in this 51 year old female w/ h/o hypertension, dyslipidemia, fatigue, gastroesophageal reflux disease, history of palpitations, iron deficiency anemia, former smoker (quit 05/2022), family history of early onset CAD, who is being undergoing evaluation of chest pain & admitted with ACS/NSTEMI.  No prior anticoag noted. CrCl = 87.1 ml/min  Date Time  HL Rate/Comment 12/06 1442 0.14 Subtherapeutic; 1200 > 1500 un/hr  12/06   2154    0.28     SUBtherapeutic @ 1500>1650 un/hr  12/07 0823 0.53 Therapeutic x1; 1650 un/hr 12/07 1335 0.5 Therapeutic x2; 1650 un/hr (collected early prior to transfer OTF to cath lab, if continued would repeat level x1 to confirm) 12/08   0008    0.39    Therapeutic X 3   Goal of Therapy:  Heparin level 0.3-0.7 units/ml Monitor platelets by anticoagulation protocol: Yes   Plan:  12/08:  HL @ 0008 = 0.39, therapeutic X 3 Will continue pt on current rate and recheck HL on 12/08 @ 0600. Check CBC daily    Andrea [name] D 10/02/2022,12:53 AM

## 2022-10-02 NOTE — Discharge Summary (Signed)
Physician Discharge Summary   Patient: Andrea [name] MRN: [id number] DOB: [birth year]  Admit date:     09/30/2022  Discharge date: 10/02/22  Discharge Physician: Annita Brod   PCP: Jilda Panda, MD   Recommendations at discharge:   Patient referred for cardiac rehab Patient will follow-up with cardiology in the next 2 weeks. New medication: Aspirin 81 mg p.o. daily.  Patient supposed to been on this medication, but had not been taking New medication: Lipitor 40 mg p.o. daily Medication change: Fenofibrate 145 p.o. increased to 160 daily New medication: Plavix 75 mg p.o. daily Medication change: Prilosec changed to Protonix 40 mg p.o. daily due to interaction with Plavix Patient advised to stop taking other as needed NSAIDs since she is going to be on aspirin and Plavix Medication change: Edarbi discontinued New medication: Imdur 30 mg p.o. daily  Discharge Diagnoses: Principal Problem:   NSTEMI (non-ST elevated myocardial infarction) ([place]) Active Problems:   Essential hypertension   Dyslipidemia   Subjective fever   GERD without esophagitis   Angina pectoris ([place])  Resolved Problems:   * No resolved hospital problems. *  Hospital Course: 51 year old female with past medical history of hyperlipidemia and hypertension and morbid obesity who presented to the emergency room on the early morning hours of 12/6 with several days of chest discomfort and shortness of breath with mild ambulation which admittedly had been going on for several weeks but increasing in frequency. EKG was unremarkable but troponins were mildly elevated in the 300s. Patient not hypoxic and COVID ruled out. She did note a low-grade temperature prior to coming in. Patient seen by cardiology and taken for stress test this afternoon noting possible anterior defect.  Patient underwent cardiac catheterization on 12/8 which was unremarkable noting clean vessels.  Felt to likely be MINOCA.  Felt to be stable  for discharge with details as below.  Assessment and Plan: * NSTEMI (non-ST elevated myocardial infarction) ([place]), specifically [place] (myocardial infarction without obstructive coronary artery disease) Follow-up with cardiology in 2 weeks.  Referral for cardiac rehab.  Discharged on aspirin and Plavix.  Lipitor started.  Increase fenofibrate.  Started on Imdur.  Should chest pain recur, consider CMD testing.   Chronic diastolic heart failure Grade 1 diastolic dysfunction noted on echocardiogram.  Currently appears euvolemic   Essential hypertension -Antihypertensive change as above.   Dyslipidemia -Tricor increased.  Started on low-dose statin as well.   Subjective fever - Her COVID-19 PCR, and influenza antigens are currently pending.  Her UA came back negative.  Her chest x-ray is unremarkable.  We will continue to monitor her temperature here.  May be reactive secondary to non-STEMI   GERD without esophagitis - We will continue PPI therapy, although changing home Prilosec to Protonix given interaction with Plavix.   Morbid obesity Meets criteria with BMI greater than 35 and comorbidity of hypertension           Consultants: Cardiology Procedures performed:  Nuclear stress test 12/6 Echocardiogram 12/6 noting preserved ejection fraction and grade 1 diastolic dysfunction Cardiac catheterization 12/7  Disposition: Home Diet recommendation:  Discharge Diet Orders (From admission, onward)     Start     Ordered   10/02/22 0000  Diet - low sodium heart healthy        10/02/22 1358           Cardiac diet DISCHARGE MEDICATION: Allergies as of 10/02/2022   No Known Allergies      Medication List  STOP taking these medications    Edarbi 40 MG Tabs Generic drug: Azilsartan Medoxomil   ibuprofen 200 MG tablet Commonly known as: ADVIL   omeprazole 40 MG capsule Commonly known as: PRILOSEC Replaced by: pantoprazole 40 MG tablet       TAKE these  medications    acetaminophen 325 MG tablet Commonly known as: TYLENOL Take 2 tablets (650 mg total) by mouth every 6 (six) hours as needed for mild pain (or temp > 100).   aspirin EC 81 MG tablet Take 1 tablet (81 mg total) by mouth daily. Swallow whole.   atorvastatin 40 MG tablet Commonly known as: LIPITOR Take 1 tablet (40 mg total) by mouth every evening.   clopidogrel 75 MG tablet Commonly known as: PLAVIX Take 1 tablet (75 mg total) by mouth daily with breakfast. Start taking on: October 03, 2022   fenofibrate 160 MG tablet Take 1 tablet (160 mg total) by mouth daily. Start taking on: October 03, 2022 What changed:  medication strength how much to take   hydrocortisone 2.5 % rectal cream Commonly known as: ANUSOL-HC   isosorbide mononitrate 30 MG 24 hr tablet Commonly known as: IMDUR Take 1 tablet (30 mg total) by mouth daily.   metoprolol tartrate 25 MG tablet Commonly known as: LOPRESSOR Take 1 tablet (25 mg total) by mouth 2 (two) times daily.   pantoprazole 40 MG tablet Commonly known as: PROTONIX Take 1 tablet (40 mg total) by mouth daily. Replaces: omeprazole 40 MG capsule        Follow-up Information     Wellington Hampshire, MD Follow up.   Specialty: Cardiology Why: Office will call you to make appointment to be seen in the next few weeks Contact information: [place] [address] [phone]                Discharge Exam: Danley Danker Weights   09/30/22 0256 10/01/22 1514 10/02/22 0554  Weight: 113.4 kg 113.4 kg 113.2 kg   General: Alert and oriented x 3, no acute distress Cardiovascular: Regular rate and rhythm, S1-S2 Lungs: Clear to auscultation bilaterally  Condition at discharge: good  The results of significant diagnostics from this hospitalization (including imaging, microbiology, ancillary and laboratory) are listed below for reference.   Imaging Studies: CARDIAC CATHETERIZATION  Result Date:  10/02/2022   The left ventricular systolic function is normal.   LV end diastolic pressure is mildly elevated.   The left ventricular ejection fraction is 55-65% by visual estimate. 1.  Normal coronary arteries. 2.  Normal LV systolic function mildly elevated left ventricular end-diastolic pressure. Recommendations: The patient has [place] (myocardial infarction without obstructive coronary artery disease). Recommend medical therapy.  I added clopidogrel to be used for 1 year.  I added Imdur 30 mg once daily. The patient can likely be discharged home later today. CMD testing can be considered as an outpatient if the patient continues to have symptoms.   ECHOCARDIOGRAM COMPLETE  Result Date: 09/30/2022    ECHOCARDIOGRAM REPORT   Patient Name:   Andrea [name] Date of Exam: 09/30/2022 Medical Rec #:  [id number]            Height:       67.0 in Accession #:    [id number]           Weight:       250.0 lb Date of Birth:  01/03/1971             BSA:  2.223 m Patient Age:    51 years             BP:           125/69 mmHg Patient Gender: F                    HR:           77 bpm. Exam Location:  [facility] Procedure: 2D Echo, Cardiac Doppler and Color Doppler Indications:     NSTEMI I21.4  History:         Patient has prior history of Echocardiogram examinations, most                  recent 06/25/2017. Signs/Symptoms:Dyspnea; Risk                  Factors:Hypertension and Dyslipidemia.  Sonographer:     Sherrie Sport Referring Phys:  [id number] JAN A MANSY Diagnosing Phys: Kate Sable MD  Sonographer Comments: Image quality was fair. IMPRESSIONS  1. Left ventricular ejection fraction, by estimation, is 60 to 65%. The left ventricle has normal function. The left ventricle has no regional wall motion abnormalities. There is moderate left ventricular hypertrophy. Left ventricular diastolic parameters are consistent with Grade I diastolic dysfunction (impaired relaxation).  2. Right ventricular systolic function is  normal. The right ventricular size is not well visualized.  3. The mitral valve is grossly normal. No evidence of mitral valve regurgitation.  4. The aortic valve was not well visualized. Aortic valve regurgitation is not visualized. FINDINGS  Left Ventricle: Left ventricular ejection fraction, by estimation, is 60 to 65%. The left ventricle has normal function. The left ventricle has no regional wall motion abnormalities. The left ventricular internal cavity size was normal in size. There is  moderate left ventricular hypertrophy. Left ventricular diastolic parameters are consistent with Grade I diastolic dysfunction (impaired relaxation). Right Ventricle: The right ventricular size is not well visualized. No increase in right ventricular wall thickness. Right ventricular systolic function is normal. Left Atrium: Left atrial size was normal in size. Right Atrium: Right atrial size was not well visualized. Pericardium: There is no evidence of pericardial effusion. Mitral Valve: The mitral valve is grossly normal. No evidence of mitral valve regurgitation. Tricuspid Valve: The tricuspid valve is not well visualized. Tricuspid valve regurgitation is not demonstrated. Aortic Valve: The aortic valve was not well visualized. Aortic valve regurgitation is not visualized. Aortic valve mean gradient measures 4.0 mmHg. Aortic valve peak gradient measures 8.0 mmHg. Aortic valve area, by VTI measures 2.65 cm. Pulmonic Valve: The pulmonic valve was not well visualized. Pulmonic valve regurgitation is not visualized. Aorta: The aortic root is normal in size and structure. Venous: The inferior vena cava was not well visualized. IAS/Shunts: No atrial level shunt detected by color flow Doppler.  LEFT VENTRICLE PLAX 2D LVIDd:         3.70 cm   Diastology LVIDs:         2.50 cm   LV e' medial:   9.14 cm/s LV PW:         1.60 cm   LV E/e' medial: 8.7 LV IVS:        1.60 cm LVOT diam:     2.00 cm LV SV:         57 LV SV Index:   25  LVOT Area:     3.14 cm  RIGHT VENTRICLE RV Basal diam:  2.80 cm RV Mid diam:  2.80 cm RV S prime:     15.00 cm/s TAPSE (M-mode): 1.3 cm LEFT ATRIUM             Index        RIGHT ATRIUM           Index LA diam:        3.20 cm 1.44 cm/m   RA Area:     12.90 cm LA Vol (A2C):   52.0 ml 23.39 ml/m  RA Volume:   30.50 ml  13.72 ml/m LA Vol (A4C):   36.8 ml 16.55 ml/m LA Biplane Vol: 44.6 ml 20.06 ml/m  AORTIC VALVE AV Area (Vmax):    2.81 cm AV Area (Vmean):   3.00 cm AV Area (VTI):     2.65 cm AV Vmax:           141.00 cm/s AV Vmean:          93.000 cm/s AV VTI:            0.213 m AV Peak Grad:      8.0 mmHg AV Mean Grad:      4.0 mmHg LVOT Vmax:         126.00 cm/s LVOT Vmean:        88.800 cm/s LVOT VTI:          0.180 m LVOT/AV VTI ratio: 0.85  AORTA Ao Root diam: 3.60 cm MITRAL VALVE                TRICUSPID VALVE MV Area (PHT): 2.97 cm     TR Peak grad:   18.0 mmHg MV Decel Time: 255 msec     TR Vmax:        212.00 cm/s MV E velocity: 79.70 cm/s MV A velocity: 101.00 cm/s  SHUNTS MV E/A ratio:  0.79         Systemic VTI:  0.18 m                             Systemic Diam: 2.00 cm Kate Sable MD Electronically signed by Kate Sable MD Signature Date/Time: 09/30/2022/7:32:51 PM    Final    NM Myocar Multi W/Spect Tamela Oddi Motion / EF  Result Date: 09/30/2022   No ST deviation was noted.   LV perfusion is abnormal. Defect 1: There is a medium defect with moderate reduction in uptake present in the mid to basal anterior location(s) that is reversible. There is normal wall motion in the defect area. Consistent with ischemia.   Left ventricular function is normal visually, calculated EF is 46%. correlation with echo advised.   No significant coronary artery calcifications noted.   DG Chest 1 View  Result Date: 09/30/2022 CLINICAL DATA:  Shortness of breath EXAM: CHEST  1 VIEW COMPARISON:  06/08/2018 FINDINGS: Heart and mediastinal contours are within normal limits. No focal opacities or  effusions. No acute bony abnormality. Aortic calcifications. IMPRESSION: No active cardiopulmonary disease. Electronically Signed   By: Rolm Baptise M.D.   On: 09/30/2022 03:24    Microbiology: Results for orders placed or performed during the hospital encounter of 09/30/22  Resp Panel by RT-PCR (Flu A&B, Covid) Anterior Nasal Swab     Status: None   Collection Time: 09/30/22  4:28 AM   Specimen: Anterior Nasal Swab  Result Value Ref Range Status   SARS Coronavirus 2 by RT PCR NEGATIVE NEGATIVE Final    Comment: (NOTE) SARS-CoV-2 target  nucleic acids are NOT DETECTED.  The SARS-CoV-2 RNA is generally detectable in upper respiratory specimens during the acute phase of infection. The lowest concentration of SARS-CoV-2 viral copies this assay can detect is 138 copies/mL. A negative result does not preclude SARS-Cov-2 infection and should not be used as the sole basis for treatment or other patient management decisions. A negative result may occur with  improper specimen collection/handling, submission of specimen other than nasopharyngeal swab, presence of viral mutation(s) within the areas targeted by this assay, and inadequate number of viral copies(<138 copies/mL). A negative result must be combined with clinical observations, patient history, and epidemiological information. The expected result is Negative.  Fact Sheet for Patients:  EntrepreneurPulse.com.au  Fact Sheet for Healthcare Providers:  IncredibleEmployment.be  This test is no t yet approved or cleared by the [place] FDA and  has been authorized for detection and/or diagnosis of SARS-CoV-2 by FDA under an Emergency Use Authorization (EUA). This EUA will remain  in effect (meaning this test can be used) for the duration of the COVID-19 declaration under Section 564(b)(1) of the Act, 21 U.S.C.section 360bbb-3(b)(1), unless the authorization is terminated  or revoked sooner.        Influenza A by PCR NEGATIVE NEGATIVE Final   Influenza B by PCR NEGATIVE NEGATIVE Final    Comment: (NOTE) The Xpert Xpress SARS-CoV-2/FLU/RSV plus assay is intended as an aid in the diagnosis of influenza from Nasopharyngeal swab specimens and should not be used as a sole basis for treatment. Nasal washings and aspirates are unacceptable for Xpert Xpress SARS-CoV-2/FLU/RSV testing.  Fact Sheet for Patients: EntrepreneurPulse.com.au  Fact Sheet for Healthcare Providers: IncredibleEmployment.be  This test is not yet approved or cleared by the [place] FDA and has been authorized for detection and/or diagnosis of SARS-CoV-2 by FDA under an Emergency Use Authorization (EUA). This EUA will remain in effect (meaning this test can be used) for the duration of the COVID-19 declaration under Section 564(b)(1) of the Act, 21 U.S.C. section 360bbb-3(b)(1), unless the authorization is terminated or revoked.  Performed at [facility], [address] [place], [place] [address]     Labs: CBC: Recent Labs  Lab 09/30/22 0308 10/01/22 0435 10/02/22 0609  WBC 9.6 6.7 5.4  NEUTROABS 6.9  --   --   HGB 11.4* 10.4* 9.9*  HCT 37.1 34.1* 32.1*  MCV 80.8 81.8 81.3  PLT 280 265 301   Basic Metabolic Panel: Recent Labs  Lab 09/30/22 0308 10/01/22 0435 10/02/22 0609  NA 137 140 139  K 4.2 4.1 4.1  CL 106 110 112*  CO2 '26 24 24  '$ GLUCOSE 112* 111* 107*  BUN '16 16 12  '$ CREATININE 0.97 0.92 0.68  CALCIUM 9.1 8.5* 8.4*   Liver Function Tests: Recent Labs  Lab 09/30/22 0308  AST 20  ALT 16  ALKPHOS 81  BILITOT 0.7  PROT 7.3  ALBUMIN 4.0   CBG: No results for input(s): "GLUCAP" in the last 168 hours.  Discharge time spent: greater than 30 minutes.  Signed: Annita Brod, MD [facility] 10/02/2022

## 2022-10-06 ENCOUNTER — Encounter: Payer: 59 | Attending: Cardiovascular Disease | Admitting: *Deleted

## 2022-10-06 DIAGNOSIS — H8190 Unspecified disorder of vestibular function, unspecified ear: Secondary | ICD-10-CM | POA: Insufficient documentation

## 2022-10-06 DIAGNOSIS — I214 Non-ST elevation (NSTEMI) myocardial infarction: Secondary | ICD-10-CM | POA: Insufficient documentation

## 2022-10-06 NOTE — Progress Notes (Signed)
Initial phone call completed. Diagnosis can be found in [facility] 12/6. EP Orientation scheduled for Wednesday 12/13 at 2:30.

## 2022-10-07 VITALS — Ht 68.3 in | Wt 250.9 lb

## 2022-10-07 DIAGNOSIS — I214 Non-ST elevation (NSTEMI) myocardial infarction: Secondary | ICD-10-CM

## 2022-10-07 NOTE — Patient Instructions (Signed)
Patient Instructions  Patient Details  Name: Andrea [name] MRN: [id number] Date of Birth: 18-Sep-1971 Referring Provider:  Wellington Hampshire, MD  Below are your personal goals for exercise, nutrition, and risk factors. Our goal is to help you stay on track towards obtaining and maintaining these goals. We will be discussing your progress on these goals with you throughout the program.  Initial Exercise Prescription:  Initial Exercise Prescription - 10/07/22 1700       Date of Initial Exercise RX and Referring Provider   Date 10/07/22    Referring Provider Kathlyn Sacramento MD      Treadmill   MPH 1.8    Grade 0    Minutes 15    METs 2.38      Recumbant Bike   Level 1    RPM 60    Watts 30    Minutes 15    METs 3.3      NuStep   Level 1    SPM 80    Minutes 15    METs 3.3      REL-XR   Level 1    Speed 50    Minutes 15    METs 3.3      T5 Nustep   Level 1    SPM 80    Minutes 15    METs 3.3      Prescription Details   Frequency (times per week) 3    Duration Progress to 30 minutes of continuous aerobic without signs/symptoms of physical distress      Intensity   THRR 40-80% of Max Heartrate 105 - 146    Ratings of Perceived Exertion 11-13    Perceived Dyspnea 0-4      Progression   Progression Continue to progress workloads to maintain intensity without signs/symptoms of physical distress.      Resistance Training   Training Prescription Yes    Weight 5 lb    Reps 10-15             Exercise Goals: Frequency: Be able to perform aerobic exercise two to three times per week in program working toward 2-5 days per week of home exercise.  Intensity: Work with a perceived exertion of 11 (fairly light) - 15 (hard) while following your exercise prescription.  We will make changes to your prescription with you as you progress through the program.   Duration: Be able to do 30 to 45 minutes of continuous aerobic exercise in addition to a 5 minute  warm-up and a 5 minute cool-down routine.   Nutrition Goals: Your personal nutrition goals will be established when you do your nutrition analysis with the dietician.  The following are general nutrition guidelines to follow: Cholesterol < '200mg'$ /day Sodium < '1500mg'$ /day Fiber: Women over 50 yrs - 21 grams per day  Personal Goals:  Personal Goals and Risk Factors at Admission - 10/07/22 1717       Core Components/Risk Factors/Patient Goals on Admission    Weight Management Yes;Weight Loss;Obesity    Intervention Weight Management: Develop a combined nutrition and exercise program designed to reach desired caloric intake, while maintaining appropriate intake of nutrient and fiber, sodium and fats, and appropriate energy expenditure required for the weight goal.;Weight Management: Provide education and appropriate resources to help participant work on and attain dietary goals.;Weight Management/Obesity: Establish reasonable short term and long term weight goals.;Obesity: Provide education and appropriate resources to help participant work on and attain dietary goals.    Admit Weight  250 lb (113.4 kg)    Goal Weight: Short Term 245 lb (111.1 kg)    Goal Weight: Long Term 180 lb (81.6 kg)   Per patient   Expected Outcomes Short Term: Continue to assess and modify interventions until short term weight is achieved;Long Term: Adherence to nutrition and physical activity/exercise program aimed toward attainment of established weight goal;Weight Loss: Understanding of general recommendations for a balanced deficit meal plan, which promotes 1-2 lb weight loss per week and includes a negative energy balance of [phone] kcal/d;Understanding recommendations for meals to include 15-35% energy as protein, 25-35% energy from fat, 35-60% energy from carbohydrates, less than '200mg'$  of dietary cholesterol, 20-35 gm of total fiber daily;Understanding of distribution of calorie intake throughout the day with the  consumption of 4-5 meals/snacks    Hypertension Yes    Intervention Monitor prescription use compliance.;Provide education on lifestyle modifcations including regular physical activity/exercise, weight management, moderate sodium restriction and increased consumption of fresh fruit, vegetables, and low fat dairy, alcohol moderation, and smoking cessation.    Expected Outcomes Short Term: Continued assessment and intervention until BP is < 140/36m HG in hypertensive participants. < 130/824mHG in hypertensive participants with diabetes, heart failure or chronic kidney disease.;Long Term: Maintenance of blood pressure at goal levels.    Lipids Yes    Intervention Provide education and support for participant on nutrition & aerobic/resistive exercise along with prescribed medications to achieve LDL '70mg'$ , HDL >'40mg'$ .    Expected Outcomes Short Term: Participant states understanding of desired cholesterol values and is compliant with medications prescribed. Participant is following exercise prescription and nutrition guidelines.;Long Term: Cholesterol controlled with medications as prescribed, with individualized exercise RX and with personalized nutrition plan. Value goals: LDL < '70mg'$ , HDL > 40 mg.             Tobacco Use Initial Evaluation: Social History   Tobacco Use  Smoking Status Former   Packs/day: 0.75   Years: 35.00   Total pack years: 26.25   Types: Cigarettes   Quit date: 06/25/2022   Years since quitting: 0.2  Smokeless Tobacco Never    Exercise Goals and Review:  Exercise Goals     Row Name 10/07/22 1717             Exercise Goals   Increase Physical Activity Yes       Intervention Develop an individualized exercise prescription for aerobic and resistive training based on initial evaluation findings, risk stratification, comorbidities and participant's personal goals.;Provide advice, education, support and counseling about physical activity/exercise needs.        Expected Outcomes Short Term: Attend rehab on a regular basis to increase amount of physical activity.;Long Term: Add in home exercise to make exercise part of routine and to increase amount of physical activity.;Long Term: Exercising regularly at least 3-5 days a week.       Increase Strength and Stamina Yes       Intervention Provide advice, education, support and counseling about physical activity/exercise needs.;Develop an individualized exercise prescription for aerobic and resistive training based on initial evaluation findings, risk stratification, comorbidities and participant's personal goals.       Expected Outcomes Short Term: Increase workloads from initial exercise prescription for resistance, speed, and METs.;Short Term: Perform resistance training exercises routinely during rehab and add in resistance training at home;Long Term: Improve cardiorespiratory fitness, muscular endurance and strength as measured by increased METs and functional capacity (6MWT)       Able to understand and use  rate of perceived exertion (RPE) scale Yes       Intervention Provide education and explanation on how to use RPE scale       Expected Outcomes Short Term: Able to use RPE daily in rehab to express subjective intensity level;Long Term:  Able to use RPE to guide intensity level when exercising independently       Able to understand and use Dyspnea scale Yes       Intervention Provide education and explanation on how to use Dyspnea scale       Expected Outcomes Short Term: Able to use Dyspnea scale daily in rehab to express subjective sense of shortness of breath during exertion;Long Term: Able to use Dyspnea scale to guide intensity level when exercising independently       Knowledge and understanding of Target Heart Rate Range (THRR) Yes       Intervention Provide education and explanation of THRR including how the numbers were predicted and where they are located for reference       Expected Outcomes Short  Term: Able to state/look up THRR;Long Term: Able to use THRR to govern intensity when exercising independently;Short Term: Able to use daily as guideline for intensity in rehab       Able to check pulse independently Yes       Intervention Provide education and demonstration on how to check pulse in carotid and radial arteries.;Review the importance of being able to check your own pulse for safety during independent exercise       Expected Outcomes Long Term: Able to check pulse independently and accurately;Short Term: Able to explain why pulse checking is important during independent exercise       Understanding of Exercise Prescription Yes       Intervention Provide education, explanation, and written materials on patient's individual exercise prescription       Expected Outcomes Short Term: Able to explain program exercise prescription;Long Term: Able to explain home exercise prescription to exercise independently                Copy of goals given to participant.

## 2022-10-07 NOTE — Progress Notes (Signed)
Cardiac Individual Treatment Plan  Patient Details  Name: Andrea [name] MRN: [id number] Date of Birth: [birth year] Referring Provider:   Flowsheet Row Cardiac Rehab from 10/07/2022 in [facility] Cardiac and Pulmonary Rehab  Referring Provider Kathlyn Sacramento MD       Initial Encounter Date:  Flowsheet Row Cardiac Rehab from 10/07/2022 in [facility] Cardiac and Pulmonary Rehab  Date 10/07/22       Visit Diagnosis: NSTEMI (non-ST elevated myocardial infarction) [facility])  Patient's Home Medications on Admission:  Current Outpatient Medications:    acetaminophen (TYLENOL) 325 MG tablet, Take 2 tablets (650 mg total) by mouth every 6 (six) hours as needed for mild pain (or temp > 100)., Disp:  , Rfl:    aspirin EC 81 MG tablet, Take 1 tablet (81 mg total) by mouth daily. Swallow whole., Disp: 30 tablet, Rfl: 12   atorvastatin (LIPITOR) 40 MG tablet, Take 1 tablet (40 mg total) by mouth every evening., Disp: 30 tablet, Rfl: 11   clopidogrel (PLAVIX) 75 MG tablet, Take 1 tablet (75 mg total) by mouth daily with breakfast., Disp: 30 tablet, Rfl: 11   fenofibrate 160 MG tablet, Take 1 tablet (160 mg total) by mouth daily., Disp: 60 tablet, Rfl: 11   hydrocortisone (ANUSOL-HC) 2.5 % rectal cream, , Disp: , Rfl:    isosorbide mononitrate (IMDUR) 30 MG 24 hr tablet, Take 1 tablet (30 mg total) by mouth daily., Disp: 30 tablet, Rfl: 2   metoprolol tartrate (LOPRESSOR) 25 MG tablet, Take 1 tablet (25 mg total) by mouth 2 (two) times daily., Disp: 180 tablet, Rfl: 3   pantoprazole (PROTONIX) 40 MG tablet, Take 1 tablet (40 mg total) by mouth daily., Disp: 30 tablet, Rfl: 11  Past Medical History: Past Medical History:  Diagnosis Date   Abnormal Pap smear of cervix    2010-2014 abnormal pap every 3-6 months   dysplastic squamous cells of cervix    Anxiety    occ.- no meds   Blood in stool    Depression    no meds   Diverticulitis 08/2019   DUB (dysfunctional uterine bleeding)    Dyslipidemia     Dyspnea    Resolved - due to anemia - no current problems   Eczema    Endometriosis    Epigastric pain    Fatigue    Fibroid    GERD (gastroesophageal reflux disease)    Grade I diastolic dysfunction 90/24/0973   Noted on ECHO   Hemorrhoid    2nd and 3rd degree   History of adenomatous polyp of colon    History of palpitations    HSV infection    Hx of dysplastic nevus 07/17/2019   L upper back 4.0cm lateral to spine, moderate   Hyperlipidemia    diet controlled - no meds   Hypertension    not currently taking medication   Infertility, female    Iron deficiency anemia    Last iron infusion 09/14/2018   Left groin hernia    history of   Muscle cramping    with exertion   Obesity    Palpitations    Recurrent bacterial infection    Vaginal   Restless leg    due to anemia   Smoker    SVD (spontaneous vaginal delivery)    x 2   Thoracic back pain    while trying to sleep   Tinnitus, left    hears heart beat, MRI normal   Vertigo 2018    Tobacco  Use: Social History   Tobacco Use  Smoking Status Former   Packs/day: 0.75   Years: 35.00   Total pack years: 26.25   Types: Cigarettes   Quit date: 06/25/2022   Years since quitting: 0.2  Smokeless Tobacco Never    Labs: Review Flowsheet       Latest Ref Rng & Units 05/29/2022 09/30/2022  Labs for ITP Cardiac and Pulmonary Rehab  Cholestrol 0 - 200 mg/dL 245  200   LDL (calc) 0 - 99 mg/dL 103  111   HDL-C >40 mg/dL 39  43   Trlycerides <150 mg/dL 602  229      Exercise Target Goals: Exercise Program Goal: Individual exercise prescription set using results from initial 6 min walk test and THRR while considering  patient's activity barriers and safety.   Exercise Prescription Goal: Initial exercise prescription builds to 30-45 minutes a day of aerobic activity, 2-3 days per week.  Home exercise guidelines will be given to patient during program as part of exercise prescription that the participant will  acknowledge.   Education: Aerobic Exercise: - Group verbal and visual presentation on the components of exercise prescription. Introduces F.I.T.T principle from ACSM for exercise prescriptions.  Reviews F.I.T.T. principles of aerobic exercise including progression. Written material given at graduation. Flowsheet Row Cardiac Rehab from 10/07/2022 in [facility] Cardiac and Pulmonary Rehab  Education need identified 10/07/22       Education: Resistance Exercise: - Group verbal and visual presentation on the components of exercise prescription. Introduces F.I.T.T principle from ACSM for exercise prescriptions  Reviews F.I.T.T. principles of resistance exercise including progression. Written material given at graduation.    Education: Exercise & Equipment Safety: - Individual verbal instruction and demonstration of equipment use and safety with use of the equipment. Flowsheet Row Cardiac Rehab from 10/07/2022 in [facility] Cardiac and Pulmonary Rehab  Education need identified 10/07/22  Date 10/07/22  Educator [place]  Instruction Review Code 1- Verbalizes Understanding       Education: Exercise Physiology & General Exercise Guidelines: - Group verbal and written instruction with models to review the exercise physiology of the cardiovascular system and associated critical values. Provides general exercise guidelines with specific guidelines to those with heart or lung disease.  Flowsheet Row Cardiac Rehab from 10/07/2022 in [facility] Cardiac and Pulmonary Rehab  Education need identified 10/07/22       Education: Flexibility, Balance, Mind/Body Relaxation: - Group verbal and visual presentation with interactive activity on the components of exercise prescription. Introduces F.I.T.T principle from ACSM for exercise prescriptions. Reviews F.I.T.T. principles of flexibility and balance exercise training including progression. Also discusses the mind body connection.  Reviews various relaxation techniques to  help reduce and manage stress (i.e. Deep breathing, progressive muscle relaxation, and visualization). Balance handout provided to take home. Written material given at graduation.   Activity Barriers & Risk Stratification:  Activity Barriers & Cardiac Risk Stratification - 10/06/22 1435       Activity Barriers & Cardiac Risk Stratification   Activity Barriers Back Problems   back pain but xray clear   Cardiac Risk Stratification High   still having chest pain            6 Minute Walk:  6 Minute Walk     Row Name 10/07/22 1711         6 Minute Walk   Phase Initial     Distance 1215 feet     Walk Time 6 minutes     # of  Rest Breaks 0     MPH 2.3     METS 3.37     RPE 15     Perceived Dyspnea  3     VO2 Peak 11.81     Symptoms Yes (comment)     Comments Chest pain 6/10 - resolved with rest, SOB     Resting HR 65 bpm     Resting BP 122/72     Resting Oxygen Saturation  97 %     Exercise Oxygen Saturation  during 6 min walk 98 %     Max Ex. HR 111 bpm     Max Ex. BP 154/70     2 Minute Post BP 110/68              Oxygen Initial Assessment:   Oxygen Re-Evaluation:   Oxygen Discharge (Final Oxygen Re-Evaluation):   Initial Exercise Prescription:  Initial Exercise Prescription - 10/07/22 1700       Date of Initial Exercise RX and Referring Provider   Date 10/07/22    Referring Provider Kathlyn Sacramento MD      Treadmill   MPH 1.8    Grade 0    Minutes 15    METs 2.38      Recumbant Bike   Level 1    RPM 60    Watts 30    Minutes 15    METs 3.3      NuStep   Level 1    SPM 80    Minutes 15    METs 3.3      REL-XR   Level 1    Speed 50    Minutes 15    METs 3.3      T5 Nustep   Level 1    SPM 80    Minutes 15    METs 3.3      Prescription Details   Frequency (times per week) 3    Duration Progress to 30 minutes of continuous aerobic without signs/symptoms of physical distress      Intensity   THRR 40-80% of Max Heartrate 105  - 146    Ratings of Perceived Exertion 11-13    Perceived Dyspnea 0-4      Progression   Progression Continue to progress workloads to maintain intensity without signs/symptoms of physical distress.      Resistance Training   Training Prescription Yes    Weight 5 lb    Reps 10-15             Perform Capillary Blood Glucose checks as needed.  Exercise Prescription Changes:   Exercise Prescription Changes     Row Name 10/07/22 1700             Response to Exercise   Blood Pressure (Admit) 122/72       Blood Pressure (Exercise) 154/70       Blood Pressure (Exit) 110/68       Heart Rate (Admit) 65 bpm       Heart Rate (Exercise) 111 bpm       Heart Rate (Exit) 65 bpm       Oxygen Saturation (Admit) 97 %       Oxygen Saturation (Exercise) 98 %       Rating of Perceived Exertion (Exercise) 15       Perceived Dyspnea (Exercise) 3       Symptoms chest pain 6/10 , resolved with rest; SOB       Comments walk  test results                Exercise Comments:   Exercise Goals and Review:   Exercise Goals     Row Name 10/07/22 1717             Exercise Goals   Increase Physical Activity Yes       Intervention Develop an individualized exercise prescription for aerobic and resistive training based on initial evaluation findings, risk stratification, comorbidities and participant's personal goals.;Provide advice, education, support and counseling about physical activity/exercise needs.       Expected Outcomes Short Term: Attend rehab on a regular basis to increase amount of physical activity.;Long Term: Add in home exercise to make exercise part of routine and to increase amount of physical activity.;Long Term: Exercising regularly at least 3-5 days a week.       Increase Strength and Stamina Yes       Intervention Provide advice, education, support and counseling about physical activity/exercise needs.;Develop an individualized exercise prescription for aerobic and  resistive training based on initial evaluation findings, risk stratification, comorbidities and participant's personal goals.       Expected Outcomes Short Term: Increase workloads from initial exercise prescription for resistance, speed, and METs.;Short Term: Perform resistance training exercises routinely during rehab and add in resistance training at home;Long Term: Improve cardiorespiratory fitness, muscular endurance and strength as measured by increased METs and functional capacity (6MWT)       Able to understand and use rate of perceived exertion (RPE) scale Yes       Intervention Provide education and explanation on how to use RPE scale       Expected Outcomes Short Term: Able to use RPE daily in rehab to express subjective intensity level;Long Term:  Able to use RPE to guide intensity level when exercising independently       Able to understand and use Dyspnea scale Yes       Intervention Provide education and explanation on how to use Dyspnea scale       Expected Outcomes Short Term: Able to use Dyspnea scale daily in rehab to express subjective sense of shortness of breath during exertion;Long Term: Able to use Dyspnea scale to guide intensity level when exercising independently       Knowledge and understanding of Target Heart Rate Range (THRR) Yes       Intervention Provide education and explanation of THRR including how the numbers were predicted and where they are located for reference       Expected Outcomes Short Term: Able to state/look up THRR;Long Term: Able to use THRR to govern intensity when exercising independently;Short Term: Able to use daily as guideline for intensity in rehab       Able to check pulse independently Yes       Intervention Provide education and demonstration on how to check pulse in carotid and radial arteries.;Review the importance of being able to check your own pulse for safety during independent exercise       Expected Outcomes Long Term: Able to check  pulse independently and accurately;Short Term: Able to explain why pulse checking is important during independent exercise       Understanding of Exercise Prescription Yes       Intervention Provide education, explanation, and written materials on patient's individual exercise prescription       Expected Outcomes Short Term: Able to explain program exercise prescription;Long Term: Able to explain home exercise prescription to exercise  independently                Exercise Goals Re-Evaluation :   Discharge Exercise Prescription (Final Exercise Prescription Changes):  Exercise Prescription Changes - 10/07/22 1700       Response to Exercise   Blood Pressure (Admit) 122/72    Blood Pressure (Exercise) 154/70    Blood Pressure (Exit) 110/68    Heart Rate (Admit) 65 bpm    Heart Rate (Exercise) 111 bpm    Heart Rate (Exit) 65 bpm    Oxygen Saturation (Admit) 97 %    Oxygen Saturation (Exercise) 98 %    Rating of Perceived Exertion (Exercise) 15    Perceived Dyspnea (Exercise) 3    Symptoms chest pain 6/10 , resolved with rest; SOB    Comments walk test results             Nutrition:  Target Goals: Understanding of nutrition guidelines, daily intake of sodium '1500mg'$ , cholesterol '200mg'$ , calories 30% from fat and 7% or less from saturated fats, daily to have 5 or more servings of fruits and vegetables.  Education: All About Nutrition: -Group instruction provided by verbal, written material, interactive activities, discussions, models, and posters to present general guidelines for heart healthy nutrition including fat, fiber, MyPlate, the role of sodium in heart healthy nutrition, utilization of the nutrition label, and utilization of this knowledge for meal planning. Follow up email sent as well. Written material given at graduation. Flowsheet Row Cardiac Rehab from 10/07/2022 in [facility] Cardiac and Pulmonary Rehab  Education need identified 10/07/22       Biometrics:  Pre  Biometrics - 10/07/22 1712       Pre Biometrics   Height 5' 8.3" (1.735 m)    Weight 250 lb 14.4 oz (113.8 kg)    Waist Circumference 54 inches    Hip Circumference 53.5 inches    Waist to Hip Ratio 1.01 %    BMI (Calculated) 37.81    Single Leg Stand 30 seconds              Nutrition Therapy Plan and Nutrition Goals:  Nutrition Therapy & Goals - 10/06/22 1506       Personal Nutrition Goals   Comments Hx: Diverticulitis and hemorrhoids (very picky eater due to history and taste buds)             Nutrition Assessments:  MEDIFICTS Score Key: ?70 Need to make dietary changes  40-70 Heart Healthy Diet ? 40 Therapeutic Level Cholesterol Diet  Flowsheet Row Cardiac Rehab from 10/07/2022 in [facility] Cardiac and Pulmonary Rehab  Picture Your Plate Total Score on Admission 32      Picture Your Plate Scores: <36 Unhealthy dietary pattern with much room for improvement. 41-50 Dietary pattern unlikely to meet recommendations for good health and room for improvement. 51-60 More healthful dietary pattern, with some room for improvement.  >60 Healthy dietary pattern, although there may be some specific behaviors that could be improved.    Nutrition Goals Re-Evaluation:   Nutrition Goals Discharge (Final Nutrition Goals Re-Evaluation):   Psychosocial: Target Goals: Acknowledge presence or absence of significant depression and/or stress, maximize coping skills, provide positive support system. Participant is able to verbalize types and ability to use techniques and skills needed for reducing stress and depression.   Education: Stress, Anxiety, and Depression - Group verbal and visual presentation to define topics covered.  Reviews how body is impacted by stress, anxiety, and depression.  Also discusses healthy ways to  reduce stress and to treat/manage anxiety and depression.  Written material given at graduation.   Education: Sleep Hygiene -Provides group verbal and  written instruction about how sleep can affect your health.  Define sleep hygiene, discuss sleep cycles and impact of sleep habits. Review good sleep hygiene tips.    Initial Review & Psychosocial Screening:  Initial Psych Review & Screening - 10/06/22 1444       Initial Review   Current issues with Current Sleep Concerns;Current Stress Concerns    Source of Stress Concerns Unable to participate in former interests or hobbies;Unable to perform yard/household activities      [place]? Yes   family     Barriers   Psychosocial barriers to participate in program There are no identifiable barriers or psychosocial needs.;The patient should benefit from training in stress management and relaxation.      Screening Interventions   Interventions Encouraged to exercise;Provide feedback about the scores to participant;To provide support and resources with identified psychosocial needs    Expected Outcomes Short Term goal: Utilizing psychosocial counselor, staff and physician to assist with identification of specific Stressors or current issues interfering with healing process. Setting desired goal for each stressor or current issue identified.;Long Term Goal: Stressors or current issues are controlled or eliminated.;Short Term goal: Identification and review with participant of any Quality of Life or Depression concerns found by scoring the questionnaire.;Long Term goal: The participant improves quality of Life and PHQ9 Scores as seen by post scores and/or verbalization of changes             Quality of Life Scores:   Quality of Life - 10/07/22 1631       Quality of Life   Select Quality of Life      Quality of Life Scores   Health/Function Pre 12.33 %    Socioeconomic Pre 24.25 %    Psych/Spiritual Pre 14.71 %    Family Pre 22.8 %    GLOBAL Pre 17.03 %            Scores of 19 and below usually indicate a poorer quality of life in these areas.  A  difference of  2-3 points is a clinically meaningful difference.  A difference of 2-3 points in the total score of the Quality of Life Index has been associated with significant improvement in overall quality of life, self-image, physical symptoms, and general health in studies assessing change in quality of life.  PHQ-9: Review Flowsheet       10/07/2022  Depression screen PHQ 2/9  Decreased Interest 1  Down, Depressed, Hopeless 1  PHQ - 2 Score 2  Altered sleeping 1  Tired, decreased energy 3  Change in appetite 1  Feeling bad or failure about yourself  0  Trouble concentrating 0  Moving slowly or fidgety/restless 0  Suicidal thoughts 0  PHQ-9 Score 7  Difficult doing work/chores Somewhat difficult   Interpretation of Total Score  Total Score Depression Severity:  1-4 = Minimal depression, 5-9 = Mild depression, 10-14 = Moderate depression, 15-19 = Moderately severe depression, 20-27 = Severe depression   Psychosocial Evaluation and Intervention:  Psychosocial Evaluation - 10/06/22 1456       Psychosocial Evaluation & Interventions   Interventions Encouraged to exercise with the program and follow exercise prescription;Relaxation education;Stress management education    Comments Andrea [name] is coming to 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 She is still having some chest pain, which her doctors  are aware. She has new medication since the MI that she is adjusting to. Since her MI, she has been having headaches which she doesn't know if its medication related to due to possible anemia. She has a long GI history, including diverticulitis and hemorrhoids. When asked about stress, anxiety, and depression, she admits that she has been feeling all those things since her MI last week She is more teary and feels like she can't keep up. She knows its temporary, but stated it hurts because she can't follow her two year old granddaughter around like she wants. She is very motivated to come to the program to  start an exercise routine and to get help with nutrition. She is very proud that she quit smoking 06/25/22 cold [place] and has no desire to start back.    Expected Outcomes Short: attend cardiac rehab for education and exercise. Long: develop and maintain positive self care habits.    Continue Psychosocial Services  Follow up required by staff             Psychosocial Re-Evaluation:   Psychosocial Discharge (Final Psychosocial Re-Evaluation):   Vocational Rehabilitation: Provide vocational rehab assistance to qualifying candidates.   Vocational Rehab Evaluation & Intervention:  Vocational Rehab - 10/06/22 1438       Initial Vocational Rehab Evaluation & Intervention   Assessment shows need for Vocational Rehabilitation No             Education: Education Goals: Education classes will be provided on a variety of topics geared toward better understanding of heart health and risk factor modification. Participant will state understanding/return demonstration of topics presented as noted by education test scores.  Learning Barriers/Preferences:  Learning Barriers/Preferences - 10/06/22 1436       Learning Barriers/Preferences   Learning Barriers None    Learning Preferences None             General Cardiac Education Topics:  AED/CPR: - Group verbal and written instruction with the use of models to demonstrate the basic use of the AED with the basic ABC's of resuscitation.   Anatomy and Cardiac Procedures: - Group verbal and visual presentation and models provide information about basic cardiac anatomy and function. Reviews the testing methods done to diagnose heart disease and the outcomes of the test results. Describes the treatment choices: Medical Management, Angioplasty, or Coronary Bypass Surgery for treating various heart conditions including Myocardial Infarction, Angina, Valve Disease, and Cardiac Arrhythmias.  Written material given at  graduation.   Medication Safety: - Group verbal and visual instruction to review commonly prescribed medications for heart and lung disease. Reviews the medication, class of the drug, and side effects. Includes the steps to properly store meds and maintain the prescription regimen.  Written material given at graduation.   Intimacy: - Group verbal instruction through game format to discuss how heart and lung disease can affect sexual intimacy. Written material given at graduation..   Know Your Numbers and Heart Failure: - Group verbal and visual instruction to discuss disease risk factors for cardiac and pulmonary disease and treatment options.  Reviews associated critical values for Overweight/Obesity, Hypertension, Cholesterol, and Diabetes.  Discusses basics of heart failure: signs/symptoms and treatments.  Introduces Heart Failure Zone chart for action plan for heart failure.  Written material given at graduation.   Infection Prevention: - Provides verbal and written material to individual with discussion of infection control including proper hand washing and proper equipment cleaning during exercise session. Flowsheet Row Cardiac Rehab from  10/07/2022 in [facility] Cardiac and Pulmonary Rehab  Education need identified 10/07/22  Date 10/07/22  Educator [place]  Instruction Review Code 1- Verbalizes Understanding       Falls Prevention: - Provides verbal and written material to individual with discussion of falls prevention and safety. Flowsheet Row Cardiac Rehab from 10/07/2022 in [facility] Cardiac and Pulmonary Rehab  Education need identified 10/07/22  Date 10/07/22  Educator [place]  Instruction Review Code 1- Verbalizes Understanding       Other: -Provides group and verbal instruction on various topics (see comments)   Knowledge Questionnaire Score:  Knowledge Questionnaire Score - 10/07/22 1631       Knowledge Questionnaire Score   Pre Score 25/26             Core  Components/Risk Factors/Patient Goals at Admission:  Personal Goals and Risk Factors at Admission - 10/07/22 1717       Core Components/Risk Factors/Patient Goals on Admission    Weight Management Yes;Weight Loss;Obesity    Intervention Weight Management: Develop a combined nutrition and exercise program designed to reach desired caloric intake, while maintaining appropriate intake of nutrient and fiber, sodium and fats, and appropriate energy expenditure required for the weight goal.;Weight Management: Provide education and appropriate resources to help participant work on and attain dietary goals.;Weight Management/Obesity: Establish reasonable short term and long term weight goals.;Obesity: Provide education and appropriate resources to help participant work on and attain dietary goals.    Admit Weight 250 lb (113.4 kg)    Goal Weight: Short Term 245 lb (111.1 kg)    Goal Weight: Long Term 180 lb (81.6 kg)   Per patient   Expected Outcomes Short Term: Continue to assess and modify interventions until short term weight is achieved;Long Term: Adherence to nutrition and physical activity/exercise program aimed toward attainment of established weight goal;Weight Loss: Understanding of general recommendations for a balanced deficit meal plan, which promotes 1-2 lb weight loss per week and includes a negative energy balance of [phone] kcal/d;Understanding recommendations for meals to include 15-35% energy as protein, 25-35% energy from fat, 35-60% energy from carbohydrates, less than '200mg'$  of dietary cholesterol, 20-35 gm of total fiber daily;Understanding of distribution of calorie intake throughout the day with the consumption of 4-5 meals/snacks    Hypertension Yes    Intervention Monitor prescription use compliance.;Provide education on lifestyle modifcations including regular physical activity/exercise, weight management, moderate sodium restriction and increased consumption of fresh fruit,  vegetables, and low fat dairy, alcohol moderation, and smoking cessation.    Expected Outcomes Short Term: Continued assessment and intervention until BP is < 140/95m HG in hypertensive participants. < 130/822mHG in hypertensive participants with diabetes, heart failure or chronic kidney disease.;Long Term: Maintenance of blood pressure at goal levels.    Lipids Yes    Intervention Provide education and support for participant on nutrition & aerobic/resistive exercise along with prescribed medications to achieve LDL '70mg'$ , HDL >'40mg'$ .    Expected Outcomes Short Term: Participant states understanding of desired cholesterol values and is compliant with medications prescribed. Participant is following exercise prescription and nutrition guidelines.;Long Term: Cholesterol controlled with medications as prescribed, with individualized exercise RX and with personalized nutrition plan. Value goals: LDL < '70mg'$ , HDL > 40 mg.             Education:Diabetes - Individual verbal and written instruction to review signs/symptoms of diabetes, desired ranges of glucose level fasting, after meals and with exercise. Acknowledge that pre and post exercise glucose checks will be  done for 3 sessions at entry of program.   Core Components/Risk Factors/Patient Goals Review:    Core Components/Risk Factors/Patient Goals at Discharge (Final Review):    ITP Comments:  ITP Comments     Row Name 10/06/22 1502 10/07/22 1628 10/07/22 1718 10/07/22 1722     ITP Comments Initial phone call completed. Diagnosis can be found in [facility] 12/6. EP Orientation scheduled for Wednesday 12/13 at 2:30. Completed 6MWT and gym orientation. Initial ITP created and sent for review to Dr. Emily Filbert, Medical Director. [name] has recently quit tobacco use within the last 6 months. Her quite date was 06/26/22. Intervention for relapse prevention was provided at the initial medical review. He was encouraged to continue to with tobacco  cessation and was provided information on relapse prevention. Patient received information about combination therapy, tobacco cessation classes, quit line, and quit smoking apps in case of a relapse. Patient demonstrated understanding of this material.Staff will continue to provide encouragement and follow up with the patient throughout the program. Patient was advised to follow up with Dr. Fletcher Anon first before returning to rehab. Per D/C notes, patient was supposed to have a hospital follow up. Patient is experiencing recurrent chest pain during activity. Small EKG changes noted today after 6MWT. Chest pain resolves at rest. EKG strips sent over to Dr. Fletcher Anon for review and will receive clearance for patient to return. Staff discussed with patient when to call 911.             Comments: Initial ITP

## 2022-10-08 ENCOUNTER — Encounter: Payer: 59 | Admitting: *Deleted

## 2022-10-08 ENCOUNTER — Telehealth: Payer: Self-pay | Admitting: Cardiovascular Disease

## 2022-10-08 NOTE — Telephone Encounter (Signed)
Returned the call to cardiac rehab. She stated that the patient came in yesterday for her orientation and had complaints of chest pain on exertion. She did have chest pain during the 6 minute walk test that eased up when she rested.   Notes and strips are under media. The patient is temporarily on hold with rehab until further assessment from cardiology due to the chest pain.   Patient does have a follow up tomorrow.

## 2022-10-08 NOTE — Telephone Encounter (Signed)
Doctor called to see if Dr. Fletcher Anon received fax that was sent. Please call back to discuss future appts

## 2022-10-09 ENCOUNTER — Other Ambulatory Visit: Payer: Self-pay

## 2022-10-09 ENCOUNTER — Ambulatory Visit: Payer: 59 | Attending: Medical | Admitting: Medical

## 2022-10-09 ENCOUNTER — Other Ambulatory Visit
Admission: RE | Admit: 2022-10-09 | Discharge: 2022-10-09 | Disposition: A | Discharge status: Home / Self Care | Payer: 59 | Source: Ambulatory Visit | Attending: Medical | Admitting: Medical

## 2022-10-09 ENCOUNTER — Ambulatory Visit: Payer: 59

## 2022-10-09 ENCOUNTER — Encounter: Payer: Self-pay | Admitting: Medical

## 2022-10-09 VITALS — BP 100/60 | HR 64 | Ht 67.0 in | Wt 250.0 lb

## 2022-10-09 DIAGNOSIS — Z79899 Other long term (current) drug therapy: Secondary | ICD-10-CM | POA: Insufficient documentation

## 2022-10-09 DIAGNOSIS — I214 Non-ST elevation (NSTEMI) myocardial infarction: Secondary | ICD-10-CM

## 2022-10-09 DIAGNOSIS — R0789 Other chest pain: Secondary | ICD-10-CM

## 2022-10-09 DIAGNOSIS — I471 Supraventricular tachycardia, unspecified: Secondary | ICD-10-CM

## 2022-10-09 DIAGNOSIS — E782 Mixed hyperlipidemia: Secondary | ICD-10-CM

## 2022-10-09 DIAGNOSIS — R002 Palpitations: Secondary | ICD-10-CM

## 2022-10-09 DIAGNOSIS — I700 Atherosclerosis of aorta: Secondary | ICD-10-CM

## 2022-10-09 LAB — CBC
HCT: 35.6 % — ABNORMAL LOW (ref 36.0–46.0)
Hemoglobin: 10.8 g/dL — ABNORMAL LOW (ref 12.0–15.0)
MCH: 25.1 pg — ABNORMAL LOW (ref 26.0–34.0)
MCHC: 30.3 g/dL (ref 30.0–36.0)
MCV: 82.6 fL (ref 80.0–100.0)
Platelets: 332 10*3/uL (ref 150–400)
RBC: 4.31 MIL/uL (ref 3.87–5.11)
RDW: 15.3 % (ref 11.5–15.5)
WBC: 7.5 10*3/uL (ref 4.0–10.5)
nRBC: 0 % (ref 0.0–0.2)

## 2022-10-09 MED ORDER — DILTIAZEM HCL ER COATED BEADS 120 MG PO CP24: 120.0000 mg | ORAL_CAPSULE | Freq: Every day | ORAL | 0 refills | Status: DC

## 2022-10-09 MED ORDER — FENOFIBRATE 160 MG PO TABS: 160.0000 mg | ORAL_TABLET | Freq: Every day | ORAL | 3 refills | Status: DC

## 2022-10-09 MED ORDER — CLOPIDOGREL BISULFATE 75 MG PO TABS: 75.0000 mg | ORAL_TABLET | Freq: Every day | ORAL | 3 refills | Status: DC

## 2022-10-09 MED ORDER — ATORVASTATIN CALCIUM 40 MG PO TABS: 40.0000 mg | ORAL_TABLET | Freq: Every evening | ORAL | 3 refills | Status: DC

## 2022-10-09 MED ORDER — DILTIAZEM HCL ER COATED BEADS 120 MG PO CP24: 120.0000 mg | ORAL_CAPSULE | Freq: Every day | ORAL | 1 refills | Status: DC

## 2022-10-09 NOTE — Patient Instructions (Signed)
Medication Instructions:  - Your physician has recommended you make the following change in your medication:   1) STOP Imdur (Isosorbide)  2) STOP Lopressor (Metoprolol Tartrate)  3) START Diltiazem 120 mg: - take 1 capsule by mouth once daily   *If you need a refill on your cardiac medications before your next appointment, please call your pharmacy*   Lab Work: - Your physician recommends that you have lab work today:  Arboriculturist at [facility] 1st desk on the right to check in (REGISTRATION)  Lab hours: Monday- Friday (7:30 am- 5:30 pm)   If you have labs (blood work) drawn today and your tests are completely normal, you will receive your results only by: MyChart Message (if you have MyChart) OR A paper copy in the mail If you have any lab test that is abnormal or we need to change your treatment, we will call you to review the results.   Testing/Procedures: - Heart Monitor:  Length of Wear: 14 days  Your monitor will be mailed to your home address within 3-5 business days. However, if you have not received your monitor after 5 business days please send [place] a MyChart message or call the office at [phone], so we may follow up on this for you.   Your physician has recommended that you wear a Zio XT (heart) monitor.   This monitor is a medical device that records the heart's electrical activity. Doctors most often use these monitors to diagnose arrhythmias. Arrhythmias are problems with the speed or rhythm of the heartbeat. The monitor is a small device applied to your chest. You can wear one while you do your normal daily activities. While wearing this monitor if you have any symptoms to push the button and record what you felt. Once you have worn this monitor for the period of time provider prescribed (Usually 14 days), you will return the monitor device in the postage paid box. Once it is returned they will download the data collected and provide [place] with a  report which the provider will then review and we will call you with those results. Important tips:  Avoid showering during the first 24 hours of wearing the monitor. Avoid excessive sweating to help maximize wear time. Do not submerge the device, no hot tubs, and no swimming pools. Keep any lotions or oils away from the patch. After 24 hours you may shower with the patch on. Take brief showers with your back facing the shower head.  Do not remove patch once it has been placed because that will interrupt data and decrease adhesive wear time. Push the button when you have any symptoms and write down what you were feeling. Once you have completed wearing your monitor, remove and place into box which has postage paid and place in your outgoing mailbox.  If for some reason you have misplaced your box then call our office and we can provide another box and/or mail it off for you.       Follow-Up: At [facility], you and your health needs are our priority.  As part of our continuing mission to provide you with exceptional heart care, we have created designated Provider Care Teams.  These Care Teams include your primary Cardiologist (physician) and Advanced Practice Providers (APPs -  Physician Assistants and Nurse Practitioners) who all work together to provide you with the care you need, when you need it.  We recommend signing up for the patient portal called "MyChart".  Sign up information is provided on this After Visit Summary.  MyChart is used to connect with patients for Virtual Visits (Telemedicine).  Patients are able to view lab/test results, encounter notes, upcoming appointments, etc.  Non-urgent messages can be sent to your provider as well.   To learn more about what you can do with MyChart, go to [email or web address].    Your next appointment:   6-8 week(s)  The format for your next appointment:   In Person  Provider:   You may see Mertie Moores, MD or one of the  following Advanced Practice Providers on your designated Care Team:    Cadence Kathlen Mody, [place] Other Instructions  Diltiazem Extended-Release Capsules or Tablets What is this medication? DILTIAZEM (dil TYE a zem) treats high blood pressure and prevents chest pain (angina). It works by relaxing the blood vessels, which helps decrease the amount of work your heart has to do. It belongs to a group of medications called calcium channel blockers. This medicine may be used for other purposes; ask your health care provider or pharmacist if you have questions. COMMON BRAND NAME(S): Cardizem CD, Cardizem LA, Cardizem SR, Cartia XT, Dilacor XR, Dilt-CD, Diltia XT, Diltzac, Matzim LA, Rema Fendt, TIADYLT ER, Tiamate, Tiazac What should I tell my care team before I take this medication? They need to know if you have any of these conditions: Heart attack Heart disease Irregular heartbeat or rhythm Low blood pressure An unusual or allergic reaction to diltiazem, other medications, foods, dyes, or preservatives Pregnant or trying to get pregnant Breast-feeding How should I use this medication? Take this medication by mouth. Take it as directed on the prescription label at the same time every day. Do not cut, crush or chew this medication. Swallow the capsules whole. You can take it with or without food. If it upsets your stomach, take it with food. Keep taking it unless your care team tells you to stop. Talk to your care team about the use of this medication in children. Special care may be needed. Overdosage: If you think you have taken too much of this medicine contact a poison control center or emergency room at once. NOTE: This medicine is only for you. Do not share this medicine with others. What if I miss a dose? If you miss a dose, take it as soon as you can. If it is almost time for your next dose, take only that dose. Do not take double or extra doses. What may interact with this medication? Do not  take this medication with any of the following: Cisapride [place] Pimozide Ranolazine Red yeast rice This medication may also interact with the following: Buspirone Carbamazepine Cimetidine Cyclosporine Digoxin Local anesthetics or general anesthetics Lovastatin Medications for anxiety or difficulty sleeping like midazolam and triazolam Medications for high blood pressure or heart problems Quinidine Rifampin, rifabutin, or rifapentine This list may not describe all possible interactions. Give your health care provider a list of all the medicines, herbs, non-prescription drugs, or dietary supplements you use. Also tell them if you smoke, drink alcohol, or use illegal drugs. Some items may interact with your medicine. What should I watch for while using this medication? Visit your care team for regular checks on your progress. Check your blood pressure as directed. Ask your care team what your blood pressure should be. Do not treat yourself for coughs, colds, or pain while you are using this medication without asking your care team for advice. Some medications may increase your blood pressure. This  medication may cause serious skin reactions. They can happen weeks to months after starting the medication. Contact your care team right away if you notice fevers or flu-like symptoms with a rash. The rash may be red or purple and then turn into blisters or peeling of the skin. Or, you might notice a red rash with swelling of the face, lips or lymph nodes in your neck or under your arms. You may get drowsy or dizzy. Do not drive, use machinery, or do anything that needs mental alertness until you know how this medication affects you. Do not stand up or sit up quickly, especially if you are an older patient. This reduces the risk of dizzy or fainting spells. What side effects may I notice from receiving this medication? Side effects that you should report to your care team as soon as  possible: Allergic reactions--skin rash, itching, hives, swelling of the face, lips, tongue, or throat Heart failure--shortness of breath, swelling of the ankles, feet, or hands, sudden weight gain, unusual weakness or fatigue Slow heartbeat--dizziness, feeling faint or lightheaded, trouble breathing, unusual weakness or fatigue Liver injury--right upper belly pain, loss of appetite, nausea, light-colored stool, dark yellow or brown urine, yellowing skin or eyes, unusual weakness or fatigue Low blood pressure--dizziness, feeling faint or lightheaded, blurry vision Redness, blistering, peeling, or loosening of the skin, including inside the mouth Side effects that usually do not require medical attention (report to your care team if they continue or are bothersome): Constipation Facial flushing, redness Headache This list may not describe all possible side effects. Call your doctor for medical advice about side effects. You may report side effects to FDA at 1-800-FDA-1088. Where should I keep my medication? Keep out of the reach of children and pets. Store at room temperature between 20 and 25 degrees C (68 and 77 degrees F). Protect from moisture. Keep the container tightly closed. Throw away any unused medication after the expiration date. NOTE: This sheet is a summary. It may not cover all possible information. If you have questions about this medicine, talk to your doctor, pharmacist, or health care provider.  2023 Elsevier/Gold Standard (2007-12-03 00:00:00)   Important Information About Sugar

## 2022-10-09 NOTE — Telephone Encounter (Signed)
90 day Rx request.

## 2022-10-09 NOTE — Progress Notes (Signed)
Cardiology Office Note:    Date:  10/09/2022   ID:  Andrea [name], Andrea [name] Mar 17, 1971, MRN [id number]  PCP:  Andrea Panda, MD  [facility] Cardiologist:  Mertie Moores, MD  [facility] Electrophysiologist:  None   Referring MD: Andrea Panda, MD   Chief Complaint: hospital follow-up  History of Present Illness:    Andrea [name] is a 51 y.o. female with a hx of HTN, dyslipidemia, fatigue, GERD, h/o palpitations, iron deficinecy anemia, former smoker, fmaily history of early onset CAD who is being seen for hospital follow-up for NSTEMI.   She was evaluated in November 2018 for several months of chest pain. She underwent Coronary CTA with a coronary calciums score of 0.   Recently admitted for NSTEMI. Echo showed normal LVSF. Myoview leixscna was abnormal so she underwent cardiac cath. This showed normal coronaries. She was started on Imdur.   Today, the patient reports she has persistent chest pain. She reports shortness of breath with exertion. She had EKG changes at cardiac rehab and they canceled it until she could be seen.   EKG strips showed ST depression with TWI, which baseline EKG shows TWI lateral leads.   She reports chest pain in the center of the chest with associated shortness of breath. Chest pain resolves when she sits down. Chest pain is worse with activity. She has GERD, she takes protonix. No lower leg edema, orthopnea or pnd. She reports improvement in chest pain when she lays down. No active chest pain. She is taking Imdur at night, however this is giving her a headache and doesn't improve the chest pain. She has anemia with Hgb dropped from 13>9.9 over the lat year. She has diverticulitis and hemroids. She likely needs hemorrhoidectomy. She says iron pills upset her stomach.  Past Medical History:  Diagnosis Date   Abnormal Pap smear of cervix    2010-2014 abnormal pap every 3-6 months   dysplastic squamous cells of cervix    Anxiety    occ.- no meds    Blood in stool    Depression    no meds   Diverticulitis 08/2019   DUB (dysfunctional uterine bleeding)    Dyslipidemia    Dyspnea    Resolved - due to anemia - no current problems   Eczema    Endometriosis    Epigastric pain    Fatigue    Fibroid    GERD (gastroesophageal reflux disease)    Grade I diastolic dysfunction 35/36/1443   Noted on ECHO   Hemorrhoid    2nd and 3rd degree   History of adenomatous polyp of colon    History of palpitations    HSV infection    Hx of dysplastic nevus 07/17/2019   L upper back 4.0cm lateral to spine, moderate   Hyperlipidemia    diet controlled - no meds   Hypertension    not currently taking medication   Infertility, female    Iron deficiency anemia    Last iron infusion 09/14/2018   Left groin hernia    history of   Muscle cramping    with exertion   Obesity    Palpitations    Recurrent bacterial infection    Vaginal   Restless leg    due to anemia   Smoker    SVD (spontaneous vaginal delivery)    x 2   Thoracic back pain    while trying to sleep   Tinnitus, left    hears heart beat, MRI  normal   Vertigo 2018    Past Surgical History:  Procedure Laterality Date   ABLATION ON ENDOMETRIOSIS  1995   anemia     due to gyn blood losses from uterine fibroids.  received PRBCs and iron infusions.     CERVICAL BIOPSY  W/ LOOP ELECTRODE EXCISION     CERVICAL CONIZATION W/BX N/A 09/20/2018   Procedure: CONIZATION CERVIX WITH BIOPSY;  Surgeon: Nunzio Cobbs, MD;  Location: [facility];  Service: Gynecology;  Laterality: N/A;   COLONOSCOPY  06/2018   Dr Earlie Raveling.  9 mm sessile, hyperplastic sigmoid polyp.  diverticulosis.  int hemorrhoids.  2 cm cecal lipoma   COLPOSCOPY     x 3   CYSTOSCOPY N/A 11/14/2018   Procedure: CYSTOSCOPY;  Surgeon: Nunzio Cobbs, MD;  Location: [facility];  Service: Gynecology;  Laterality: N/A;   DILATION AND CURETTAGE OF UTERUS  1993   DILATION AND CURETTAGE  OF UTERUS N/A 09/20/2018   Procedure: FRACTIONAL DILATATION AND CURETTAGE;  Surgeon: Nunzio Cobbs, MD;  Location: [facility];  Service: Gynecology;  Laterality: N/A;   GYNECOLOGIC CRYOSURGERY     HEMORRHOID BANDING     several   HERNIA REPAIR Left    childhood   INTRAUTERINE DEVICE (IUD) INSERTION  2013    (Mirena)   INTRAUTERINE DEVICE INSERTION  2000   [place]   IUD REMOVAL  2010   IUD REMOVAL  2018   LEFT HEART CATH AND CORONARY ANGIOGRAPHY N/A 10/02/2022   Procedure: LEFT HEART CATH AND CORONARY ANGIOGRAPHY;  Surgeon: Wellington Hampshire, MD;  Location: [facility];  Service: Cardiovascular;  Laterality: N/A;   TOTAL LAPAROSCOPIC HYSTERECTOMY WITH SALPINGECTOMY Bilateral 11/14/2018   Procedure: TOTAL LAPAROSCOPIC HYSTERECTOMY WITH BILATERAL SALPINGECTOMY AND LYSIS OF ADHESIONS;  Surgeon: Nunzio Cobbs, MD;  Location: [facility];  Service: Gynecology;  Laterality: Bilateral;   TUBAL LIGATION     2010   UPPER GI ENDOSCOPY  06/2018   x 2   WISDOM TOOTH EXTRACTION      Current Medications: Current Meds  Medication Sig   acetaminophen (TYLENOL) 325 MG tablet Take 2 tablets (650 mg total) by mouth every 6 (six) hours as needed for mild pain (or temp > 100).   aspirin EC 81 MG tablet Take 1 tablet (81 mg total) by mouth daily. Swallow whole.   diltiazem (CARDIZEM CD) 120 MG 24 hr capsule Take 1 capsule (120 mg total) by mouth daily.   pantoprazole (PROTONIX) 40 MG tablet Take 1 tablet (40 mg total) by mouth daily.   [DISCONTINUED] atorvastatin (LIPITOR) 40 MG tablet Take 1 tablet (40 mg total) by mouth every evening.   [DISCONTINUED] clopidogrel (PLAVIX) 75 MG tablet Take 1 tablet (75 mg total) by mouth daily with breakfast.   [DISCONTINUED] fenofibrate 160 MG tablet Take 1 tablet (160 mg total) by mouth daily.   [DISCONTINUED] isosorbide mononitrate (IMDUR) 30 MG 24 hr tablet Take 1 tablet (30 mg total) by mouth daily.   [DISCONTINUED]  metoprolol tartrate (LOPRESSOR) 25 MG tablet Take 1 tablet (25 mg total) by mouth 2 (two) times daily.     Allergies:   Patient has no known allergies.   Social History   Socioeconomic History   Marital status: Married    Spouse name: Not on file   Number of children: 2   Years of education: Not on file   Highest education level: Not  on file  Occupational History   Not on file  Tobacco Use   Smoking status: Former    Packs/day: 0.75    Years: 35.00    Total pack years: 26.25    Types: Cigarettes    Quit date: 06/25/2022    Years since quitting: 0.2   Smokeless tobacco: Never  Vaping Use   Vaping Use: Never used  Substance and Sexual Activity   Alcohol use: Yes    Comment: 4-5 drinks a week (alcohol)  Red Bull and Vodka or red wine   Drug use: Never   Sexual activity: Yes    Partners: Male    Birth control/protection: Surgical    Comment: BTL  Other Topics Concern   Not on file  Social History Narrative   Not on file   Social Determinants of Health   Financial Resource Strain: Not on file  Food Insecurity: No Food Insecurity (09/30/2022)   Hunger Vital Sign    Worried About Running Out of Food in the Last Year: Never true    Ran Out of Food in the Last Year: Never true  Transportation Needs: No Transportation Needs (09/30/2022)   PRAPARE - Hydrologist (Medical): No    Lack of Transportation (Non-Medical): No  Physical Activity: Not on file  Stress: Not on file  Social Connections: Not on file     Family History: The patient's family history includes Alzheimer's disease in her maternal grandfather; Cancer (age of onset: 39) in her mother; Heart disease in her father and paternal grandfather; Heart failure in her paternal aunt; Other in her brother, maternal grandfather, paternal grandfather, and paternal grandmother.  ROS:   Please see the history of present illness.     All other systems reviewed and are negative.  EKGs/Labs/Other  Studies Reviewed:    The following studies were reviewed today:  LHC 10/02/22   The left ventricular systolic function is normal.   LV end diastolic pressure is mildly elevated.   The left ventricular ejection fraction is 55-65% by visual estimate.   1.  Normal coronary arteries. 2.  Normal LV systolic function mildly elevated left ventricular end-diastolic pressure.   Recommendations: The patient has Brogden (myocardial infarction without obstructive coronary artery disease).  Recommend medical therapy.  I added clopidogrel to be used for 1 year.  I added Imdur 30 mg once daily. The patient can likely be discharged home later today. CMD testing can be considered as an outpatient if the patient continues to have symptoms.    Lexiscan MPI 09/30/2022:   No ST deviation was noted.   LV perfusion is abnormal. Defect 1: There is a medium defect with moderate reduction in uptake present in the mid to basal anterior location(s) that is reversible. There is normal wall motion in the defect area. Consistent with ischemia.   Left ventricular function is normal visually, calculated EF is 46%. correlation with echo advised.   No significant coronary artery calcifications noted. __________   2D echo 09/30/2022: 1. Left ventricular ejection fraction, by estimation, is 60 to 65%. The  left ventricle has normal function. The left ventricle has no regional  wall motion abnormalities. There is moderate left ventricular hypertrophy.  Left ventricular diastolic  parameters are consistent with Grade I diastolic dysfunction (impaired  relaxation).   2. Right ventricular systolic function is normal. The right ventricular  size is not well visualized.   3. The mitral valve is grossly normal. No evidence of mitral  valve  regurgitation.   4. The aortic valve was not well visualized. Aortic valve regurgitation  is not visualized.   EKG:  EKG is ordered today.  The ekg ordered today demonstrates NSR 64bpm,  TWI lateral leads, Qtc 450m  Recent Labs: 09/30/2022: ALT 16 10/02/2022: BUN 12; Creatinine, Ser 0.68; Hemoglobin 9.9; Platelets 246; Potassium 4.1; Sodium 139  Recent Lipid Panel    Component Value Date/Time   CHOL 200 09/30/2022 1442   CHOL 245 (H) 05/29/2022 1544   TRIG 229 (H) 09/30/2022 1442   HDL 43 09/30/2022 1442   HDL 39 (L) 05/29/2022 1544   CHOLHDL 4.7 09/30/2022 1442   VLDL 46 (H) 09/30/2022 1442   LDLCALC 111 (H) 09/30/2022 1442   LDLCALC 103 (H) 05/29/2022 1544     Physical Exam:    VS:  BP 100/60 (BP Location: Left Arm, Patient Position: Sitting, Cuff Size: Normal)   Pulse 64   Ht '5\' 7"'$  (1.702 m)   Wt 250 lb (113.4 kg)   LMP  (LMP Unknown) Comment: LMP 06/2018  SpO2 98%   BMI 39.16 kg/m     Wt Readings from Last 3 Encounters:  10/09/22 250 lb (113.4 kg)  10/07/22 250 lb 14.4 oz (113.8 kg)  10/02/22 249 lb 8 oz (113.2 kg)     GEN:  Well nourished, well developed in no acute distress HEENT: Normal NECK: No JVD; No carotid bruits LYMPHATICS: No lymphadenopathy CARDIAC: RRR, no murmurs, rubs, gallops RESPIRATORY:  Clear to auscultation without rales, wheezing or rhonchi  ABDOMEN: Soft, non-tender, non-distended MUSCULOSKELETAL:  No edema; No deformity  SKIN: Warm and dry NEUROLOGIC:  Alert and oriented x 3 PSYCHIATRIC:  Normal affect   ASSESSMENT:    1. Non-ST elevation (NSTEMI) myocardial infarction (HCC)   2. Chest pressure   3. Aortic calcification (HCC)   4. Mixed hyperlipidemia   5. SVT (supraventricular tachycardia)   6. Palpitations   7. Medication management   8. Hyperlipidemia, mixed    PLAN:    In order of problems listed above:  NSTEMI Chest pain Recent hospitalization for NSTEMI with troponin elevation to 396. Echo showed normal LVEF. Myoview Lexiscan was abnormal so LHC was ordered. LHC showed no coronary disease. The patient is doing cardiac rehab and they canceled it due to EKG changes until she could be seen. Strips appear to  show TWI, which baseline EKG shows TWI lateral leads. She describes worse exertional chest pain and shortness of breath since discharge. Imdur is not improving pain and is causing a headache. Cath site is stable. She did have similar pain in the past when she had SVT. I will order a 2 week heart monitor. May be ?coronary vasospasm. BP is soft. I will stop Lopressor and Imdur and started diltiazem '120mg'$  daily. It is OK to continue with cardiac rehab/exercise. Continue DAPT with Aspirin and Plavix for 1 year. She has a h/o anemia she reports is from diverticulosis and hemorrhoids. Hgb has dropped from 13>9.9 in the last year. I will check a CBC. She reports she has had iron infusions in the past and oral iron has upset her stomach. I encouraged her to try and take iron pills, at least every other day. Cardiac cath suggested testing for CMD, patient can discuss this with PCP.  HTN BP soft, stop lopressor and Imdur as above. Start diltiazem '120mg'$  daily.   HLD LDL 111, TG 229, HDL 43. Continue fenofibrate and Lipitor. Can increase Lipitor at follow-up.   pSVT pSVT  seen on heart monitor in 2022, palpitations controlled on metoprolol. Repeat heart monitor as above. Stop metoprolol and start diltiazem as above.   Disposition: Follow up in 6-8 week(s) with MD/APP    Signed, Sollie Vultaggio Ninfa Meeker, PA-C  10/09/2022 10:34 AM    [facility]

## 2022-10-12 ENCOUNTER — Encounter: Payer: 59 | Admitting: *Deleted

## 2022-10-12 DIAGNOSIS — I214 Non-ST elevation (NSTEMI) myocardial infarction: Secondary | ICD-10-CM

## 2022-10-12 NOTE — Progress Notes (Signed)
Daily Session Note  Patient Details  Name: Andrea [name] MRN: [id number] Date of Birth: 1971/10/18 Referring Provider:   Flowsheet Row Cardiac Rehab from 10/07/2022 in [facility] Cardiac and Pulmonary Rehab  Referring Provider Kathlyn Sacramento MD       Encounter Date: 10/12/2022  Check In:  Session Check In - 10/12/22 0753       Check-In   Supervising physician immediately available to respond to emergencies See telemetry face sheet for immediately available ER MD    Location [facility]-Cardiac & Pulmonary Rehab    Staff Present Darlyne Russian, RN, Doyce Para, BS, ACSM CEP, Exercise Physiologist;Joseph Tessie Fass, [place]    Virtual Visit No    Medication changes reported     Yes    Comments discontinued Lopressor and Imdur; started diltiazem 120 mg daily    Fall or balance concerns reported    No    Tobacco Cessation No Change    Warm-up and Cool-down Performed on first and last piece of equipment    Resistance Training Performed Yes    VAD Patient? No    PAD/SET Patient? No      Pain Assessment   Currently in Pain? No/denies                Social History   Tobacco Use  Smoking Status Former   Packs/day: 0.75   Years: 35.00   Total pack years: 26.25   Types: Cigarettes   Quit date: 06/25/2022   Years since quitting: 0.2  Smokeless Tobacco Never    Goals Met:  Independence with exercise equipment Exercise tolerated well No report of concerns or symptoms today Strength training completed today  Goals Unmet:  Not Applicable  Comments: First full day of exercise!  Patient was oriented to gym and equipment including functions, settings, policies, and procedures.  Patient's individual exercise prescription and treatment plan were reviewed.  All starting workloads were established based on the results of the 6 minute walk test done at initial orientation visit.  The plan for exercise progression was also introduced and progression will be customized based on  patient's performance and goals.  Clearance received from cardiology to begin cardiac rehab per ov note 10/09/22.    Dr. Emily Filbert is Medical Director for [place].  Dr. Ottie Glazier is Medical Director for [facility] Pulmonary Rehabilitation.

## 2022-10-13 ENCOUNTER — Encounter: Payer: 59 | Admitting: *Deleted

## 2022-10-13 DIAGNOSIS — I214 Non-ST elevation (NSTEMI) myocardial infarction: Secondary | ICD-10-CM | POA: Diagnosis not present

## 2022-10-13 NOTE — Progress Notes (Signed)
Daily Session Note  Patient Details  Name: Andrea [name] MRN: [id number] Date of Birth: Aug 22, 1971 Referring Provider:   Flowsheet Row Cardiac Rehab from 10/07/2022 in [facility] Cardiac and Pulmonary Rehab  Referring Provider Kathlyn Sacramento MD       Encounter Date: 10/13/2022  Check In:  Session Check In - 10/13/22 0810       Check-In   Supervising physician immediately available to respond to emergencies See telemetry face sheet for immediately available ER MD    Location [facility]-Cardiac & Pulmonary Rehab    Staff Present Heath Lark, RN, BSN, CCRP;Jessica [place], MA, RCEP, CCRP, Bertram Gala, MS, ACSM CEP, Exercise Physiologist    Virtual Visit No    Medication changes reported     No    Fall or balance concerns reported    No    Warm-up and Cool-down Performed on first and last piece of equipment    Resistance Training Performed Yes    VAD Patient? No    PAD/SET Patient? No      Pain Assessment   Currently in Pain? No/denies                Social History   Tobacco Use  Smoking Status Former   Packs/day: 0.75   Years: 35.00   Total pack years: 26.25   Types: Cigarettes   Quit date: 06/25/2022   Years since quitting: 0.3  Smokeless Tobacco Never    Goals Met:  Independence with exercise equipment Exercise tolerated well No report of concerns or symptoms today  Goals Unmet:  Not Applicable  Comments: Pt able to follow exercise prescription today without complaint.  Will continue to monitor for progression.    Dr. Emily Filbert is Medical Director for [place].  Dr. Ottie Glazier is Medical Director for [facility] Pulmonary Rehabilitation.

## 2022-10-15 ENCOUNTER — Encounter: Payer: 59 | Admitting: *Deleted

## 2022-10-15 DIAGNOSIS — I214 Non-ST elevation (NSTEMI) myocardial infarction: Secondary | ICD-10-CM | POA: Diagnosis not present

## 2022-10-15 NOTE — Progress Notes (Signed)
Daily Session Note  Patient Details  Name: Andrea [name] MRN: [id number] Date of Birth: Mar 21, 1971 Referring Provider:   Flowsheet Row Cardiac Rehab from 10/07/2022 in [facility] Cardiac and Pulmonary Rehab  Referring Provider Kathlyn Sacramento MD       Encounter Date: 10/15/2022  Check In:  Session Check In - 10/15/22 0726       Check-In   Supervising physician immediately available to respond to emergencies See telemetry face sheet for immediately available ER MD    Location [facility]-Cardiac & Pulmonary Rehab    Staff Present Heath Lark, RN, BSN, CCRP;Joseph [place], [place]    Virtual Visit No    Medication changes reported     No    Fall or balance concerns reported    No    Tobacco Cessation No Change    Current number of cigarettes/nicotine per day     0    Warm-up and Cool-down Performed on first and last piece of equipment    Resistance Training Performed Yes    VAD Patient? No    PAD/SET Patient? No      Pain Assessment   Currently in Pain? No/denies                Social History   Tobacco Use  Smoking Status Former   Packs/day: 0.75   Years: 35.00   Total pack years: 26.25   Types: Cigarettes   Quit date: 06/25/2022   Years since quitting: 0.3  Smokeless Tobacco Never    Goals Met:  Independence with exercise equipment Exercise tolerated well No report of concerns or symptoms today  Goals Unmet:  Not Applicable  Comments: Pt able to follow exercise prescription today without complaint.  Will continue to monitor for progression.    Dr. Emily Filbert is Medical Director for [place].  Dr. Ottie Glazier is Medical Director for [facility] Pulmonary Rehabilitation.

## 2022-10-20 ENCOUNTER — Encounter: Payer: 59 | Admitting: *Deleted

## 2022-10-20 DIAGNOSIS — I214 Non-ST elevation (NSTEMI) myocardial infarction: Secondary | ICD-10-CM | POA: Diagnosis not present

## 2022-10-20 NOTE — Progress Notes (Signed)
Daily Session Note  Patient Details  Name: Andrea [name] MRN: [id number] Date of Birth: 04-17-1971 Referring Provider:   Flowsheet Row Cardiac Rehab from 10/07/2022 in [facility] Cardiac and Pulmonary Rehab  Referring Provider Kathlyn Sacramento MD       Encounter Date: 10/20/2022  Check In:  Session Check In - 10/20/22 0809       Check-In   Supervising physician immediately available to respond to emergencies See telemetry face sheet for immediately available ER MD    Location [facility]-Cardiac & Pulmonary Rehab    Staff Present Heath Lark, RN, BSN, CCRP;Jessica [place], MA, RCEP, CCRP, CCET;Noah Tickle, BS, Exercise Physiologist    Virtual Visit No    Medication changes reported     No    Fall or balance concerns reported    No    Warm-up and Cool-down Performed on first and last piece of equipment    Resistance Training Performed Yes    VAD Patient? No    PAD/SET Patient? No      Pain Assessment   Currently in Pain? No/denies               Exercise Prescription Changes - 10/20/22 0700       Home Exercise Plan   Plans to continue exercise at [facility] (comment)   [employer]   Frequency Add 2 additional days to program exercise sessions.    Initial Home Exercises Provided 10/20/22             Social History   Tobacco Use  Smoking Status Former   Packs/day: 0.75   Years: 35.00   Total pack years: 26.25   Types: Cigarettes   Quit date: 06/25/2022   Years since quitting: 0.3  Smokeless Tobacco Never    Goals Met:  Independence with exercise equipment Exercise tolerated well No report of concerns or symptoms today  Goals Unmet:  Not Applicable  Comments: Pt able to follow exercise prescription today without complaint.  Will continue to monitor for progression.    Dr. Emily Filbert is Medical Director for [place].  Dr. Ottie Glazier is Medical Director for [facility] Pulmonary Rehabilitation.

## 2022-10-21 ENCOUNTER — Encounter: Payer: Self-pay | Admitting: *Deleted

## 2022-10-21 DIAGNOSIS — I214 Non-ST elevation (NSTEMI) myocardial infarction: Secondary | ICD-10-CM

## 2022-10-21 NOTE — Progress Notes (Signed)
Cardiac Individual Treatment Plan  Patient Details  Name: Andrea [name] MRN: [id number] Date of Birth: 12-29-1970 Referring Provider:   Flowsheet Row Cardiac Rehab from 10/07/2022 in [facility] Cardiac and Pulmonary Rehab  Referring Provider Kathlyn Sacramento MD       Initial Encounter Date:  Flowsheet Row Cardiac Rehab from 10/07/2022 in [facility] Cardiac and Pulmonary Rehab  Date 10/07/22       Visit Diagnosis: NSTEMI (non-ST elevated myocardial infarction) [facility])  Patient's Home Medications on Admission:  Current Outpatient Medications:    acetaminophen (TYLENOL) 325 MG tablet, Take 2 tablets (650 mg total) by mouth every 6 (six) hours as needed for mild pain (or temp > 100)., Disp:  , Rfl:    aspirin EC 81 MG tablet, Take 1 tablet (81 mg total) by mouth daily. Swallow whole., Disp: 30 tablet, Rfl: 12   atorvastatin (LIPITOR) 40 MG tablet, Take 1 tablet (40 mg total) by mouth every evening., Disp: 90 tablet, Rfl: 3   clopidogrel (PLAVIX) 75 MG tablet, Take 1 tablet (75 mg total) by mouth daily with breakfast., Disp: 90 tablet, Rfl: 3   diltiazem (CARDIZEM CD) 120 MG 24 hr capsule, Take 1 capsule (120 mg total) by mouth daily., Disp: 90 capsule, Rfl: 0   fenofibrate 160 MG tablet, Take 1 tablet (160 mg total) by mouth daily., Disp: 180 tablet, Rfl: 3   hydrocortisone (ANUSOL-HC) 2.5 % rectal cream, , Disp: , Rfl:    pantoprazole (PROTONIX) 40 MG tablet, Take 1 tablet (40 mg total) by mouth daily., Disp: 30 tablet, Rfl: 11  Past Medical History: Past Medical History:  Diagnosis Date   Abnormal Pap smear of cervix    2010-2014 abnormal pap every 3-6 months   dysplastic squamous cells of cervix    Anxiety    occ.- no meds   Blood in stool    Depression    no meds   Diverticulitis 08/2019   DUB (dysfunctional uterine bleeding)    Dyslipidemia    Dyspnea    Resolved - due to anemia - no current problems   Eczema    Endometriosis    Epigastric pain    Fatigue    Fibroid     GERD (gastroesophageal reflux disease)    Grade I diastolic dysfunction 47/06/6282   Noted on ECHO   Hemorrhoid    2nd and 3rd degree   History of adenomatous polyp of colon    History of palpitations    HSV infection    Hx of dysplastic nevus 07/17/2019   L upper back 4.0cm lateral to spine, moderate   Hyperlipidemia    diet controlled - no meds   Hypertension    not currently taking medication   Infertility, female    Iron deficiency anemia    Last iron infusion 09/14/2018   Left groin hernia    history of   Muscle cramping    with exertion   Obesity    Palpitations    Recurrent bacterial infection    Vaginal   Restless leg    due to anemia   Smoker    SVD (spontaneous vaginal delivery)    x 2   Thoracic back pain    while trying to sleep   Tinnitus, left    hears heart beat, MRI normal   Vertigo 2018    Tobacco Use: Social History   Tobacco Use  Smoking Status Former   Packs/day: 0.75   Years: 35.00   Total pack years: 26.25  Types: Cigarettes   Quit date: 06/25/2022   Years since quitting: 0.3  Smokeless Tobacco Never    Labs: Review Flowsheet       Latest Ref Rng & Units 05/29/2022 09/30/2022  Labs for ITP Cardiac and Pulmonary Rehab  Cholestrol 0 - 200 mg/dL 245  200   LDL (calc) 0 - 99 mg/dL 103  111   HDL-C >40 mg/dL 39  43   Trlycerides <150 mg/dL 602  229      Exercise Target Goals: Exercise Program Goal: Individual exercise prescription set using results from initial 6 min walk test and THRR while considering  patient's activity barriers and safety.   Exercise Prescription Goal: Initial exercise prescription builds to 30-45 minutes a day of aerobic activity, 2-3 days per week.  Home exercise guidelines will be given to patient during program as part of exercise prescription that the participant will acknowledge.   Education: Aerobic Exercise: - Group verbal and visual presentation on the components of exercise prescription. Introduces  F.I.T.T principle from ACSM for exercise prescriptions.  Reviews F.I.T.T. principles of aerobic exercise including progression. Written material given at graduation. Flowsheet Row Cardiac Rehab from 10/15/2022 in [facility] Cardiac and Pulmonary Rehab  Education need identified 10/07/22       Education: Resistance Exercise: - Group verbal and visual presentation on the components of exercise prescription. Introduces F.I.T.T principle from ACSM for exercise prescriptions  Reviews F.I.T.T. principles of resistance exercise including progression. Written material given at graduation.    Education: Exercise & Equipment Safety: - Individual verbal instruction and demonstration of equipment use and safety with use of the equipment. Flowsheet Row Cardiac Rehab from 10/15/2022 in [facility] Cardiac and Pulmonary Rehab  Education need identified 10/07/22  Date 10/07/22  Educator [place]  Instruction Review Code 1- Verbalizes Understanding       Education: Exercise Physiology & General Exercise Guidelines: - Group verbal and written instruction with models to review the exercise physiology of the cardiovascular system and associated critical values. Provides general exercise guidelines with specific guidelines to those with heart or lung disease.  Flowsheet Row Cardiac Rehab from 10/15/2022 in [facility] Cardiac and Pulmonary Rehab  Education need identified 10/07/22       Education: Flexibility, Balance, Mind/Body Relaxation: - Group verbal and visual presentation with interactive activity on the components of exercise prescription. Introduces F.I.T.T principle from ACSM for exercise prescriptions. Reviews F.I.T.T. principles of flexibility and balance exercise training including progression. Also discusses the mind body connection.  Reviews various relaxation techniques to help reduce and manage stress (i.e. Deep breathing, progressive muscle relaxation, and visualization). Balance handout provided to take home.  Written material given at graduation.   Activity Barriers & Risk Stratification:  Activity Barriers & Cardiac Risk Stratification - 10/06/22 1435       Activity Barriers & Cardiac Risk Stratification   Activity Barriers Back Problems   back pain but xray clear   Cardiac Risk Stratification High   still having chest pain            6 Minute Walk:  6 Minute Walk     Row Name 10/07/22 1711         6 Minute Walk   Phase Initial     Distance 1215 feet     Walk Time 6 minutes     # of Rest Breaks 0     MPH 2.3     METS 3.37     RPE 15     Perceived Dyspnea  3     VO2 Peak 11.81     Symptoms Yes (comment)     Comments Chest pain 6/10 - resolved with rest, SOB     Resting HR 65 bpm     Resting BP 122/72     Resting Oxygen Saturation  97 %     Exercise Oxygen Saturation  during 6 min walk 98 %     Max Ex. HR 111 bpm     Max Ex. BP 154/70     2 Minute Post BP 110/68              Oxygen Initial Assessment:   Oxygen Re-Evaluation:   Oxygen Discharge (Final Oxygen Re-Evaluation):   Initial Exercise Prescription:  Initial Exercise Prescription - 10/07/22 1700       Date of Initial Exercise RX and Referring Provider   Date 10/07/22    Referring Provider Kathlyn Sacramento MD      Treadmill   MPH 1.8    Grade 0    Minutes 15    METs 2.38      Recumbant Bike   Level 1    RPM 60    Watts 30    Minutes 15    METs 3.3      NuStep   Level 1    SPM 80    Minutes 15    METs 3.3      REL-XR   Level 1    Speed 50    Minutes 15    METs 3.3      T5 Nustep   Level 1    SPM 80    Minutes 15    METs 3.3      Prescription Details   Frequency (times per week) 3    Duration Progress to 30 minutes of continuous aerobic without signs/symptoms of physical distress      Intensity   THRR 40-80% of Max Heartrate 105 - 146    Ratings of Perceived Exertion 11-13    Perceived Dyspnea 0-4      Progression   Progression Continue to progress workloads to  maintain intensity without signs/symptoms of physical distress.      Resistance Training   Training Prescription Yes    Weight 5 lb    Reps 10-15             Perform Capillary Blood Glucose checks as needed.  Exercise Prescription Changes:   Exercise Prescription Changes     Row Name 10/07/22 1700 10/20/22 0700           Response to Exercise   Blood Pressure (Admit) 122/72 --      Blood Pressure (Exercise) 154/70 --      Blood Pressure (Exit) 110/68 --      Heart Rate (Admit) 65 bpm --      Heart Rate (Exercise) 111 bpm --      Heart Rate (Exit) 65 bpm --      Oxygen Saturation (Admit) 97 % --      Oxygen Saturation (Exercise) 98 % --      Rating of Perceived Exertion (Exercise) 15 --      Perceived Dyspnea (Exercise) 3 --      Symptoms chest pain 6/10 , resolved with rest; SOB --      Comments walk test results --        Home Exercise Plan   Plans to continue exercise at -- [employer] (comment)  [employer]  Frequency -- Add 2 additional days to program exercise sessions.      Initial Home Exercises Provided -- 10/20/22               Exercise Comments:   Exercise Comments     Row Name 10/12/22 0757           Exercise Comments First full day of exercise!  Patient was oriented to gym and equipment including functions, settings, policies, and procedures.  Patient's individual exercise prescription and treatment plan were reviewed.  All starting workloads were established based on the results of the 6 minute walk test done at initial orientation visit.  The plan for exercise progression was also introduced and progression will be customized based on patient's performance and goals.                Exercise Goals and Review:   Exercise Goals     Row Name 10/07/22 1717             Exercise Goals   Increase Physical Activity Yes       Intervention Develop an individualized exercise prescription for aerobic and resistive training based  on initial evaluation findings, risk stratification, comorbidities and participant's personal goals.;Provide advice, education, support and counseling about physical activity/exercise needs.       Expected Outcomes Short Term: Attend rehab on a regular basis to increase amount of physical activity.;Long Term: Add in home exercise to make exercise part of routine and to increase amount of physical activity.;Long Term: Exercising regularly at least 3-5 days a week.       Increase Strength and Stamina Yes       Intervention Provide advice, education, support and counseling about physical activity/exercise needs.;Develop an individualized exercise prescription for aerobic and resistive training based on initial evaluation findings, risk stratification, comorbidities and participant's personal goals.       Expected Outcomes Short Term: Increase workloads from initial exercise prescription for resistance, speed, and METs.;Short Term: Perform resistance training exercises routinely during rehab and add in resistance training at home;Long Term: Improve cardiorespiratory fitness, muscular endurance and strength as measured by increased METs and functional capacity (6MWT)       Able to understand and use rate of perceived exertion (RPE) scale Yes       Intervention Provide education and explanation on how to use RPE scale       Expected Outcomes Short Term: Able to use RPE daily in rehab to express subjective intensity level;Long Term:  Able to use RPE to guide intensity level when exercising independently       Able to understand and use Dyspnea scale Yes       Intervention Provide education and explanation on how to use Dyspnea scale       Expected Outcomes Short Term: Able to use Dyspnea scale daily in rehab to express subjective sense of shortness of breath during exertion;Long Term: Able to use Dyspnea scale to guide intensity level when exercising independently       Knowledge and understanding of Target  Heart Rate Range (THRR) Yes       Intervention Provide education and explanation of THRR including how the numbers were predicted and where they are located for reference       Expected Outcomes Short Term: Able to state/look up THRR;Long Term: Able to use THRR to govern intensity when exercising independently;Short Term: Able to use daily as guideline for intensity in rehab  Able to check pulse independently Yes       Intervention Provide education and demonstration on how to check pulse in carotid and radial arteries.;Review the importance of being able to check your own pulse for safety during independent exercise       Expected Outcomes Long Term: Able to check pulse independently and accurately;Short Term: Able to explain why pulse checking is important during independent exercise       Understanding of Exercise Prescription Yes       Intervention Provide education, explanation, and written materials on patient's individual exercise prescription       Expected Outcomes Short Term: Able to explain program exercise prescription;Long Term: Able to explain home exercise prescription to exercise independently                Exercise Goals Re-Evaluation :  Exercise Goals Re-Evaluation     Row Name 10/12/22 0757 10/20/22 0754           Exercise Goal Re-Evaluation   Exercise Goals Review Able to understand and use rate of perceived exertion (RPE) scale;Able to understand and use Dyspnea scale;Knowledge and understanding of Target Heart Rate Range (THRR);Understanding of Exercise Prescription Able to understand and use rate of perceived exertion (RPE) scale;Able to understand and use Dyspnea scale;Knowledge and understanding of Target Heart Rate Range (THRR);Understanding of Exercise Prescription;Able to check pulse independently;Increase Strength and Stamina;Increase Physical Activity      Comments Reviewed RPE scale, THR and program prescription with pt today.  Pt voiced understanding  and was given a copy of goals to take home. Reviewed home exercise with pt today.  Pt plans to walk and join her daughter at [facility] for exercise.  She also has equipment in her basement.  Reviewed THR, pulse, RPE, sign and symptoms, pulse oximetery and when to call 911 or MD.  Also discussed weather considerations and indoor options.  Pt voiced understanding.      Expected Outcomes Short: Use RPE daily to regulate intensity. Long: Follow program prescription in THR. Short: Start to go to gym with daughter Long: conitnue to improve stamina               Discharge Exercise Prescription (Final Exercise Prescription Changes):  Exercise Prescription Changes - 10/20/22 0700       Home Exercise Plan   Plans to continue exercise at [facility] (comment)   [employer]   Frequency Add 2 additional days to program exercise sessions.    Initial Home Exercises Provided 10/20/22             Nutrition:  Target Goals: Understanding of nutrition guidelines, daily intake of sodium '1500mg'$ , cholesterol '200mg'$ , calories 30% from fat and 7% or less from saturated fats, daily to have 5 or more servings of fruits and vegetables.  Education: All About Nutrition: -Group instruction provided by verbal, written material, interactive activities, discussions, models, and posters to present general guidelines for heart healthy nutrition including fat, fiber, MyPlate, the role of sodium in heart healthy nutrition, utilization of the nutrition label, and utilization of this knowledge for meal planning. Follow up email sent as well. Written material given at graduation. Flowsheet Row Cardiac Rehab from 10/15/2022 in [facility] Cardiac and Pulmonary Rehab  Education need identified 10/07/22       Biometrics:  Pre Biometrics - 10/07/22 1712       Pre Biometrics   Height 5' 8.3" (1.735 m)    Weight 250 lb 14.4 oz (113.8 kg)  Waist Circumference 54 inches    Hip Circumference 53.5 inches    Waist to Hip  Ratio 1.01 %    BMI (Calculated) 37.81    Single Leg Stand 30 seconds              Nutrition Therapy Plan and Nutrition Goals:  Nutrition Therapy & Goals - 10/07/22 1722       Nutrition Therapy   Diet Heart healthy, low Na    Protein (specify units) 90-100g    Fiber 25 grams    Whole Grain Foods 3 servings    Saturated Fats 12 max. grams    Fruits and Vegetables 8 servings/day    Sodium 2 grams      Personal Nutrition Goals   Nutrition Goal ST: review handouts. fill up a large cup when she makes her coffee and then set alerts to remind her to drink. Try hidden vegetable recipes. LT: include at least 5 fruit/vegetable servings/day, include whole grains at least 1x/day, drink water throughout the day    Comments 51 y.o. F admitted for cardiac rehab. PMHx includes HTN, HLD, anxiety/depression, diverticulitis, GERD. Reviewed relevant medications and recent lab work. [name] reports that her biggest goals are inlcuding more fruits/vegetables and drinking enough fluids (specifically water). She has hemorrhoids and reports that when she does eat higher fiber food like whole wheat bread and peanut butter, she will be constipated - discussed how increasing fiber without increasing water can cause/worsen constipation. Reviewed diverticulitis MNT - discussed that high fiber foods can help to prevent flare ups. [name] reports that she has to foce herself to drink fluids during the day, she likes water, but has a hard time drinking it; she has tried to keep her water close to her and use a straw, but they have not helped - suggested that she could fill up a large cup when she makes her coffee and then set alerts to remind her to drink. [name] reports that she does not like most vegetables and has a hard time including them - discussed hidden vegetables and search terms to find hidden vegetable recipes - provided handouts. Reviewed heart healthy eating.      Intervention Plan   Intervention Prescribe,  educate and counsel regarding individualized specific dietary modifications aiming towards targeted core components such as weight, hypertension, lipid management, diabetes, heart failure and other comorbidities.;Nutrition handout(s) given to patient.    Expected Outcomes Short Term Goal: Understand basic principles of dietary content, such as calories, fat, sodium, cholesterol and nutrients.;Short Term Goal: A plan has been developed with personal nutrition goals set during dietitian appointment.;Long Term Goal: Adherence to prescribed nutrition plan.             Nutrition Assessments:  MEDIFICTS Score Key: ?70 Need to make dietary changes  40-70 Heart Healthy Diet ? 40 Therapeutic Level Cholesterol Diet  Flowsheet Row Cardiac Rehab from 10/07/2022 in [facility] Cardiac and Pulmonary Rehab  Picture Your Plate Total Score on Admission 32      Picture Your Plate Scores: <74 Unhealthy dietary pattern with much room for improvement. 41-50 Dietary pattern unlikely to meet recommendations for good health and room for improvement. 51-60 More healthful dietary pattern, with some room for improvement.  >60 Healthy dietary pattern, although there may be some specific behaviors that could be improved.    Nutrition Goals Re-Evaluation:   Nutrition Goals Discharge (Final Nutrition Goals Re-Evaluation):   Psychosocial: Target Goals: Acknowledge presence or absence of significant depression and/or stress, maximize coping  skills, provide positive support system. Participant is able to verbalize types and ability to use techniques and skills needed for reducing stress and depression.   Education: Stress, Anxiety, and Depression - Group verbal and visual presentation to define topics covered.  Reviews how body is impacted by stress, anxiety, and depression.  Also discusses healthy ways to reduce stress and to treat/manage anxiety and depression.  Written material given at graduation.   Education:  Sleep Hygiene -Provides group verbal and written instruction about how sleep can affect your health.  Define sleep hygiene, discuss sleep cycles and impact of sleep habits. Review good sleep hygiene tips.    Initial Review & Psychosocial Screening:  Initial Psych Review & Screening - 10/06/22 1444       Initial Review   Current issues with Current Sleep Concerns;Current Stress Concerns    Source of Stress Concerns Unable to participate in former interests or hobbies;Unable to perform yard/household activities      [place]? Yes   family     Barriers   Psychosocial barriers to participate in program There are no identifiable barriers or psychosocial needs.;The patient should benefit from training in stress management and relaxation.      Screening Interventions   Interventions Encouraged to exercise;Provide feedback about the scores to participant;To provide support and resources with identified psychosocial needs    Expected Outcomes Short Term goal: Utilizing psychosocial counselor, staff and physician to assist with identification of specific Stressors or current issues interfering with healing process. Setting desired goal for each stressor or current issue identified.;Long Term Goal: Stressors or current issues are controlled or eliminated.;Short Term goal: Identification and review with participant of any Quality of Life or Depression concerns found by scoring the questionnaire.;Long Term goal: The participant improves quality of Life and PHQ9 Scores as seen by post scores and/or verbalization of changes             Quality of Life Scores:   Quality of Life - 10/07/22 1631       Quality of Life   Select Quality of Life      Quality of Life Scores   Health/Function Pre 12.33 %    Socioeconomic Pre 24.25 %    Psych/Spiritual Pre 14.71 %    Family Pre 22.8 %    GLOBAL Pre 17.03 %            Scores of 19 and below usually indicate a poorer  quality of life in these areas.  A difference of  2-3 points is a clinically meaningful difference.  A difference of 2-3 points in the total score of the Quality of Life Index has been associated with significant improvement in overall quality of life, self-image, physical symptoms, and general health in studies assessing change in quality of life.  PHQ-9: Review Flowsheet       10/07/2022  Depression screen PHQ 2/9  Decreased Interest 1  Down, Depressed, Hopeless 1  PHQ - 2 Score 2  Altered sleeping 1  Tired, decreased energy 3  Change in appetite 1  Feeling bad or failure about yourself  0  Trouble concentrating 0  Moving slowly or fidgety/restless 0  Suicidal thoughts 0  PHQ-9 Score 7  Difficult doing work/chores Somewhat difficult   Interpretation of Total Score  Total Score Depression Severity:  1-4 = Minimal depression, 5-9 = Mild depression, 10-14 = Moderate depression, 15-19 = Moderately severe depression, 20-27 = Severe depression   Psychosocial  Evaluation and Intervention:  Psychosocial Evaluation - 10/06/22 1456       Psychosocial Evaluation & Interventions   Interventions Encouraged to exercise with the program and follow exercise prescription;Relaxation education;Stress management education    Comments Andrea [name] is coming to 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 She is still having some chest pain, which her doctors are aware. She has new medication since the MI that she is adjusting to. Since her MI, she has been having headaches which she doesn't know if its medication related to due to possible anemia. She has a long GI history, including diverticulitis and hemorrhoids. When asked about stress, anxiety, and depression, she admits that she has been feeling all those things since her MI last week She is more teary and feels like she can't keep up. She knows its temporary, but stated it hurts because she can't follow her two year old granddaughter around like she wants. She is very  motivated to come to the program to start an exercise routine and to get help with nutrition. She is very proud that she quit smoking 06/25/22 cold [place] and has no desire to start back.    Expected Outcomes Short: attend cardiac rehab for education and exercise. Long: develop and maintain positive self care habits.    Continue Psychosocial Services  Follow up required by staff             Psychosocial Re-Evaluation:   Psychosocial Discharge (Final Psychosocial Re-Evaluation):   Vocational Rehabilitation: Provide vocational rehab assistance to qualifying candidates.   Vocational Rehab Evaluation & Intervention:  Vocational Rehab - 10/06/22 1438       Initial Vocational Rehab Evaluation & Intervention   Assessment shows need for Vocational Rehabilitation No             Education: Education Goals: Education classes will be provided on a variety of topics geared toward better understanding of heart health and risk factor modification. Participant will state understanding/return demonstration of topics presented as noted by education test scores.  Learning Barriers/Preferences:  Learning Barriers/Preferences - 10/06/22 1436       Learning Barriers/Preferences   Learning Barriers None    Learning Preferences None             General Cardiac Education Topics:  AED/CPR: - Group verbal and written instruction with the use of models to demonstrate the basic use of the AED with the basic ABC's of resuscitation.   Anatomy and Cardiac Procedures: - Group verbal and visual presentation and models provide information about basic cardiac anatomy and function. Reviews the testing methods done to diagnose heart disease and the outcomes of the test results. Describes the treatment choices: Medical Management, Angioplasty, or Coronary Bypass Surgery for treating various heart conditions including Myocardial Infarction, Angina, Valve Disease, and Cardiac Arrhythmias.  Written  material given at graduation.   Medication Safety: - Group verbal and visual instruction to review commonly prescribed medications for heart and lung disease. Reviews the medication, class of the drug, and side effects. Includes the steps to properly store meds and maintain the prescription regimen.  Written material given at graduation.   Intimacy: - Group verbal instruction through game format to discuss how heart and lung disease can affect sexual intimacy. Written material given at graduation..   Know Your Numbers and Heart Failure: - Group verbal and visual instruction to discuss disease risk factors for cardiac and pulmonary disease and treatment options.  Reviews associated critical values for Overweight/Obesity, Hypertension, Cholesterol, and Diabetes.  Discusses  basics of heart failure: signs/symptoms and treatments.  Introduces Heart Failure Zone chart for action plan for heart failure.  Written material given at graduation. Flowsheet Row Cardiac Rehab from 10/15/2022 in [facility] Cardiac and Pulmonary Rehab  Date 10/15/22  Educator [place]  Instruction Review Code 1- Verbalizes Understanding       Infection Prevention: - Provides verbal and written material to individual with discussion of infection control including proper hand washing and proper equipment cleaning during exercise session. Flowsheet Row Cardiac Rehab from 10/15/2022 in [facility] Cardiac and Pulmonary Rehab  Education need identified 10/07/22  Date 10/07/22  Educator [place]  Instruction Review Code 1- Verbalizes Understanding       Falls Prevention: - Provides verbal and written material to individual with discussion of falls prevention and safety. Flowsheet Row Cardiac Rehab from 10/15/2022 in [facility] Cardiac and Pulmonary Rehab  Education need identified 10/07/22  Date 10/07/22  Educator [place]  Instruction Review Code 1- Verbalizes Understanding       Other: -Provides group and verbal instruction on various topics  (see comments)   Knowledge Questionnaire Score:  Knowledge Questionnaire Score - 10/07/22 1631       Knowledge Questionnaire Score   Pre Score 25/26             Core Components/Risk Factors/Patient Goals at Admission:  Personal Goals and Risk Factors at Admission - 10/07/22 1717       Core Components/Risk Factors/Patient Goals on Admission    Weight Management Yes;Weight Loss;Obesity    Intervention Weight Management: Develop a combined nutrition and exercise program designed to reach desired caloric intake, while maintaining appropriate intake of nutrient and fiber, sodium and fats, and appropriate energy expenditure required for the weight goal.;Weight Management: Provide education and appropriate resources to help participant work on and attain dietary goals.;Weight Management/Obesity: Establish reasonable short term and long term weight goals.;Obesity: Provide education and appropriate resources to help participant work on and attain dietary goals.    Admit Weight 250 lb (113.4 kg)    Goal Weight: Short Term 245 lb (111.1 kg)    Goal Weight: Long Term 180 lb (81.6 kg)   Per patient   Expected Outcomes Short Term: Continue to assess and modify interventions until short term weight is achieved;Long Term: Adherence to nutrition and physical activity/exercise program aimed toward attainment of established weight goal;Weight Loss: Understanding of general recommendations for a balanced deficit meal plan, which promotes 1-2 lb weight loss per week and includes a negative energy balance of [phone] kcal/d;Understanding recommendations for meals to include 15-35% energy as protein, 25-35% energy from fat, 35-60% energy from carbohydrates, less than '200mg'$  of dietary cholesterol, 20-35 gm of total fiber daily;Understanding of distribution of calorie intake throughout the day with the consumption of 4-5 meals/snacks    Hypertension Yes    Intervention Monitor prescription use compliance.;Provide  education on lifestyle modifcations including regular physical activity/exercise, weight management, moderate sodium restriction and increased consumption of fresh fruit, vegetables, and low fat dairy, alcohol moderation, and smoking cessation.    Expected Outcomes Short Term: Continued assessment and intervention until BP is < 140/34m HG in hypertensive participants. < 130/831mHG in hypertensive participants with diabetes, heart failure or chronic kidney disease.;Long Term: Maintenance of blood pressure at goal levels.    Lipids Yes    Intervention Provide education and support for participant on nutrition & aerobic/resistive exercise along with prescribed medications to achieve LDL '70mg'$ , HDL >'40mg'$ .    Expected Outcomes Short Term: Participant states understanding of  desired cholesterol values and is compliant with medications prescribed. Participant is following exercise prescription and nutrition guidelines.;Long Term: Cholesterol controlled with medications as prescribed, with individualized exercise RX and with personalized nutrition plan. Value goals: LDL < '70mg'$ , HDL > 40 mg.             Education:Diabetes - Individual verbal and written instruction to review signs/symptoms of diabetes, desired ranges of glucose level fasting, after meals and with exercise. Acknowledge that pre and post exercise glucose checks will be done for 3 sessions at entry of program.   Core Components/Risk Factors/Patient Goals Review:    Core Components/Risk Factors/Patient Goals at Discharge (Final Review):    ITP Comments:  ITP Comments     Row Name 10/06/22 1502 10/07/22 1628 10/07/22 1718 10/07/22 1722 10/12/22 0756   ITP Comments Initial phone call completed. Diagnosis can be found in [facility] 12/6. EP Orientation scheduled for Wednesday 12/13 at 2:30. Completed 6MWT and gym orientation. Initial ITP created and sent for review to Dr. Emily Filbert, Medical Director. [name] has recently quit tobacco use within  the last 6 months. Her quite date was 06/26/22. Intervention for relapse prevention was provided at the initial medical review. He was encouraged to continue to with tobacco cessation and was provided information on relapse prevention. Patient received information about combination therapy, tobacco cessation classes, quit line, and quit smoking apps in case of a relapse. Patient demonstrated understanding of this material.Staff will continue to provide encouragement and follow up with the patient throughout the program. Patient was advised to follow up with Dr. Fletcher Anon first before returning to rehab. Per D/C notes, patient was supposed to have a hospital follow up. Patient is experiencing recurrent chest pain during activity. Small EKG changes noted today after 6MWT. Chest pain resolves at rest. EKG strips sent over to Dr. Fletcher Anon for review and will receive clearance for patient to return. Staff discussed with patient when to call 911. First full day of exercise!  Patient was oriented to gym and equipment including functions, settings, policies, and procedures.  Patient's individual exercise prescription and treatment plan were reviewed.  All starting workloads were established based on the results of the 6 minute walk test done at initial orientation visit.  The plan for exercise progression was also introduced and progression will be customized based on patient's performance and goals.    [place] Name 10/21/22 1007           ITP Comments 30 Day review completed. Medical Director ITP review done, changes made as directed, and signed approval by Medical Director.     new to program                Comments:

## 2022-10-22 ENCOUNTER — Encounter: Payer: 59 | Admitting: *Deleted

## 2022-10-22 DIAGNOSIS — I214 Non-ST elevation (NSTEMI) myocardial infarction: Secondary | ICD-10-CM | POA: Diagnosis not present

## 2022-10-22 NOTE — Progress Notes (Signed)
Daily Session Note  Patient Details  Name: Andrea [name] MRN: [id number] Date of Birth: [birth year] Referring Provider:   Flowsheet Row Cardiac Rehab from 10/07/2022 in [facility] Cardiac and Pulmonary Rehab  Referring Provider Kathlyn Sacramento MD       Encounter Date: 10/22/2022  Check In:  Session Check In - 10/22/22 0732       Check-In   Supervising physician immediately available to respond to emergencies See telemetry face sheet for immediately available ER MD    Location [facility]-Cardiac & Pulmonary Rehab    Staff Present Darlyne Russian, RN, ADN;Laureen Owens Shark, BS, RRT, CPFT;Kara [name] Bo, MS, ACSM CEP, Exercise Physiologist    Virtual Visit No    Medication changes reported     No    Fall or balance concerns reported    No    Tobacco Cessation No Change    Warm-up and Cool-down Performed on first and last piece of equipment    Resistance Training Performed Yes    VAD Patient? No    PAD/SET Patient? No      Pain Assessment   Currently in Pain? No/denies                Social History   Tobacco Use  Smoking Status Former   Packs/day: 0.75   Years: 35.00   Total pack years: 26.25   Types: Cigarettes   Quit date: 06/25/2022   Years since quitting: 0.3  Smokeless Tobacco Never    Goals Met:  Independence with exercise equipment Exercise tolerated well No report of concerns or symptoms today Strength training completed today  Goals Unmet:  Not Applicable  Comments: Pt able to follow exercise prescription today without complaint.  Will continue to monitor for progression.  Chest pain 2/10 with exercise today. Symptoms resolved with rest. Pt with chronic chest pain. Vitals stable. No further chest pain after rest. Pt completed rehab session without further complaints.   Dr. Emily Filbert is Medical Director for [place].  Dr. Ottie Glazier is Medical Director for [facility] Pulmonary Rehabilitation.

## 2022-10-27 ENCOUNTER — Encounter: Payer: 59 | Attending: Cardiovascular Disease | Admitting: *Deleted

## 2022-10-27 DIAGNOSIS — Z5189 Encounter for other specified aftercare: Secondary | ICD-10-CM | POA: Insufficient documentation

## 2022-10-27 DIAGNOSIS — I214 Non-ST elevation (NSTEMI) myocardial infarction: Secondary | ICD-10-CM | POA: Diagnosis present

## 2022-10-27 DIAGNOSIS — I252 Old myocardial infarction: Secondary | ICD-10-CM | POA: Diagnosis not present

## 2022-10-27 NOTE — Progress Notes (Signed)
Daily Session Note  Patient Details  Name: Andrea [name] MRN: [id number] Date of Birth: 04-24-1971 Referring Provider:   Flowsheet Row Cardiac Rehab from 10/07/2022 in [facility] Cardiac and Pulmonary Rehab  Referring Provider Kathlyn Sacramento MD       Encounter Date: 10/27/2022  Check In:  Session Check In - 10/27/22 0807       Check-In   Supervising physician immediately available to respond to emergencies See telemetry face sheet for immediately available ER MD    Location [facility]-Cardiac & Pulmonary Rehab    Staff Present Heath Lark, RN, BSN, CCRP;Jessica [place], MA, RCEP, CCRP, Bertram Gala, MS, ACSM CEP, Exercise Physiologist    Virtual Visit No    Medication changes reported     No    Fall or balance concerns reported    No    Warm-up and Cool-down Performed on first and last piece of equipment    Resistance Training Performed Yes    VAD Patient? No    PAD/SET Patient? No      Pain Assessment   Currently in Pain? No/denies                Social History   Tobacco Use  Smoking Status Former   Packs/day: 0.75   Years: 35.00   Total pack years: 26.25   Types: Cigarettes   Quit date: 06/25/2022   Years since quitting: 0.3  Smokeless Tobacco Never    Goals Met:  Independence with exercise equipment Exercise tolerated well No report of concerns or symptoms today  Goals Unmet:  Not Applicable  Comments: Pt able to follow exercise prescription today without complaint.  Will continue to monitor for progression.    Dr. Emily Filbert is Medical Director for [place].  Dr. Ottie Glazier is Medical Director for [facility] Pulmonary Rehabilitation.

## 2022-10-29 ENCOUNTER — Encounter: Payer: 59 | Admitting: *Deleted

## 2022-10-29 DIAGNOSIS — I252 Old myocardial infarction: Secondary | ICD-10-CM | POA: Diagnosis not present

## 2022-10-29 DIAGNOSIS — I214 Non-ST elevation (NSTEMI) myocardial infarction: Secondary | ICD-10-CM

## 2022-10-29 NOTE — Progress Notes (Signed)
Daily Session Note  Patient Details  Name: Andrea [name] MRN: [id number] Date of Birth: May 21, 1971 Referring Provider:   Flowsheet Row Cardiac Rehab from 10/07/2022 in [facility] Cardiac and Pulmonary Rehab  Referring Provider Kathlyn Sacramento MD       Encounter Date: 10/29/2022  Check In:  Session Check In - 10/29/22 0823       Check-In   Supervising physician immediately available to respond to emergencies See telemetry face sheet for immediately available ER MD    Location [facility]-Cardiac & Pulmonary Rehab    Staff Present Darlyne Russian, RN, ADN;Joseph Laurence Spates, MS, ACSM CEP, Exercise Physiologist    Virtual Visit No    Medication changes reported     No    Fall or balance concerns reported    No    Tobacco Cessation No Change    Current number of cigarettes/nicotine per day     0    Warm-up and Cool-down Performed on first and last piece of equipment    Resistance Training Performed Yes    VAD Patient? No    PAD/SET Patient? No      Pain Assessment   Currently in Pain? No/denies                Social History   Tobacco Use  Smoking Status Former   Packs/day: 0.75   Years: 35.00   Total pack years: 26.25   Types: Cigarettes   Quit date: 06/25/2022   Years since quitting: 0.3  Smokeless Tobacco Never    Goals Met:  Independence with exercise equipment Exercise tolerated well No report of concerns or symptoms today Strength training completed today  Goals Unmet:  Not Applicable  Comments: Pt able to follow exercise prescription today without complaint.  Will continue to monitor for progression.    Dr. Emily Filbert is Medical Director for [place].  Dr. Ottie Glazier is Medical Director for [facility] Pulmonary Rehabilitation.

## 2022-11-02 ENCOUNTER — Encounter: Payer: 59 | Admitting: *Deleted

## 2022-11-02 DIAGNOSIS — I252 Old myocardial infarction: Secondary | ICD-10-CM | POA: Diagnosis not present

## 2022-11-02 DIAGNOSIS — I214 Non-ST elevation (NSTEMI) myocardial infarction: Secondary | ICD-10-CM

## 2022-11-02 NOTE — Progress Notes (Signed)
Daily Session Note  Patient Details  Name: Andrea [name] MRN: [id number] Date of Birth: Apr 22, 1971 Referring Provider:   Flowsheet Row Cardiac Rehab from 10/07/2022 in [facility] Cardiac and Pulmonary Rehab  Referring Provider Kathlyn Sacramento MD       Encounter Date: 11/02/2022  Check In:  Session Check In - 11/02/22 0753       Check-In   Supervising physician immediately available to respond to emergencies See telemetry face sheet for immediately available ER MD    Location [facility]-Cardiac & Pulmonary Rehab    Staff Present Darlyne Russian, RN, ADN;Joseph [place], RCP,RRT,BSRT;Kelly [place], BS, ACSM CEP, Exercise Physiologist    Virtual Visit No    Medication changes reported     No    Fall or balance concerns reported    No    Tobacco Cessation No Change    Warm-up and Cool-down Performed on first and last piece of equipment    Resistance Training Performed Yes    VAD Patient? No    PAD/SET Patient? No      Pain Assessment   Currently in Pain? No/denies                Social History   Tobacco Use  Smoking Status Former   Packs/day: 0.75   Years: 35.00   Total pack years: 26.25   Types: Cigarettes   Quit date: 06/25/2022   Years since quitting: 0.3  Smokeless Tobacco Never    Goals Met:  Independence with exercise equipment Exercise tolerated well No report of concerns or symptoms today Strength training completed today  Goals Unmet:  Not Applicable  Comments: Pt able to follow exercise prescription today without complaint.  Will continue to monitor for progression.    Dr. Emily Filbert is Medical Director for [place].  Dr. Ottie Glazier is Medical Director for [facility] Pulmonary Rehabilitation.

## 2022-11-03 ENCOUNTER — Encounter: Payer: 59 | Admitting: *Deleted

## 2022-11-03 DIAGNOSIS — I214 Non-ST elevation (NSTEMI) myocardial infarction: Secondary | ICD-10-CM

## 2022-11-03 DIAGNOSIS — I252 Old myocardial infarction: Secondary | ICD-10-CM | POA: Diagnosis not present

## 2022-11-03 NOTE — Progress Notes (Signed)
Daily Session Note  Patient Details  Name: Andrea [name] MRN: [id number] Date of Birth: 02/22/1971 Referring Provider:   Flowsheet Row Cardiac Rehab from 10/07/2022 in [facility] Cardiac and Pulmonary Rehab  Referring Provider Kathlyn Sacramento MD       Encounter Date: 11/03/2022  Check In:  Session Check In - 11/03/22 0806       Check-In   Supervising physician immediately available to respond to emergencies See telemetry face sheet for immediately available ER MD    Location [facility]-Cardiac & Pulmonary Rehab    Staff Present Heath Lark, RN, BSN, CCRP;Jessica [place], MA, RCEP, CCRP, Bertram Gala, MS, ACSM CEP, Exercise Physiologist    Virtual Visit No    Medication changes reported     No    Fall or balance concerns reported    No    Warm-up and Cool-down Performed on first and last piece of equipment    VAD Patient? No    PAD/SET Patient? No      Pain Assessment   Currently in Pain? No/denies                Social History   Tobacco Use  Smoking Status Former   Packs/day: 0.75   Years: 35.00   Total pack years: 26.25   Types: Cigarettes   Quit date: 06/25/2022   Years since quitting: 0.3  Smokeless Tobacco Never    Goals Met:  Independence with exercise equipment Exercise tolerated well No report of concerns or symptoms today  Goals Unmet:  Not Applicable  Comments: Pt able to follow exercise prescription today without complaint.  Will continue to monitor for progression.    Dr. Emily Filbert is Medical Director for [place].  Dr. Ottie Glazier is Medical Director for [facility] Pulmonary Rehabilitation.

## 2022-11-05 ENCOUNTER — Encounter: Payer: 59 | Admitting: *Deleted

## 2022-11-05 DIAGNOSIS — I214 Non-ST elevation (NSTEMI) myocardial infarction: Secondary | ICD-10-CM

## 2022-11-05 DIAGNOSIS — I252 Old myocardial infarction: Secondary | ICD-10-CM | POA: Diagnosis not present

## 2022-11-05 NOTE — Progress Notes (Signed)
Daily Session Note  Patient Details  Name: Andrea [name] MRN: [id number] Date of Birth: [birth year] Referring Provider:   Flowsheet Row Cardiac Rehab from 10/07/2022 in [facility] Cardiac and Pulmonary Rehab  Referring Provider Kathlyn Sacramento MD       Encounter Date: 11/05/2022  Check In:  Session Check In - 11/05/22 0725       Check-In   Supervising physician immediately available to respond to emergencies See telemetry face sheet for immediately available ER MD    Location [facility]-Cardiac & Pulmonary Rehab    Staff Present Darlyne Russian, RN, Lorin Mercy, MS, ACSM CEP, Exercise Physiologist;Joseph Tessie Fass, [place]    Virtual Visit No    Medication changes reported     No    Fall or balance concerns reported    No    Tobacco Cessation No Change    Warm-up and Cool-down Performed on first and last piece of equipment    Resistance Training Performed Yes    VAD Patient? No    PAD/SET Patient? No      Pain Assessment   Currently in Pain? No/denies                Social History   Tobacco Use  Smoking Status Former   Packs/day: 0.75   Years: 35.00   Total pack years: 26.25   Types: Cigarettes   Quit date: 06/25/2022   Years since quitting: 0.3  Smokeless Tobacco Never    Goals Met:  Independence with exercise equipment Exercise tolerated well No report of concerns or symptoms today Strength training completed today  Goals Unmet:  Not Applicable  Comments: Pt able to follow exercise prescription today without complaint.  Will continue to monitor for progression.    Dr. Emily Filbert is Medical Director for [place].  Dr. Ottie Glazier is Medical Director for [facility] Pulmonary Rehabilitation.

## 2022-11-17 ENCOUNTER — Encounter: Payer: 59 | Admitting: *Deleted

## 2022-11-17 DIAGNOSIS — I214 Non-ST elevation (NSTEMI) myocardial infarction: Secondary | ICD-10-CM

## 2022-11-17 DIAGNOSIS — I252 Old myocardial infarction: Secondary | ICD-10-CM | POA: Diagnosis not present

## 2022-11-17 NOTE — Progress Notes (Signed)
Daily Session Note  Patient Details  Name: Andrea [name] MRN: [id number] Date of Birth: 06-22-1971 Referring Provider:   Flowsheet Row Cardiac Rehab from 10/07/2022 in [facility] Cardiac and Pulmonary Rehab  Referring Provider Kathlyn Sacramento MD       Encounter Date: 11/17/2022  Check In:  Session Check In - 11/17/22 0915       Check-In   Supervising physician immediately available to respond to emergencies See telemetry face sheet for immediately available ER MD    Location [facility]-Cardiac & Pulmonary Rehab    Staff Present Heath Lark, RN, BSN, CCRP;Laureen Owens Shark, BS, RRT, CPFT;Kara [name] Bo, MS, ACSM CEP, Exercise Physiologist    Virtual Visit No    Medication changes reported     No    Fall or balance concerns reported    No    Warm-up and Cool-down Performed on first and last piece of equipment    Resistance Training Performed Yes    VAD Patient? No    PAD/SET Patient? No      Pain Assessment   Currently in Pain? No/denies                Social History   Tobacco Use  Smoking Status Former   Packs/day: 0.75   Years: 35.00   Total pack years: 26.25   Types: Cigarettes   Quit date: 06/25/2022   Years since quitting: 0.3  Smokeless Tobacco Never    Goals Met:  Independence with exercise equipment Exercise tolerated well No report of concerns or symptoms today  Goals Unmet:  Not Applicable  Comments: Pt able to follow exercise prescription today without complaint.  Will continue to monitor for progression.    Dr. Emily Filbert is Medical Director for [place].  Dr. Ottie Glazier is Medical Director for [facility] Pulmonary Rehabilitation.

## 2022-11-18 ENCOUNTER — Encounter: Payer: Self-pay | Admitting: *Deleted

## 2022-11-18 DIAGNOSIS — I214 Non-ST elevation (NSTEMI) myocardial infarction: Secondary | ICD-10-CM

## 2022-11-18 NOTE — Progress Notes (Signed)
Cardiac Individual Treatment Plan  Patient Details  Name: Andrea [name] MRN: [id number] Date of Birth: 03/21/1971 Referring Provider:   Flowsheet Row Cardiac Rehab from 10/07/2022 in [facility] Cardiac and Pulmonary Rehab  Referring Provider Kathlyn Sacramento MD       Initial Encounter Date:  Flowsheet Row Cardiac Rehab from 10/07/2022 in [facility] Cardiac and Pulmonary Rehab  Date 10/07/22       Visit Diagnosis: NSTEMI (non-ST elevated myocardial infarction) [facility])  Patient's Home Medications on Admission:  Current Outpatient Medications:    acetaminophen (TYLENOL) 325 MG tablet, Take 2 tablets (650 mg total) by mouth every 6 (six) hours as needed for mild pain (or temp > 100)., Disp:  , Rfl:    aspirin EC 81 MG tablet, Take 1 tablet (81 mg total) by mouth daily. Swallow whole., Disp: 30 tablet, Rfl: 12   atorvastatin (LIPITOR) 40 MG tablet, Take 1 tablet (40 mg total) by mouth every evening., Disp: 90 tablet, Rfl: 3   clopidogrel (PLAVIX) 75 MG tablet, Take 1 tablet (75 mg total) by mouth daily with breakfast., Disp: 90 tablet, Rfl: 3   diltiazem (CARDIZEM CD) 120 MG 24 hr capsule, Take 1 capsule (120 mg total) by mouth daily., Disp: 90 capsule, Rfl: 0   fenofibrate 160 MG tablet, Take 1 tablet (160 mg total) by mouth daily., Disp: 180 tablet, Rfl: 3   hydrocortisone (ANUSOL-HC) 2.5 % rectal cream, , Disp: , Rfl:    pantoprazole (PROTONIX) 40 MG tablet, Take 1 tablet (40 mg total) by mouth daily., Disp: 30 tablet, Rfl: 11  Past Medical History: Past Medical History:  Diagnosis Date   Abnormal Pap smear of cervix    2010-2014 abnormal pap every 3-6 months   dysplastic squamous cells of cervix    Anxiety    occ.- no meds   Blood in stool    Depression    no meds   Diverticulitis 08/2019   DUB (dysfunctional uterine bleeding)    Dyslipidemia    Dyspnea    Resolved - due to anemia - no current problems   Eczema    Endometriosis    Epigastric pain    Fatigue    Fibroid     GERD (gastroesophageal reflux disease)    Grade I diastolic dysfunction 21/30/8657   Noted on ECHO   Hemorrhoid    2nd and 3rd degree   History of adenomatous polyp of colon    History of palpitations    HSV infection    Hx of dysplastic nevus 07/17/2019   L upper back 4.0cm lateral to spine, moderate   Hyperlipidemia    diet controlled - no meds   Hypertension    not currently taking medication   Infertility, female    Iron deficiency anemia    Last iron infusion 09/14/2018   Left groin hernia    history of   Muscle cramping    with exertion   Obesity    Palpitations    Recurrent bacterial infection    Vaginal   Restless leg    due to anemia   Smoker    SVD (spontaneous vaginal delivery)    x 2   Thoracic back pain    while trying to sleep   Tinnitus, left    hears heart beat, MRI normal   Vertigo 2018    Tobacco Use: Social History   Tobacco Use  Smoking Status Former   Packs/day: 0.75   Years: 35.00   Total pack years: 26.25  Types: Cigarettes   Quit date: 06/25/2022   Years since quitting: 0.4  Smokeless Tobacco Never    Labs: Review Flowsheet       Latest Ref Rng & Units 05/29/2022 09/30/2022  Labs for ITP Cardiac and Pulmonary Rehab  Cholestrol 0 - 200 mg/dL 245  200   LDL (calc) 0 - 99 mg/dL 103  111   HDL-C >40 mg/dL 39  43   Trlycerides <150 mg/dL 602  229      Exercise Target Goals: Exercise Program Goal: Individual exercise prescription set using results from initial 6 min walk test and THRR while considering  patient's activity barriers and safety.   Exercise Prescription Goal: Initial exercise prescription builds to 30-45 minutes a day of aerobic activity, 2-3 days per week.  Home exercise guidelines will be given to patient during program as part of exercise prescription that the participant will acknowledge.   Education: Aerobic Exercise: - Group verbal and visual presentation on the components of exercise prescription. Introduces  F.I.T.T principle from ACSM for exercise prescriptions.  Reviews F.I.T.T. principles of aerobic exercise including progression. Written material given at graduation. Flowsheet Row Cardiac Rehab from 11/05/2022 in [facility] Cardiac and Pulmonary Rehab  Education need identified 10/07/22       Education: Resistance Exercise: - Group verbal and visual presentation on the components of exercise prescription. Introduces F.I.T.T principle from ACSM for exercise prescriptions  Reviews F.I.T.T. principles of resistance exercise including progression. Written material given at graduation.    Education: Exercise & Equipment Safety: - Individual verbal instruction and demonstration of equipment use and safety with use of the equipment. Flowsheet Row Cardiac Rehab from 11/05/2022 in [facility] Cardiac and Pulmonary Rehab  Education need identified 10/07/22  Date 10/07/22  Educator [place]  Instruction Review Code 1- Verbalizes Understanding       Education: Exercise Physiology & General Exercise Guidelines: - Group verbal and written instruction with models to review the exercise physiology of the cardiovascular system and associated critical values. Provides general exercise guidelines with specific guidelines to those with heart or lung disease.  Flowsheet Row Cardiac Rehab from 11/05/2022 in [facility] Cardiac and Pulmonary Rehab  Education need identified 10/07/22  Date 11/05/22  Educator [facility]  Instruction Review Code 1- [employer] Understanding       Education: Flexibility, Balance, Mind/Body Relaxation: - Group verbal and visual presentation with interactive activity on the components of exercise prescription. Introduces F.I.T.T principle from ACSM for exercise prescriptions. Reviews F.I.T.T. principles of flexibility and balance exercise training including progression. Also discusses the mind body connection.  Reviews various relaxation techniques to help reduce and manage stress (i.e. Deep breathing, progressive  muscle relaxation, and visualization). Balance handout provided to take home. Written material given at graduation.   Activity Barriers & Risk Stratification:  Activity Barriers & Cardiac Risk Stratification - 10/06/22 1435       Activity Barriers & Cardiac Risk Stratification   Activity Barriers Back Problems   back pain but xray clear   Cardiac Risk Stratification High   still having chest pain            6 Minute Walk:  6 Minute Walk     Row Name 10/07/22 1711         6 Minute Walk   Phase Initial     Distance 1215 feet     Walk Time 6 minutes     # of Rest Breaks 0     MPH 2.3     METS  3.37     RPE 15     Perceived Dyspnea  3     VO2 Peak 11.81     Symptoms Yes (comment)     Comments Chest pain 6/10 - resolved with rest, SOB     Resting HR 65 bpm     Resting BP 122/72     Resting Oxygen Saturation  97 %     Exercise Oxygen Saturation  during 6 min walk 98 %     Max Ex. HR 111 bpm     Max Ex. BP 154/70     2 Minute Post BP 110/68              Oxygen Initial Assessment:   Oxygen Re-Evaluation:   Oxygen Discharge (Final Oxygen Re-Evaluation):   Initial Exercise Prescription:  Initial Exercise Prescription - 10/07/22 1700       Date of Initial Exercise RX and Referring Provider   Date 10/07/22    Referring Provider Kathlyn Sacramento MD      Treadmill   MPH 1.8    Grade 0    Minutes 15    METs 2.38      Recumbant Bike   Level 1    RPM 60    Watts 30    Minutes 15    METs 3.3      NuStep   Level 1    SPM 80    Minutes 15    METs 3.3      REL-XR   Level 1    Speed 50    Minutes 15    METs 3.3      T5 Nustep   Level 1    SPM 80    Minutes 15    METs 3.3      Prescription Details   Frequency (times per week) 3    Duration Progress to 30 minutes of continuous aerobic without signs/symptoms of physical distress      Intensity   THRR 40-80% of Max Heartrate 105 - 146    Ratings of Perceived Exertion 11-13    Perceived  Dyspnea 0-4      Progression   Progression Continue to progress workloads to maintain intensity without signs/symptoms of physical distress.      Resistance Training   Training Prescription Yes    Weight 5 lb    Reps 10-15             Perform Capillary Blood Glucose checks as needed.  Exercise Prescription Changes:   Exercise Prescription Changes     Row Name 10/07/22 1700 10/20/22 0700 10/22/22 1600 11/04/22 1600       Response to Exercise   Blood Pressure (Admit) 122/72 -- 134/72 124/72    Blood Pressure (Exercise) 154/70 -- 164/74 164/74    Blood Pressure (Exit) 110/68 -- 148/70 122/62    Heart Rate (Admit) 65 bpm -- 96 bpm 87 bpm    Heart Rate (Exercise) 111 bpm -- 136 bpm 144 bpm    Heart Rate (Exit) 65 bpm -- 92 bpm 87 bpm    Oxygen Saturation (Admit) 97 % -- -- --    Oxygen Saturation (Exercise) 98 % -- -- --    Rating of Perceived Exertion (Exercise) 15 -- 15 15    Perceived Dyspnea (Exercise) 3 -- -- --    Symptoms chest pain 6/10 , resolved with rest; SOB -- 2/10 Chest pain, resolved with rest none    Comments walk test results --  4th full day of exercise --    Duration -- -- Continue with 30 min of aerobic exercise without signs/symptoms of physical distress. Continue with 30 min of aerobic exercise without signs/symptoms of physical distress.    Intensity -- -- THRR unchanged THRR unchanged      Progression   Progression -- -- Continue to progress workloads to maintain intensity without signs/symptoms of physical distress. Continue to progress workloads to maintain intensity without signs/symptoms of physical distress.    Average METs -- -- 2.75 3.39      Resistance Training   Training Prescription -- -- Yes Yes    Weight -- -- 4 lb 5 lb    Reps -- -- 10-15 10-15      Interval Training   Interval Training -- -- No No      Treadmill   MPH -- -- 2 2.2    Grade -- -- 1.5 2    Minutes -- -- 15 15    METs -- -- 2.95 3.29      Recumbant Bike   Level  -- -- -- 4    Watts -- -- -- 36    Minutes -- -- -- 15    METs -- -- -- 3      NuStep   Level -- -- 3 6    Minutes -- -- 15 15    METs -- -- 4 5.1      T5 Nustep   Level -- -- 4 6    Minutes -- -- 15 15    METs -- -- 2.1 2.5      Biostep-RELP   Level -- -- 2 --    Minutes -- -- 15 --    METs -- -- 2 --      Home Exercise Plan   Plans to continue exercise at -- [employer] (comment)  [employer] (comment)  [employer] seism[employer] (comment)  [employer]    Frequency -- Add 2 additional days to program exercise sessions. Add 2 additional days to program exercise sessions. Add 2 additional days to program exercise sessions.    Initial Home Exercises Provided -- 10/20/22 10/20/22 10/20/22      Oxygen   Maintain Oxygen Saturation -- -- 88% or higher 88% or higher             Exercise Comments:   Exercise Comments     Row Name 10/12/22 0757           Exercise Comments First full day of exercise!  Patient was oriented to gym and equipment including functions, settings, policies, and procedures.  Patient's individual exercise prescription and treatment plan were reviewed.  All starting workloads were established based on the results of the 6 minute walk test done at initial orientation visit.  The plan for exercise progression was also introduced and progression will be customized based on patient's performance and goals.                Exercise Goals and Review:   Exercise Goals     Row Name 10/07/22 1717             Exercise Goals   Increase Physical Activity Yes       Intervention Develop an individualized exercise prescription for aerobic and resistive training based on initial evaluation findings, risk stratification, comorbidities and participant's personal goals.;Provide advice, education, support and counseling about physical activity/exercise needs.       Expected Outcomes  Short Term: Attend rehab on a regular basis to  increase amount of physical activity.;Long Term: Add in home exercise to make exercise part of routine and to increase amount of physical activity.;Long Term: Exercising regularly at least 3-5 days a week.       Increase Strength and Stamina Yes       Intervention Provide advice, education, support and counseling about physical activity/exercise needs.;Develop an individualized exercise prescription for aerobic and resistive training based on initial evaluation findings, risk stratification, comorbidities and participant's personal goals.       Expected Outcomes Short Term: Increase workloads from initial exercise prescription for resistance, speed, and METs.;Short Term: Perform resistance training exercises routinely during rehab and add in resistance training at home;Long Term: Improve cardiorespiratory fitness, muscular endurance and strength as measured by increased METs and functional capacity (6MWT)       Able to understand and use rate of perceived exertion (RPE) scale Yes       Intervention Provide education and explanation on how to use RPE scale       Expected Outcomes Short Term: Able to use RPE daily in rehab to express subjective intensity level;Long Term:  Able to use RPE to guide intensity level when exercising independently       Able to understand and use Dyspnea scale Yes       Intervention Provide education and explanation on how to use Dyspnea scale       Expected Outcomes Short Term: Able to use Dyspnea scale daily in rehab to express subjective sense of shortness of breath during exertion;Long Term: Able to use Dyspnea scale to guide intensity level when exercising independently       Knowledge and understanding of Target Heart Rate Range (THRR) Yes       Intervention Provide education and explanation of THRR including how the numbers were predicted and where they are located for reference       Expected Outcomes Short Term: Able to state/look up THRR;Long Term: Able to use THRR to  govern intensity when exercising independently;Short Term: Able to use daily as guideline for intensity in rehab       Able to check pulse independently Yes       Intervention Provide education and demonstration on how to check pulse in carotid and radial arteries.;Review the importance of being able to check your own pulse for safety during independent exercise       Expected Outcomes Long Term: Able to check pulse independently and accurately;Short Term: Able to explain why pulse checking is important during independent exercise       Understanding of Exercise Prescription Yes       Intervention Provide education, explanation, and written materials on patient's individual exercise prescription       Expected Outcomes Short Term: Able to explain program exercise prescription;Long Term: Able to explain home exercise prescription to exercise independently                Exercise Goals Re-Evaluation :  Exercise Goals Re-Evaluation     Row Name 10/12/22 0757 10/20/22 0754 10/22/22 1650 10/29/22 0823 11/04/22 1624     Exercise Goal Re-Evaluation   Exercise Goals Review Able to understand and use rate of perceived exertion (RPE) scale;Able to understand and use Dyspnea scale;Knowledge and understanding of Target Heart Rate Range (THRR);Understanding of Exercise Prescription Able to understand and use rate of perceived exertion (RPE) scale;Able to understand and use Dyspnea scale;Knowledge and understanding of Target Heart Rate  Range (THRR);Understanding of Exercise Prescription;Able to check pulse independently;Increase Strength and Stamina;Increase Physical Activity Increase Physical Activity;Increase Strength and Stamina;Understanding of Exercise Prescription Increase Physical Activity;Increase Strength and Stamina;Understanding of Exercise Prescription Increase Physical Activity;Increase Strength and Stamina;Understanding of Exercise Prescription   Comments Reviewed RPE scale, THR and program  prescription with pt today.  Pt voiced understanding and was given a copy of goals to take home. Reviewed home exercise with pt today.  Pt plans to walk and join her daughter at [facility] for exercise.  She also has equipment in her basement.  Reviewed THR, pulse, RPE, sign and symptoms, pulse oximetery and when to call 911 or MD.  Also discussed weather considerations and indoor options.  Pt voiced understanding. Janace Hoard is doing well for the first couple of weeks she has been here. She has increased all her workloads so far, specifically up to level 3 and 4 on both the T4 and T5 Nustep, respectively. She has also increased to 2.0 mph/ 1.5% incline  on the treadmill. She still has had some mild chest pain, typically 2/10 and resolves easily with rest. Her doctors are still aware and working on medication changes. We will continue to monitor. Patient plans to walk at home and attend the gym for her exercise. Reviewed THR and encouraged to watch her HR when exercising. Also encouraged to incorporate some type of resistance training while at the gym, either using free weight or weight training. Patient plans to continue to exercise outside of rehab to equate 3-5 days total/week. Janace Hoard is doing well in rehab. She recently increased her overall MET level to 3.39 METs. She also improved her workloads on her seated machines, up to level 6 on the T4 and T5, and level 4 on the recumbent bike. She also increased her workload on the treadmill to a speed of 2.2 mph and an incline of 2%. We will continue to monitor her progress in the program.   Expected Outcomes Short: Use RPE daily to regulate intensity. Long: Follow program prescription in THR. Short: Start to go to gym with daughter Long: conitnue to improve stamina Short: Continue to increase workloads in relevance to chest pain symptoms Long: Continue to increase overall MET level ShorT: Start checking HR during exercise Long: Continue to execise independently at home  Short: Continue to increase workloads. Long: Continue to improve strength and stamina.            Discharge Exercise Prescription (Final Exercise Prescription Changes):  Exercise Prescription Changes - 11/04/22 1600       Response to Exercise   Blood Pressure (Admit) 124/72    Blood Pressure (Exercise) 164/74    Blood Pressure (Exit) 122/62    Heart Rate (Admit) 87 bpm    Heart Rate (Exercise) 144 bpm    Heart Rate (Exit) 87 bpm    Rating of Perceived Exertion (Exercise) 15    Symptoms none    Duration Continue with 30 min of aerobic exercise without signs/symptoms of physical distress.    Intensity THRR unchanged      Progression   Progression Continue to progress workloads to maintain intensity without signs/symptoms of physical distress.    Average METs 3.39      Resistance Training   Training Prescription Yes    Weight 5 lb    Reps 10-15      Interval Training   Interval Training No      Treadmill   MPH 2.2    Grade 2  Minutes 15    METs 3.29      Recumbant Bike   Level 4    Watts 36    Minutes 15    METs 3      NuStep   Level 6    Minutes 15    METs 5.1      T5 Nustep   Level 6    Minutes 15    METs 2.5      Home Exercise Plan   Plans to continue exercise at [employer] (comment)   [employer]   Frequency Add 2 additional days to program exercise sessions.    Initial Home Exercises Provided 10/20/22      Oxygen   Maintain Oxygen Saturation 88% or higher             Nutrition:  Target Goals: Understanding of nutrition guidelines, daily intake of sodium '1500mg'$ , cholesterol '200mg'$ , calories 30% from fat and 7% or less from saturated fats, daily to have 5 or more servings of fruits and vegetables.  Education: All About Nutrition: -Group instruction provided by verbal, written material, interactive activities, discussions, models, and posters to present general guidelines for heart healthy nutrition including fat, fiber, MyPlate,  the role of sodium in heart healthy nutrition, utilization of the nutrition label, and utilization of this knowledge for meal planning. Follow up email sent as well. Written material given at graduation. Flowsheet Row Cardiac Rehab from 11/05/2022 in [facility] Cardiac and Pulmonary Rehab  Education need identified 10/07/22       Biometrics:  Pre Biometrics - 10/07/22 1712       Pre Biometrics   Height 5' 8.3" (1.735 m)    Weight 250 lb 14.4 oz (113.8 kg)    Waist Circumference 54 inches    Hip Circumference 53.5 inches    Waist to Hip Ratio 1.01 %    BMI (Calculated) 37.81    Single Leg Stand 30 seconds              Nutrition Therapy Plan and Nutrition Goals:  Nutrition Therapy & Goals - 10/07/22 1722       Nutrition Therapy   Diet Heart healthy, low Na    Protein (specify units) 90-100g    Fiber 25 grams    Whole Grain Foods 3 servings    Saturated Fats 12 max. grams    Fruits and Vegetables 8 servings/day    Sodium 2 grams      Personal Nutrition Goals   Nutrition Goal ST: review handouts. fill up a large cup when she makes her coffee and then set alerts to remind her to drink. Try hidden vegetable recipes. LT: include at least 5 fruit/vegetable servings/day, include whole grains at least 1x/day, drink water throughout the day    Comments 52 y.o. F admitted for cardiac rehab. PMHx includes HTN, HLD, anxiety/depression, diverticulitis, GERD. Reviewed relevant medications and recent lab work. Andrea [name] reports that her biggest goals are inlcuding more fruits/vegetables and drinking enough fluids (specifically water). She has hemorrhoids and reports that when she does eat higher fiber food like whole wheat bread and peanut butter, she will be constipated - discussed how increasing fiber without increasing water can cause/worsen constipation. Reviewed diverticulitis MNT - discussed that high fiber foods can help to prevent flare ups. Andrea [name] reports that she has to foce herself to drink  fluids during the day, she likes water, but has a hard time drinking it; she has tried to keep her water close to her  and use a straw, but they have not helped - suggested that she could fill up a large cup when she makes her coffee and then set alerts to remind her to drink. Andrea [name] reports that she does not like most vegetables and has a hard time including them - discussed hidden vegetables and search terms to find hidden vegetable recipes - provided handouts. Reviewed heart healthy eating.      Intervention Plan   Intervention Prescribe, educate and counsel regarding individualized specific dietary modifications aiming towards targeted core components such as weight, hypertension, lipid management, diabetes, heart failure and other comorbidities.;Nutrition handout(s) given to patient.    Expected Outcomes Short Term Goal: Understand basic principles of dietary content, such as calories, fat, sodium, cholesterol and nutrients.;Short Term Goal: A plan has been developed with personal nutrition goals set during dietitian appointment.;Long Term Goal: Adherence to prescribed nutrition plan.             Nutrition Assessments:  MEDIFICTS Score Key: ?70 Need to make dietary changes  40-70 Heart Healthy Diet ? 40 Therapeutic Level Cholesterol Diet  Flowsheet Row Cardiac Rehab from 10/07/2022 in [facility] Cardiac and Pulmonary Rehab  Picture Your Plate Total Score on Admission 32      Picture Your Plate Scores: <58 Unhealthy dietary pattern with much room for improvement. 41-50 Dietary pattern unlikely to meet recommendations for good health and room for improvement. 51-60 More healthful dietary pattern, with some room for improvement.  >60 Healthy dietary pattern, although there may be some specific behaviors that could be improved.    Nutrition Goals Re-Evaluation:  Nutrition Goals Re-Evaluation     [place] Name 10/29/22 0729             Goals   Nutrition Goal ST: review handouts. fill up a  large cup when she makes her coffee and then set alerts to remind her to drink. Try hidden vegetable recipes. LT: include at least 5 fruit/vegetable servings/day, include whole grains at least 1x/day, drink water throughout the day       Comment Andrea [name] admits she has not made many changes regarding her diet just yet but is motivated to make this her next change. She really struggles with her intake of water and may get a water bottle to mark it with amount of ozs and keep track throughout the day and make hourly goals. She really wants to start meal prepping with her husband for portion control. She also is going to try to work on reducing her sodium intake as she knows she eats too much but hasn't looked at the labels to see how much.       Expected Outcome Short: Work on RD recommendations, focus on drinking more water Long: Continue to eat heart healthy exercise                Nutrition Goals Discharge (Final Nutrition Goals Re-Evaluation):  Nutrition Goals Re-Evaluation - 10/29/22 0729       Goals   Nutrition Goal ST: review handouts. fill up a large cup when she makes her coffee and then set alerts to remind her to drink. Try hidden vegetable recipes. LT: include at least 5 fruit/vegetable servings/day, include whole grains at least 1x/day, drink water throughout the day    Comment Andrea [name] admits she has not made many changes regarding her diet just yet but is motivated to make this her next change. She really struggles with her intake of water and may get a water bottle to  mark it with amount of ozs and keep track throughout the day and make hourly goals. She really wants to start meal prepping with her husband for portion control. She also is going to try to work on reducing her sodium intake as she knows she eats too much but hasn't looked at the labels to see how much.    Expected Outcome Short: Work on RD recommendations, focus on drinking more water Long: Continue to eat heart healthy  exercise             Psychosocial: Target Goals: Acknowledge presence or absence of significant depression and/or stress, maximize coping skills, provide positive support system. Participant is able to verbalize types and ability to use techniques and skills needed for reducing stress and depression.   Education: Stress, Anxiety, and Depression - Group verbal and visual presentation to define topics covered.  Reviews how body is impacted by stress, anxiety, and depression.  Also discusses healthy ways to reduce stress and to treat/manage anxiety and depression.  Written material given at graduation. Flowsheet Row Cardiac Rehab from 11/05/2022 in [facility] Cardiac and Pulmonary Rehab  Date 10/29/22  Educator [facility]  Instruction Review Code 1- [employer] Understanding       Education: Sleep Hygiene -Provides group verbal and written instruction about how sleep can affect your health.  Define sleep hygiene, discuss sleep cycles and impact of sleep habits. Review good sleep hygiene tips.    Initial Review & Psychosocial Screening:  Initial Psych Review & Screening - 10/06/22 1444       Initial Review   Current issues with Current Sleep Concerns;Current Stress Concerns    Source of Stress Concerns Unable to participate in former interests or hobbies;Unable to perform yard/household activities      [place]? Yes   family     Barriers   Psychosocial barriers to participate in program There are no identifiable barriers or psychosocial needs.;The patient should benefit from training in stress management and relaxation.      Screening Interventions   Interventions Encouraged to exercise;Provide feedback about the scores to participant;To provide support and resources with identified psychosocial needs    Expected Outcomes Short Term goal: Utilizing psychosocial counselor, staff and physician to assist with identification of specific Stressors or current issues  interfering with healing process. Setting desired goal for each stressor or current issue identified.;Long Term Goal: Stressors or current issues are controlled or eliminated.;Short Term goal: Identification and review with participant of any Quality of Life or Depression concerns found by scoring the questionnaire.;Long Term goal: The participant improves quality of Life and PHQ9 Scores as seen by post scores and/or verbalization of changes             Quality of Life Scores:   Quality of Life - 10/07/22 1631       Quality of Life   Select Quality of Life      Quality of Life Scores   Health/Function Pre 12.33 %    Socioeconomic Pre 24.25 %    Psych/Spiritual Pre 14.71 %    Family Pre 22.8 %    GLOBAL Pre 17.03 %            Scores of 19 and below usually indicate a poorer quality of life in these areas.  A difference of  2-3 points is a clinically meaningful difference.  A difference of 2-3 points in the total score of the Quality of Life Index has been associated  with significant improvement in overall quality of life, self-image, physical symptoms, and general health in studies assessing change in quality of life.  PHQ-9: Review Flowsheet       10/07/2022  Depression screen PHQ 2/9  Decreased Interest 1  Down, Depressed, Hopeless 1  PHQ - 2 Score 2  Altered sleeping 1  Tired, decreased energy 3  Change in appetite 1  Feeling bad or failure about yourself  0  Trouble concentrating 0  Moving slowly or fidgety/restless 0  Suicidal thoughts 0  PHQ-9 Score 7  Difficult doing work/chores Somewhat difficult   Interpretation of Total Score  Total Score Depression Severity:  1-4 = Minimal depression, 5-9 = Mild depression, 10-14 = Moderate depression, 15-19 = Moderately severe depression, 20-27 = Severe depression   Psychosocial Evaluation and Intervention:  Psychosocial Evaluation - 10/06/22 1456       Psychosocial Evaluation & Interventions   Interventions  Encouraged to exercise with the program and follow exercise prescription;Relaxation education;Stress management education    Comments Janace Hoard is coming to 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 She is still having some chest pain, which her doctors are aware. She has new medication since the MI that she is adjusting to. Since her MI, she has been having headaches which she doesn't know if its medication related to due to possible anemia. She has a long GI history, including diverticulitis and hemorrhoids. When asked about stress, anxiety, and depression, she admits that she has been feeling all those things since her MI last week She is more teary and feels like she can't keep up. She knows its temporary, but stated it hurts because she can't follow her two year old granddaughter around like she wants. She is very motivated to come to the program to start an exercise routine and to get help with nutrition. She is very proud that she quit smoking 06/25/22 cold [place] and has no desire to start back.    Expected Outcomes Short: attend cardiac rehab for education and exercise. Long: develop and maintain positive self care habits.    Continue Psychosocial Services  Follow up required by staff             Psychosocial Re-Evaluation:  Psychosocial Re-Evaluation     [place] Name 10/29/22 [phone]             Psychosocial Re-Evaluation   Current issues with Current Stress Concerns;Current Sleep Concerns       Comments Andrea [name] states she is doing well mentally. She definitely experiences some stress through work but states she "thrives" on stress at work as it keeps her motivated. She did state that she feels she manages it well and not to the point where it is unbearable. She states she is not sleeping good, as she wakes up a lot during the night but can fall asleep quick. She hasn't contacted her doctor yet but admits she is on her phone a lot before bed. She was encouraged to contact her PCP to see if there is anything  she is able to take and to also ask about a possible sleep study if warranted. She is enjoying the program so far but is frustrated she is still dealing with mild chest pain and hasn't had answers yet from a doctor as to what is causing it. They are still in the process of changing her medications.       Expected Outcomes Short: Ask doctor about any possible sleep aids Long: Continue to maintain positive attitude  Interventions Encouraged to attend Cardiac Rehabilitation for the exercise       Continue Psychosocial Services  Follow up required by staff                Psychosocial Discharge (Final Psychosocial Re-Evaluation):  Psychosocial Re-Evaluation - 10/29/22 0735       Psychosocial Re-Evaluation   Current issues with Current Stress Concerns;Current Sleep Concerns    Comments Andrea [name] states she is doing well mentally. She definitely experiences some stress through work but states she "thrives" on stress at work as it keeps her motivated. She did state that she feels she manages it well and not to the point where it is unbearable. She states she is not sleeping good, as she wakes up a lot during the night but can fall asleep quick. She hasn't contacted her doctor yet but admits she is on her phone a lot before bed. She was encouraged to contact her PCP to see if there is anything she is able to take and to also ask about a possible sleep study if warranted. She is enjoying the program so far but is frustrated she is still dealing with mild chest pain and hasn't had answers yet from a doctor as to what is causing it. They are still in the process of changing her medications.    Expected Outcomes Short: Ask doctor about any possible sleep aids Long: Continue to maintain positive attitude    Interventions Encouraged to attend Cardiac Rehabilitation for the exercise    Continue Psychosocial Services  Follow up required by staff             Vocational Rehabilitation: Provide vocational  rehab assistance to qualifying candidates.   Vocational Rehab Evaluation & Intervention:  Vocational Rehab - 10/06/22 1438       Initial Vocational Rehab Evaluation & Intervention   Assessment shows need for Vocational Rehabilitation No             Education: Education Goals: Education classes will be provided on a variety of topics geared toward better understanding of heart health and risk factor modification. Participant will state understanding/return demonstration of topics presented as noted by education test scores.  Learning Barriers/Preferences:  Learning Barriers/Preferences - 10/06/22 1436       Learning Barriers/Preferences   Learning Barriers None    Learning Preferences None             General Cardiac Education Topics:  AED/CPR: - Group verbal and written instruction with the use of models to demonstrate the basic use of the AED with the basic ABC's of resuscitation.   Anatomy and Cardiac Procedures: - Group verbal and visual presentation and models provide information about basic cardiac anatomy and function. Reviews the testing methods done to diagnose heart disease and the outcomes of the test results. Describes the treatment choices: Medical Management, Angioplasty, or Coronary Bypass Surgery for treating various heart conditions including Myocardial Infarction, Angina, Valve Disease, and Cardiac Arrhythmias.  Written material given at graduation.   Medication Safety: - Group verbal and visual instruction to review commonly prescribed medications for heart and lung disease. Reviews the medication, class of the drug, and side effects. Includes the steps to properly store meds and maintain the prescription regimen.  Written material given at graduation.   Intimacy: - Group verbal instruction through game format to discuss how heart and lung disease can affect sexual intimacy. Written material given at graduation..   Know Your Numbers and Heart  Failure: -  Group verbal and visual instruction to discuss disease risk factors for cardiac and pulmonary disease and treatment options.  Reviews associated critical values for Overweight/Obesity, Hypertension, Cholesterol, and Diabetes.  Discusses basics of heart failure: signs/symptoms and treatments.  Introduces Heart Failure Zone chart for action plan for heart failure.  Written material given at graduation. Flowsheet Row Cardiac Rehab from 11/05/2022 in [facility] Cardiac and Pulmonary Rehab  Date 10/15/22  Educator [place]  Instruction Review Code 1- Verbalizes Understanding       Infection Prevention: - Provides verbal and written material to individual with discussion of infection control including proper hand washing and proper equipment cleaning during exercise session. Flowsheet Row Cardiac Rehab from 11/05/2022 in [facility] Cardiac and Pulmonary Rehab  Education need identified 10/07/22  Date 10/07/22  Educator [place]  Instruction Review Code 1- Verbalizes Understanding       Falls Prevention: - Provides verbal and written material to individual with discussion of falls prevention and safety. Flowsheet Row Cardiac Rehab from 11/05/2022 in [facility] Cardiac and Pulmonary Rehab  Education need identified 10/07/22  Date 10/07/22  Educator [place]  Instruction Review Code 1- Verbalizes Understanding       Other: -Provides group and verbal instruction on various topics (see comments)   Knowledge Questionnaire Score:  Knowledge Questionnaire Score - 10/07/22 1631       Knowledge Questionnaire Score   Pre Score 25/26             Core Components/Risk Factors/Patient Goals at Admission:  Personal Goals and Risk Factors at Admission - 10/07/22 1717       Core Components/Risk Factors/Patient Goals on Admission    Weight Management Yes;Weight Loss;Obesity    Intervention Weight Management: Develop a combined nutrition and exercise program designed to reach desired caloric intake, while  maintaining appropriate intake of nutrient and fiber, sodium and fats, and appropriate energy expenditure required for the weight goal.;Weight Management: Provide education and appropriate resources to help participant work on and attain dietary goals.;Weight Management/Obesity: Establish reasonable short term and long term weight goals.;Obesity: Provide education and appropriate resources to help participant work on and attain dietary goals.    Admit Weight 250 lb (113.4 kg)    Goal Weight: Short Term 245 lb (111.1 kg)    Goal Weight: Long Term 180 lb (81.6 kg)   Per patient   Expected Outcomes Short Term: Continue to assess and modify interventions until short term weight is achieved;Long Term: Adherence to nutrition and physical activity/exercise program aimed toward attainment of established weight goal;Weight Loss: Understanding of general recommendations for a balanced deficit meal plan, which promotes 1-2 lb weight loss per week and includes a negative energy balance of [phone] kcal/d;Understanding recommendations for meals to include 15-35% energy as protein, 25-35% energy from fat, 35-60% energy from carbohydrates, less than '200mg'$  of dietary cholesterol, 20-35 gm of total fiber daily;Understanding of distribution of calorie intake throughout the day with the consumption of 4-5 meals/snacks    Hypertension Yes    Intervention Monitor prescription use compliance.;Provide education on lifestyle modifcations including regular physical activity/exercise, weight management, moderate sodium restriction and increased consumption of fresh fruit, vegetables, and low fat dairy, alcohol moderation, and smoking cessation.    Expected Outcomes Short Term: Continued assessment and intervention until BP is < 140/69m HG in hypertensive participants. < 130/830mHG in hypertensive participants with diabetes, heart failure or chronic kidney disease.;Long Term: Maintenance of blood pressure at goal levels.    Lipids  Yes    Intervention Provide  education and support for participant on nutrition & aerobic/resistive exercise along with prescribed medications to achieve LDL '70mg'$ , HDL >'40mg'$ .    Expected Outcomes Short Term: Participant states understanding of desired cholesterol values and is compliant with medications prescribed. Participant is following exercise prescription and nutrition guidelines.;Long Term: Cholesterol controlled with medications as prescribed, with individualized exercise RX and with personalized nutrition plan. Value goals: LDL < '70mg'$ , HDL > 40 mg.             Education:Diabetes - Individual verbal and written instruction to review signs/symptoms of diabetes, desired ranges of glucose level fasting, after meals and with exercise. Acknowledge that pre and post exercise glucose checks will be done for 3 sessions at entry of program.   Core Components/Risk Factors/Patient Goals Review:   Goals and Risk Factor Review     Row Name 10/29/22 0720 10/29/22 0734           Core Components/Risk Factors/Patient Goals Review   Personal Goals Review Tobacco Cessation Weight Management/Obesity;Hypertension;Lipids      Review Andrea [name] has quit smoking in September and she plans to stay that way. She has not had any tobacco products since she quit. She does not use any medications or suppliments to help her quit smoking. She has read a book that helped her quit and will stay that way. Andrea [name] has a BP at cuff at home to use but usually will only check it if she feels bad. Encouraged her to check more frequently at home. Her pressures are good at rehab. She checks her weight and has lost 2 lb since starting the program, but trying to lose more once she starts making he diet changes (drinking more water, reducing sodium, portion sizes). She is staying compliant with all of her medications. Her doctor recently made a medication switch to hopefully help relieve some mild chest pain she has been experiencing.       Expected Outcomes Short: meet quit date in March. Long: stay tobacco free. Short:Continue to work on weight loss Long: continue to manage lifestyle risk factors               Core Components/Risk Factors/Patient Goals at Discharge (Final Review):   Goals and Risk Factor Review - 10/29/22 0734       Core Components/Risk Factors/Patient Goals Review   Personal Goals Review Weight Management/Obesity;Hypertension;Lipids    Review Andrea [name] has a BP at cuff at home to use but usually will only check it if she feels bad. Encouraged her to check more frequently at home. Her pressures are good at rehab. She checks her weight and has lost 2 lb since starting the program, but trying to lose more once she starts making he diet changes (drinking more water, reducing sodium, portion sizes). She is staying compliant with all of her medications. Her doctor recently made a medication switch to hopefully help relieve some mild chest pain she has been experiencing.    Expected Outcomes Short:Continue to work on weight loss Long: continue to manage lifestyle risk factors             ITP Comments:  ITP Comments     Row Name 10/06/22 1502 10/07/22 1628 10/07/22 1718 10/07/22 1722 10/12/22 0756   ITP Comments Initial phone call completed. Diagnosis can [facility] in [facility] 12[facility]6. EP Orientation scheduled for Wednesday 12/13 at 2:30. Completed 6MWT and gym orientation. Initial ITP created and sent for review to Dr. Emily Filbert, Medical Director. Andrea [name] has recently quit tobacco  use within the last 6 months. Her quite date was 06/26/22. Intervention for relapse prevention was provided at the initial medical review. He was encouraged to continue to with tobacco cessation and was provided information on relapse prevention. Patient received information about combination therapy, tobacco cessation classes, quit line, and quit smoking apps in case of a relapse. Patient demonstrated understanding of this material.Staff will  continue to provide encouragement and follow up with the patient throughout the program. Patient was advised to follow up with Dr. Fletcher Anon first before returning to rehab. Per D/C notes, patient was supposed to have a hospital follow up. Patient is experiencing recurrent chest pain during activity. Small EKG changes noted today after 6MWT. Chest pain resolves at rest. EKG strips sent over to Dr. Fletcher Anon for review and will receive clearance for patient to return. Staff discussed with patient when to call 911. First full day of exercise!  Patient was oriented to gym and equipment including functions, settings, policies, and procedures.  Patient's individual exercise prescription and treatment plan were reviewed.  All starting workloads were established based on the results of the 6 minute walk test done at initial orientation visit.  The plan for exercise progression was also introduced and progression will be customized based on patient's performance and goals.    [place] Name 10/21/22 1007 10/29/22 0718 11/18/22 0915       ITP Comments 30 Day review completed. Medical Director ITP review done, changes made as directed, and signed approval by Medical Director.     new to program Andrea [name] has quit smoking in September and she plans to stay that way. She has not had any tobacco products since she quit. She does not use any medications or suppliments to help her quit smoking. She has read a book that helped her quit and will stay that way. 30 Day review completed. Medical Director ITP review done, changes made as directed, and signed approval by Medical Director.              Comments:

## 2022-11-19 ENCOUNTER — Encounter: Payer: 59 | Admitting: *Deleted

## 2022-11-19 DIAGNOSIS — I252 Old myocardial infarction: Secondary | ICD-10-CM | POA: Diagnosis not present

## 2022-11-19 DIAGNOSIS — I214 Non-ST elevation (NSTEMI) myocardial infarction: Secondary | ICD-10-CM

## 2022-11-19 NOTE — Progress Notes (Signed)
Daily Session Note  Patient Details  Name: Andrea [name] MRN: [id number] Date of Birth: 22-Sep-1971 Referring Provider:   Flowsheet Row Cardiac Rehab from 10/07/2022 in [facility] Cardiac and Pulmonary Rehab  Referring Provider Kathlyn Sacramento MD       Encounter Date: 11/19/2022  Check In:  Session Check In - 11/19/22 0724       Check-In   Supervising physician immediately available to respond to emergencies See telemetry face sheet for immediately available ER MD    Location [facility]-Cardiac & Pulmonary Rehab    Staff Present Darlyne Russian, RN, Lorin Mercy, MS, ACSM CEP, Exercise Physiologist;Joseph Tessie Fass, [place]    Virtual Visit No    Medication changes reported     No    Fall or balance concerns reported    No    Tobacco Cessation No Change    Current number of cigarettes/nicotine per day     0    Warm-up and Cool-down Performed on first and last piece of equipment    Resistance Training Performed Yes    VAD Patient? No    PAD/SET Patient? No      Pain Assessment   Currently in Pain? No/denies                Social History   Tobacco Use  Smoking Status Former   Packs/day: 0.75   Years: 35.00   Total pack years: 26.25   Types: Cigarettes   Quit date: 06/25/2022   Years since quitting: 0.4  Smokeless Tobacco Never    Goals Met:  Independence with exercise equipment Exercise tolerated well No report of concerns or symptoms today Strength training completed today  Goals Unmet:  Not Applicable  Comments: Pt able to follow exercise prescription today without complaint.  Will continue to monitor for progression.    Dr. Emily Filbert is Medical Director for [place].  Dr. Ottie Glazier is Medical Director for [facility] Pulmonary Rehabilitation.

## 2022-11-23 ENCOUNTER — Encounter: Payer: Self-pay | Admitting: Medical

## 2022-11-23 ENCOUNTER — Encounter: Payer: 59 | Admitting: *Deleted

## 2022-11-23 ENCOUNTER — Ambulatory Visit: Payer: 59 | Attending: Medical | Admitting: Medical

## 2022-11-23 VITALS — BP 120/76 | HR 61 | Ht 67.0 in | Wt 252.5 lb

## 2022-11-23 DIAGNOSIS — I10 Essential (primary) hypertension: Secondary | ICD-10-CM

## 2022-11-23 DIAGNOSIS — I471 Supraventricular tachycardia, unspecified: Secondary | ICD-10-CM | POA: Diagnosis not present

## 2022-11-23 DIAGNOSIS — E782 Mixed hyperlipidemia: Secondary | ICD-10-CM

## 2022-11-23 DIAGNOSIS — I214 Non-ST elevation (NSTEMI) myocardial infarction: Secondary | ICD-10-CM | POA: Diagnosis not present

## 2022-11-23 DIAGNOSIS — I252 Old myocardial infarction: Secondary | ICD-10-CM | POA: Diagnosis not present

## 2022-11-23 MED ORDER — DILTIAZEM HCL ER COATED BEADS 120 MG PO CP24: 120.0000 mg | ORAL_CAPSULE | Freq: Every day | ORAL | 1 refills | Status: DC

## 2022-11-23 MED ORDER — ATORVASTATIN CALCIUM 40 MG PO TABS: 40.0000 mg | ORAL_TABLET | Freq: Every evening | ORAL | 1 refills | Status: DC

## 2022-11-23 NOTE — Patient Instructions (Signed)
Medication Instructions:  No changes *If you need a refill on your cardiac medications before your next appointment, please call your pharmacy*   Lab Work: None ordered If you have labs (blood work) drawn today and your tests are completely normal, you will receive your results only by: [place] (if you have MyChart) OR A paper copy in the mail If you have any lab test that is abnormal or we need to change your treatment, we will call you to review the results.   Testing/Procedures: None ordered   Follow-Up: At [facility], you and your health needs are our priority.  As part of our continuing mission to provide you with exceptional heart care, we have created designated Provider Care Teams.  These Care Teams include your primary Cardiologist (physician) and Advanced Practice Providers (APPs -  Physician Assistants and Nurse Practitioners) who all work together to provide you with the care you need, when you need it.  We recommend signing up for the patient portal called "MyChart".  Sign up information is provided on this After Visit Summary.  MyChart is used to connect with patients for Virtual Visits (Telemedicine).  Patients are able to view lab/test results, encounter notes, upcoming appointments, etc.  Non-urgent messages can be sent to your provider as well.   To learn more about what you can do with MyChart, go to [email or web address].    Your next appointment:   6 month(s)  Provider:   Mertie Moores, MD

## 2022-11-23 NOTE — Progress Notes (Signed)
Daily Session Note  Patient Details  Name: Andrea [name] MRN: [id number] Date of Birth: 1971/03/23 Referring Provider:   Flowsheet Row Cardiac Rehab from 10/07/2022 in [facility] Cardiac and Pulmonary Rehab  Referring Provider Kathlyn Sacramento MD       Encounter Date: 11/23/2022  Check In:  Session Check In - 11/23/22 0732       Check-In   Supervising physician immediately available to respond to emergencies See telemetry face sheet for immediately available ER MD    Location [facility]-Cardiac & Pulmonary Rehab    Staff Present Darlyne Russian, RN, Doyce Para, BS, ACSM CEP, Exercise Physiologist;Joseph Tessie Fass, [place]    Virtual Visit No    Medication changes reported     No    Fall or balance concerns reported    No    Tobacco Cessation No Change    Warm-up and Cool-down Performed on first and last piece of equipment    Resistance Training Performed Yes    VAD Patient? No    PAD/SET Patient? No      Pain Assessment   Currently in Pain? No/denies                Social History   Tobacco Use  Smoking Status Former   Packs/day: 0.75   Years: 35.00   Total pack years: 26.25   Types: Cigarettes   Quit date: 06/25/2022   Years since quitting: 0.4  Smokeless Tobacco Never    Goals Met:  Independence with exercise equipment Exercise tolerated well Personal goals reviewed No report of concerns or symptoms today Strength training completed today  Goals Unmet:  Not Applicable  Comments: Pt able to follow exercise prescription today without complaint.  Will continue to monitor for progression.    Dr. Emily Filbert is Medical Director for [place].  Dr. Ottie Glazier is Medical Director for [facility] Pulmonary Rehabilitation.

## 2022-11-23 NOTE — Progress Notes (Signed)
Cardiology Office Note:    Date:  11/23/2022   ID:  [place], Zirkelbach [birth year], MRN [id number]  PCP:  Jilda Panda, MD  [facility] Cardiologist:  Mertie Moores, MD  Center Electrophysiologist:  None   Referring MD: Jilda Panda, MD   Chief Complaint: 6-8 week follow-up  History of Present Illness:    Andrea [name] is a 52 y.o. female with a hx of  HTN, dyslipidemia, fatigue, GERD, h/o palpitations, iron deficinecy anemia, former smoker, family history of early onset CAD who is being seen for hospital follow-up for NSTEMI.    She was evaluated in November 2018 for several months of chest pain. She underwent Coronary CTA with a coronary calciums score of 0.    Recently admitted for NSTEMI. Echo showed normal LVSF. Myoview leixscna was abnormal so she underwent cardiac cath. This showed normal coronaries. She was started on Imdur.    She was last seen 10/09/22 and the patient reported pain and shortness of breath with exertion at rehab.EKG strips showed ST depression with TWI, which baseline EKG shows TWI lateral leads. She reported chest pain similar to SVT episodes. Imdur was not helping. Given possible vasospasms, Lopressor and Imdur were stopped and she was started on diltiazem '120mg'$  daily. CBC was checked for anemia 13>9.9 over the last year.   Heart monitor (preliminary results) showed NSR with 1 short run of SVT  Today, the patient reports some aching of her joints. After she gest active this improves. She denies further chest pain. Hgb was 10.8. Occasional she has heart racing that is occasional. The patient reports she is also having anxiety.   Past Medical History:  Diagnosis Date   Abnormal Pap smear of cervix    2010-2014 abnormal pap every 3-6 months   dysplastic squamous cells of cervix    Anxiety    occ.- no meds   Blood in stool    Depression    no meds   Diverticulitis 08/2019   DUB (dysfunctional uterine bleeding)    Dyslipidemia     Dyspnea    Resolved - due to anemia - no current problems   Eczema    Endometriosis    Epigastric pain    Fatigue    Fibroid    GERD (gastroesophageal reflux disease)    Grade I diastolic dysfunction 31/54/0086   Noted on ECHO   Hemorrhoid    2nd and 3rd degree   History of adenomatous polyp of colon    History of palpitations    HSV infection    Hx of dysplastic nevus 07/17/2019   L upper back 4.0cm lateral to spine, moderate   Hyperlipidemia    diet controlled - no meds   Hypertension    not currently taking medication   Infertility, female    Iron deficiency anemia    Last iron infusion 09/14/2018   Left groin hernia    history of   Muscle cramping    with exertion   Obesity    Palpitations    Recurrent bacterial infection    Vaginal   Restless leg    due to anemia   Smoker    SVD (spontaneous vaginal delivery)    x 2   Thoracic back pain    while trying to sleep   Tinnitus, left    hears heart beat, MRI normal   Vertigo 2018    Past Surgical History:  Procedure Laterality Date   [place]  anemia     due to gyn blood losses from uterine fibroids.  received PRBCs and iron infusions.     CERVICAL BIOPSY  W/ LOOP ELECTRODE EXCISION     CERVICAL CONIZATION W/BX N/A 09/20/2018   Procedure: CONIZATION CERVIX WITH BIOPSY;  Surgeon: Nunzio Cobbs, MD;  Location: [facility];  Service: Gynecology;  Laterality: N/A;   COLONOSCOPY  06/2018   Dr Earlie Raveling.  9 mm sessile, hyperplastic sigmoid polyp.  diverticulosis.  int hemorrhoids.  2 cm cecal lipoma   COLPOSCOPY     x 3   CYSTOSCOPY N/A 11/14/2018   Procedure: CYSTOSCOPY;  Surgeon: Nunzio Cobbs, MD;  Location: [place] [facility];  Service: Gynecology;  Laterality: N/A;   DILATION AND CURETTAGE OF UTERUS  1993   DILATION AND CURETTAGE OF UTERUS N/A 09/20/2018   Procedure: FRACTIONAL DILATATION AND CURETTAGE;  Surgeon: Nunzio Cobbs, MD;  Location:  [facility];  Service: Gynecology;  Laterality: N/A;   GYNECOLOGIC CRYOSURGERY     HEMORRHOID BANDING     several   HERNIA REPAIR Left    childhood   INTRAUTERINE DEVICE (IUD) INSERTION  2013    (Mirena)   INTRAUTERINE DEVICE INSERTION  2000   [place]   IUD REMOVAL  2010   IUD REMOVAL  2018   LEFT HEART CATH AND CORONARY ANGIOGRAPHY N/A 10/02/2022   Procedure: LEFT HEART CATH AND CORONARY ANGIOGRAPHY;  Surgeon: Wellington Hampshire, MD;  Location: [place] [facility];  Service: Cardiovascular;  Laterality: N/A;   TOTAL LAPAROSCOPIC HYSTERECTOMY WITH SALPINGECTOMY Bilateral 11/14/2018   Procedure: TOTAL LAPAROSCOPIC HYSTERECTOMY WITH BILATERAL SALPINGECTOMY AND LYSIS OF ADHESIONS;  Surgeon: Nunzio Cobbs, MD;  Location: [facility];  Service: Gynecology;  Laterality: Bilateral;   TUBAL LIGATION     2010   UPPER GI ENDOSCOPY  06/2018   x 2   WISDOM TOOTH EXTRACTION      Current Medications: Current Meds  Medication Sig   acetaminophen (TYLENOL) 325 MG tablet Take 2 tablets (650 mg total) by mouth every 6 (six) hours as needed for mild pain (or temp > 100).   aspirin EC 81 MG tablet Take 1 tablet (81 mg total) by mouth daily. Swallow whole.   clopidogrel (PLAVIX) 75 MG tablet Take 1 tablet (75 mg total) by mouth daily with breakfast.   fenofibrate 160 MG tablet Take 1 tablet (160 mg total) by mouth daily.   hydrocortisone (ANUSOL-HC) 2.5 % rectal cream    pantoprazole (PROTONIX) 40 MG tablet Take 1 tablet (40 mg total) by mouth daily.   [DISCONTINUED] atorvastatin (LIPITOR) 40 MG tablet Take 1 tablet (40 mg total) by mouth every evening.   [DISCONTINUED] diltiazem (CARDIZEM CD) 120 MG 24 hr capsule Take 1 capsule (120 mg total) by mouth daily.     Allergies:   Patient has no known allergies.   Social History   Socioeconomic History   Marital status: Married    Spouse name: Not on file   Number of children: 2   Years of education: Not on file   Highest  education level: Not on file  Occupational History   Not on file  Tobacco Use   Smoking status: Former    Packs/day: 0.75    Years: 35.00    Total pack years: 26.25    Types: Cigarettes    Quit date: 06/25/2022    Years since quitting: 0.4   Smokeless tobacco:  Never  Vaping Use   Vaping Use: Never used  Substance and Sexual Activity   Alcohol use: Yes    Comment: 4-5 drinks a week (alcohol)  Red Bull and Vodka or red wine   Drug use: Never   Sexual activity: Yes    Partners: Male    Birth control/protection: Surgical    Comment: BTL  Other Topics Concern   Not on file  Social History Narrative   Not on file   Social Determinants of Health   Financial Resource Strain: Not on file  Food Insecurity: No Food Insecurity (09/30/2022)   Hunger Vital Sign    Worried About Running Out of Food in the Last Year: Never true    Ran Out of Food in the Last Year: Never true  Transportation Needs: No Transportation Needs (09/30/2022)   PRAPARE - Hydrologist (Medical): No    Lack of Transportation (Non-Medical): No  Physical Activity: Not on file  Stress: Not on file  Social Connections: Not on file     Family History: The patient's family history includes Alzheimer's disease in her maternal grandfather; Cancer (age of onset: 34) in her mother; Heart disease in her father and paternal grandfather; Heart failure in her paternal aunt; Other in her brother, maternal grandfather, paternal grandfather, and paternal grandmother.  ROS:   Please see the history of present illness.     All other systems reviewed and are negative.  EKGs/Labs/Other Studies Reviewed:    The following studies were reviewed today:  Heart monitor 09/2022 Preliminary results: NSR with average Hr 84bpm, 1 run SVT lasting 5 beats  LHC 09/2022   The left ventricular systolic function is normal.   LV end diastolic pressure is mildly elevated.   The left ventricular ejection fraction  is 55-65% by visual estimate.   1.  Normal coronary arteries. 2.  Normal LV systolic function mildly elevated left ventricular end-diastolic pressure.   Recommendations: The patient has [place] (myocardial infarction without obstructive coronary artery disease).  Recommend medical therapy.  I added clopidogrel to be used for 1 year.  I added Imdur 30 mg once daily. The patient can likely be discharged home later today. CMD testing can be considered as an outpatient if the patient continues to have symptoms.  Echo 09/30/2022   The left ventricular systolic function is normal.   LV end diastolic pressure is mildly elevated.   The left ventricular ejection fraction is 55-65% by visual estimate.   1.  Normal coronary arteries. 2.  Normal LV systolic function mildly elevated left ventricular end-diastolic pressure.   Recommendations: The patient has [place] (myocardial infarction without obstructive coronary artery disease).  Recommend medical therapy.  I added clopidogrel to be used for 1 year.  I added Imdur 30 mg once daily. The patient can likely be discharged home later today. CMD testing can be considered as an outpatient if the patient continues to have symptoms.  EKG:  EKG is not ordered today.  The ekg ordered today demonstrates   Recent Labs: 09/30/2022: ALT 16 10/02/2022: BUN 12; Creatinine, Ser 0.68; Potassium 4.1; Sodium 139 10/09/2022: Hemoglobin 10.8; Platelets 332  Recent Lipid Panel    Component Value Date/Time   CHOL 200 09/30/2022 1442   CHOL 245 (H) 05/29/2022 1544   TRIG 229 (H) 09/30/2022 1442   HDL 43 09/30/2022 1442   HDL 39 (L) 05/29/2022 1544   CHOLHDL 4.7 09/30/2022 1442   VLDL 46 (H) 09/30/2022 1442  LDLCALC 111 (H) 09/30/2022 1442   LDLCALC 103 (H) 05/29/2022 1544    Physical Exam:    VS:  BP 120/76 (BP Location: Left Arm, Patient Position: Sitting, Cuff Size: Large)   Pulse 61   Ht '5\' 7"'$  (1.702 m)   Wt 252 lb 8 oz (114.5 kg)   LMP  (LMP Unknown)  Comment: LMP 06/2018  SpO2 98%   BMI 39.55 kg/m     Wt Readings from Last 3 Encounters:  11/23/22 252 lb 8 oz (114.5 kg)  10/09/22 250 lb (113.4 kg)  10/07/22 250 lb 14.4 oz (113.8 kg)     GEN: Well nourished, well developed in no acute distress HEENT: Normal NECK: No JVD; No carotid bruits LYMPHATICS: No lymphadenopathy CARDIAC: RRR, no murmurs, rubs, gallops RESPIRATORY:  Clear to auscultation without rales, wheezing or rhonchi  ABDOMEN: Soft, non-tender, non-distended MUSCULOSKELETAL:  No edema; No deformity  SKIN: Warm and dry NEUROLOGIC:  Alert and oriented x 3 PSYCHIATRIC:  Normal affect   ASSESSMENT:    1. NSTEMI (non-ST elevated myocardial infarction) ([place])   2. Essential hypertension   3. Hyperlipidemia, mixed   4. SVT (supraventricular tachycardia)    PLAN:    In order of problems listed above:  Prior NSTEMI Chest pain Hospitalization in 09/2022 with troponin elevated to 396. Echo showed normal LVEF. Myoview Lexiscan was abnormal so LHC was ordered. LHC showed no CAD. The patient is still doing cardiac rehab. She had persistent chest pain and Imdur was added, however this caused a headache. Imdur and Lopressor were stopped and diltiazem was started for possible vasospasm. Plan to continue DAPT with ASA and Plavix for 1 year. Of note, Hgb was low to 9.9. Repeat CBC showed Hgb 10.8. She is overall feeling better and has not had recurrent chest pain. She feels chest pain may have been exacerbated by low Hgb.   HTN BP is good today, continue current medications.   HLD LDL 111, TG 229, HSL 43. Continue fenofibrate and Lipitor. Can re-check labs at follow-up.   pSVT pSVT seen on the heart monitor in 2022. She was switched to diltiazem and reports occasional palpitations. She may want to switch back to metoprolol.   Disposition: Follow up in 3 month(s) with MD   Signed, Mamie Diiorio Ninfa Meeker, PA-C  11/23/2022 8:10 PM    [facility]

## 2022-11-24 ENCOUNTER — Encounter: Payer: 59 | Admitting: *Deleted

## 2022-11-24 DIAGNOSIS — I214 Non-ST elevation (NSTEMI) myocardial infarction: Secondary | ICD-10-CM

## 2022-11-24 DIAGNOSIS — I252 Old myocardial infarction: Secondary | ICD-10-CM | POA: Diagnosis not present

## 2022-11-24 NOTE — Progress Notes (Signed)
Daily Session Note  Patient Details  Name: Andrea [name] MRN: [id number] Date of Birth: 02/20/1971 Referring Provider:   Flowsheet Row Cardiac Rehab from 10/07/2022 in [facility] Cardiac and Pulmonary Rehab  Referring Provider Kathlyn Sacramento MD       Encounter Date: 11/24/2022  Check In:  Session Check In - 11/24/22 0806       Check-In   Supervising physician immediately available to respond to emergencies See telemetry face sheet for immediately available ER MD    Location [facility]-Cardiac & Pulmonary Rehab    Staff Present Heath Lark, RN, BSN, CCRP;Jessica Zemple, MA, RCEP, CCRP, Bertram Gala, MS, ACSM CEP, Exercise Physiologist    Virtual Visit No    Medication changes reported     No    Fall or balance concerns reported    No    Warm-up and Cool-down Performed on first and last piece of equipment    Resistance Training Performed Yes    VAD Patient? No    PAD/SET Patient? No      Pain Assessment   Currently in Pain? No/denies                Social History   Tobacco Use  Smoking Status Former   Packs/day: 0.75   Years: 35.00   Total pack years: 26.25   Types: Cigarettes   Quit date: 06/25/2022   Years since quitting: 0.4  Smokeless Tobacco Never    Goals Met:  Independence with exercise equipment Exercise tolerated well No report of concerns or symptoms today  Goals Unmet:  Not Applicable  Comments: Pt able to follow exercise prescription today without complaint.  Will continue to monitor for progression.    Dr. Emily Filbert is Medical Director for [place].  Dr. Ottie Glazier is Medical Director for [facility] Pulmonary Rehabilitation.

## 2022-11-26 ENCOUNTER — Encounter: Payer: 59 | Attending: Cardiovascular Disease | Admitting: *Deleted

## 2022-11-26 DIAGNOSIS — I214 Non-ST elevation (NSTEMI) myocardial infarction: Secondary | ICD-10-CM

## 2022-11-26 DIAGNOSIS — Z5189 Encounter for other specified aftercare: Secondary | ICD-10-CM | POA: Insufficient documentation

## 2022-11-26 DIAGNOSIS — I252 Old myocardial infarction: Secondary | ICD-10-CM | POA: Diagnosis not present

## 2022-11-26 NOTE — Progress Notes (Signed)
Daily Session Note  Patient Details  Name: Andrea [name] MRN: [id number] Date of Birth: 1970-12-31 Referring Provider:   Flowsheet Row Cardiac Rehab from 10/07/2022 in [facility] Cardiac and Pulmonary Rehab  Referring Provider Kathlyn Sacramento MD       Encounter Date: 11/26/2022  Check In:  Session Check In - 11/26/22 0716       Check-In   Supervising physician immediately available to respond to emergencies See telemetry face sheet for immediately available ER MD    Location [facility]-Cardiac [facility] Pulmonary Rehab    Staff Present Darlyne Russian, RN, Lorin Mercy, MS, ACSM CEP, Exercise Physiologist;Joseph Tessie Fass, [place]    Virtual Visit No    Medication changes reported     No    Fall or balance concerns reported    No    Tobacco Cessation No Change    Current number of cigarettes/nicotine per day     0    Warm-up and Cool-down Performed on first and last piece of equipment    Resistance Training Performed Yes    VAD Patient? No    PAD/SET Patient? No      Pain Assessment   Currently in Pain? No/denies                Social History   Tobacco Use  Smoking Status Former   Packs/day: 0.75   Years: 35.00   Total pack years: 26.25   Types: Cigarettes   Quit date: 06/25/2022   Years since quitting: 0.4  Smokeless Tobacco Never    Goals Met:  Independence with exercise equipment Exercise tolerated well No report of concerns or symptoms today Strength training completed today  Goals Unmet:  Not Applicable  Comments: Pt able to follow exercise prescription today without complaint.  Will continue to monitor for progression.    Dr. Emily Filbert is Medical Director for [place].  Dr. Ottie Glazier is Medical Director for [facility] Pulmonary Rehabilitation.

## 2022-11-30 ENCOUNTER — Encounter: Payer: 59 | Admitting: *Deleted

## 2022-11-30 DIAGNOSIS — I252 Old myocardial infarction: Secondary | ICD-10-CM | POA: Diagnosis not present

## 2022-11-30 DIAGNOSIS — I214 Non-ST elevation (NSTEMI) myocardial infarction: Secondary | ICD-10-CM

## 2022-11-30 NOTE — Progress Notes (Signed)
Daily Session Note  Patient Details  Name: Loza Prell MRN: [id number] Date of Birth: Mar 17, 1971 Referring Provider:   Flowsheet Row Cardiac Rehab from 10/07/2022 in [facility] Cardiac and Pulmonary Rehab  Referring Provider Kathlyn Sacramento MD       Encounter Date: 11/30/2022  Check In:  Session Check In - 11/30/22 0729       Check-In   Supervising physician immediately available to respond to emergencies See telemetry face sheet for immediately available ER MD    Location [facility]monary Rehab    Staff Present Darlyne Russian, RN, Doyce Para, BS, ACSM CEP, Exercise Physiologist;Joseph Tessie Fass, [place]    Virtual Visit No    Medication changes reported     No    Fall or balance concerns reported    No    Tobacco Cessation No Change    Current number of cigarettes/nicotine per day     0    Warm-up and Cool-down Performed on first and last piece of equipment    Resistance Training Performed Yes    VAD Patient? No    PAD/SET Patient? No      Pain Assessment   Currently in Pain? No/denies                Social History   Tobacco Use  Smoking Status Former   Packs/day: 0.75   Years: 35.00   Total pack years: 26.25   Types: Cigarettes   Quit date: 06/25/2022   Years since quitting: 0.4  Smokeless Tobacco Never    Goals Met:  Independence with exercise equipment Exercise tolerated well No report of concerns or symptoms today Strength training completed today  Goals Unmet:  Not Applicable  Comments: Pt able to follow exercise prescription today without complaint.  Will continue to monitor for progression.    Dr. Emily Filbert is Medical Director for [place].  Dr. Ottie Glazier is Medical Director for [facility] Pulmonary Rehabilitation.

## 2022-12-01 ENCOUNTER — Encounter: Payer: 59 | Admitting: *Deleted

## 2022-12-01 DIAGNOSIS — I252 Old myocardial infarction: Secondary | ICD-10-CM | POA: Diagnosis not present

## 2022-12-01 DIAGNOSIS — I214 Non-ST elevation (NSTEMI) myocardial infarction: Secondary | ICD-10-CM

## 2022-12-01 NOTE — Progress Notes (Signed)
Daily Session Note  Patient Details  Name: Andrea [name] MRN: [id number] Date of Birth: 09/23/1971 Referring Provider:   Flowsheet Row Cardiac Rehab from 10/07/2022 in [facility] Cardiac and Pulmonary Rehab  Referring Provider Kathlyn Sacramento MD       Encounter Date: 12/01/2022  Check In:  Session Check In - 12/01/22 0747       Check-In   Supervising physician immediately available to respond to emergencies See telemetry face sheet for immediately available ER MD    Location [facility]Cardiac [facility] Pulmonary Rehab    Staff Present Heath Lark, RN, BSN, Kathaleen Maser, MS, ACSM CEP, Exercise Physiologist;Jessica Luan Pulling, MA, RCEP, CCRP, CCET    Virtual Visit No    Medication changes reported     No    Fall or balance concerns reported    No    Warm-up and Cool-down Performed on first and last piece of equipment    Resistance Training Performed Yes    VAD Patient? No    PAD/SET Patient? No      Pain Assessment   Currently in Pain? No/denies                Social History   Tobacco Use  Smoking Status Former   Packs/day: 0.75   Years: 35.00   Total pack years: 26.25   Types: Cigarettes   Quit date: 06/25/2022   Years since quitting: 0.4  Smokeless Tobacco Never    Goals Met:  Independence with exercise equipment Exercise tolerated well No report of concerns or symptoms today  Goals Unmet:  Not Applicable  Comments: Pt able to follow exercise prescription today without complaint.  Will continue to monitor for progression.    Dr. Emily Filbert is Medical Director for [place].  Dr. Ottie Glazier is Medical Director for [facility] Pulmonary Rehabilitation.

## 2022-12-03 ENCOUNTER — Encounter: Payer: 59 | Admitting: *Deleted

## 2022-12-03 DIAGNOSIS — I252 Old myocardial infarction: Secondary | ICD-10-CM | POA: Diagnosis not present

## 2022-12-03 DIAGNOSIS — I214 Non-ST elevation (NSTEMI) myocardial infarction: Secondary | ICD-10-CM

## 2022-12-03 NOTE — Progress Notes (Signed)
Daily Session Note  Patient Details  Name: Andrea [name] MRN: [id number] Date of Birth: [birth year] Referring Provider:   Flowsheet Row Cardiac Rehab from 10/07/2022 in [facility] Cardiac and Pulmonary Rehab  Referring Provider Kathlyn Sacramento MD       Encounter Date: 12/03/2022  Check In:  Session Check In - 12/03/22 0729       Check-In   Supervising physician immediately available to respond to emergencies See telemetry face sheet for immediately available ER MD    Location [facility]Card[facility] Pulmonary Rehab    Staff Present Darlyne Russian, RN, ADN;Jessica Luan Pulling, MA, RCEP, CCRP, CCET;Joseph [place], [place]    Virtual Visit No    Medication changes reported     No    Fall or balance concerns reported    No    Tobacco Cessation No Change    Warm-up and Cool-down Performed on first and last piece of equipment    Resistance Training Performed Yes    VAD Patient? No    PAD/SET Patient? No      Pain Assessment   Currently in Pain? No/denies                Social History   Tobacco Use  Smoking Status Former   Packs/day: 0.75   Years: 35.00   Total pack years: 26.25   Types: Cigarettes   Quit date: 06/25/2022   Years since quitting: 0.4  Smokeless Tobacco Never    Goals Met:  Independence with exercise equipment Exercise tolerated well No report of concerns or symptoms today Strength training completed today  Goals Unmet:  Not Applicable  Comments: Pt able to follow exercise prescription today without complaint.  Will continue to monitor for progression.    Dr. Emily Filbert is Medical Director for [place].  Dr. Ottie Glazier is Medical Director for [facility] Pulmonary Rehabilitation.

## 2022-12-07 ENCOUNTER — Ambulatory Visit: Payer: 59 | Admitting: Dermatology

## 2022-12-10 ENCOUNTER — Encounter: Payer: 59 | Admitting: *Deleted

## 2022-12-10 DIAGNOSIS — I252 Old myocardial infarction: Secondary | ICD-10-CM | POA: Diagnosis not present

## 2022-12-10 DIAGNOSIS — I214 Non-ST elevation (NSTEMI) myocardial infarction: Secondary | ICD-10-CM

## 2022-12-10 NOTE — Progress Notes (Signed)
Daily Session Note  Patient Details  Name: Taye Maack MRN: [id number] Date of Birth: [birth year] Referring Provider:   Flowsheet Row Cardiac Rehab from 10/07/2022 in [facility] Cardiac and Pulmonary Rehab  Referring Provider Kathlyn Sacramento MD       Encounter Date: 12/10/2022  Check In:  Session Check In - 12/10/22 0729       Check-In   Location [facility]Cardiac [facility] Pulmonary Rehab    Staff Present Darlyne Russian, RN, ADN;Jessica Luan Pulling, MA, RCEP, CCRP, Bertram Gala, MS, ACSM CEP, Exercise Physiologist;Joseph Tessie Fass, [place]    Virtual Visit No    Medication changes reported     No    Fall or balance concerns reported    No    Tobacco Cessation No Change    Warm-up and Cool-down Performed on first and last piece of equipment    Resistance Training Performed Yes    VAD Patient? No    PAD/SET Patient? No      Pain Assessment   Currently in Pain? No/denies                Social History   Tobacco Use  Smoking Status Former   Packs/day: 0.75   Years: 35.00   Total pack years: 26.25   Types: Cigarettes   Quit date: 06/25/2022   Years since quitting: 0.4  Smokeless Tobacco Never    Goals Met:  Independence with exercise equipment Exercise tolerated well No report of concerns or symptoms today Strength training completed today  Goals Unmet:  Not Applicable  Comments: Pt able to follow exercise prescription today without complaint.  Will continue to monitor for progression.    Dr. Emily Filbert is Medical Director for [place].  Dr. Ottie Glazier is Medical Director for [facility] Pulmonary Rehabilitation.

## 2022-12-14 ENCOUNTER — Encounter: Payer: 59 | Admitting: *Deleted

## 2022-12-14 DIAGNOSIS — I252 Old myocardial infarction: Secondary | ICD-10-CM | POA: Diagnosis not present

## 2022-12-14 DIAGNOSIS — I214 Non-ST elevation (NSTEMI) myocardial infarction: Secondary | ICD-10-CM

## 2022-12-14 NOTE — Progress Notes (Signed)
Daily Session Note  Patient Details  Name: Andrea [name] MRN: [id number] Date of Birth: January 23, 1971 Referring Provider:   Flowsheet Row Cardiac Rehab from 10/07/2022 in [facility]  Referring Provider Kathlyn Sacramento MD       Encounter Date: 12/14/2022  Check In:  Session Check In - 12/14/22 0732       Check-In   Supervising physician immediately available to respond to emergencies See telemetry face sheet for immediately available ER MD    Location [facility]-Cardiac & Pulmonary Rehab    Staff Present Darlyne Russian, RN, Doyce Para, BS, ACSM CEP, Exercise Physiologist;Jessica Hampton [place], MA, RCEP, CCRP, CCET;Joseph Hammond, [place]    Virtual Visit No    Medication changes reported     No    Fall or balance concerns reported    No    Tobacco Cessation No Change    Warm-up and Cool-down Performed on first and last piece of equipment    Resistance Training Performed Yes    VAD Patient? No    PAD/SET Patient? No      Pain Assessment   Currently in Pain? No/denies                Social History   Tobacco Use  Smoking Status Former   Packs/day: 0.75   Years: 35.00   Total pack years: 26.25   Types: Cigarettes   Quit date: 06/25/2022   Years since quitting: 0.4  Smokeless Tobacco Never    Goals Met:  Independence with exercise equipment Exercise tolerated well No report of concerns or symptoms today Strength training completed today  Goals Unmet:  Not Applicable  Comments: Pt able to follow exercise prescription today without complaint.  Will continue to monitor for progression.    Dr. Emily Filbert is Medical Director for [place].  Dr. Ottie Glazier is Medical Director for [facility] Pulmonary Rehabilitation.

## 2022-12-15 ENCOUNTER — Encounter: Payer: 59 | Admitting: *Deleted

## 2022-12-15 DIAGNOSIS — I214 Non-ST elevation (NSTEMI) myocardial infarction: Secondary | ICD-10-CM

## 2022-12-15 NOTE — Progress Notes (Signed)
Daily Session Note  Patient Details  Name: Andrea [name] MRN: [id number] Date of Birth: [birth year] Referring Provider:   Flowsheet Row Cardiac Rehab from 10/07/2022 in [facility] Cardiac and Pulmonary Rehab  Referring Provider Kathlyn Sacramento MD       Encounter Date: 12/15/2022  Check In:  Session Check In - 12/15/22 0743       Check-In   Supervising physician immediately available to respond to emergencies See telemetry face sheet for immediately available ER MD    Location [facility]-Cardiac & Pulmonary Rehab    Staff Present Nyoka Cowden, RN, BSN, Willette Pa, MA, RCEP, CCRP, Bertram Gala, MS, ACSM CEP, Exercise Physiologist    Virtual Visit No    Medication changes reported     No    Fall or balance concerns reported    No    Tobacco Cessation No Change    Warm-up and Cool-down Performed on first and last piece of equipment    Resistance Training Performed Yes    VAD Patient? No    PAD/SET Patient? No      Pain Assessment   Currently in Pain? No/denies    Multiple Pain Sites No                Social History   Tobacco Use  Smoking Status Former   Packs/day: 0.75   Years: 35.00   Total pack years: 26.25   Types: Cigarettes   Quit date: 06/25/2022   Years since quitting: 0.4  Smokeless Tobacco Never    Goals Met:  Independence with exercise equipment Changing diet to healthy choices, watching portion sizes No report of concerns or symptoms today  Goals Unmet:  Not Applicable  Comments: Pt able to follow exercise prescription today without complaint.  Will continue to monitor for progression.    Dr. Emily Filbert is Medical Director for [place].  Dr. Ottie Glazier is Medical Director for [facility] Pulmonary Rehabilitation.

## 2022-12-16 ENCOUNTER — Encounter: Payer: Self-pay | Admitting: *Deleted

## 2022-12-16 DIAGNOSIS — I214 Non-ST elevation (NSTEMI) myocardial infarction: Secondary | ICD-10-CM

## 2022-12-16 NOTE — Progress Notes (Signed)
Cardiac Individual Treatment Plan  Patient Details  Name: Andrea [name] MRN: [id number] Date of Birth: [birth year] Referring Provider:   Flowsheet Row Cardiac Rehab from 10/07/2022 in [facility] Cardiac and Pulmonary Rehab  Referring Provider Kathlyn Sacramento MD       Initial Encounter Date:  Flowsheet Row Cardiac Rehab from 10/07/2022 in [facility] Cardiac and Pulmonary Rehab  Date 10/07/22       Visit Diagnosis: NSTEMI (non-ST elevated myocardial infarction) [facility])  Patient's Home Medications on Admission:  Current Outpatient Medications:    acetaminophen (TYLENOL) 325 MG tablet, Take 2 tablets (650 mg total) by mouth every 6 (six) hours as needed for mild pain (or temp > 100)., Disp:  , Rfl:    aspirin EC 81 MG tablet, Take 1 tablet (81 mg total) by mouth daily. Swallow whole., Disp: 30 tablet, Rfl: 12   atorvastatin (LIPITOR) 40 MG tablet, Take 1 tablet (40 mg total) by mouth every evening., Disp: 90 tablet, Rfl: 1   clopidogrel (PLAVIX) 75 MG tablet, Take 1 tablet (75 mg total) by mouth daily with breakfast., Disp: 90 tablet, Rfl: 3   diltiazem (CARDIZEM CD) 120 MG 24 hr capsule, Take 1 capsule (120 mg total) by mouth daily., Disp: 90 capsule, Rfl: 1   fenofibrate 160 MG tablet, Take 1 tablet (160 mg total) by mouth daily., Disp: 180 tablet, Rfl: 3   hydrocortisone (ANUSOL-HC) 2.5 % rectal cream, , Disp: , Rfl:    pantoprazole (PROTONIX) 40 MG tablet, Take 1 tablet (40 mg total) by mouth daily., Disp: 30 tablet, Rfl: 11  Past Medical History: Past Medical History:  Diagnosis Date   Abnormal Pap smear of cervix    2010-2014 abnormal pap every 3-6 months   dysplastic squamous cells of cervix    Anxiety    occ.- no meds   Blood in stool    Depression    no meds   Diverticulitis 08/2019   DUB (dysfunctional uterine bleeding)    Dyslipidemia    Dyspnea    Resolved - due to anemia - no current problems   Eczema    Endometriosis    Epigastric pain    Fatigue    Fibroid     GERD (gastroesophageal reflux disease)    Grade I diastolic dysfunction 0000000   Noted on ECHO   Hemorrhoid    2nd and 3rd degree   History of adenomatous polyp of colon    History of palpitations    HSV infection    Hx of dysplastic nevus 07/17/2019   L upper back 4.0cm lateral to spine, moderate   Hyperlipidemia    diet controlled - no meds   Hypertension    not currently taking medication   Infertility, female    Iron deficiency anemia    Last iron infusion 09/14/2018   Left groin hernia    history of   Muscle cramping    with exertion   Obesity    Palpitations    Recurrent bacterial infection    Vaginal   Restless leg    due to anemia   Smoker    SVD (spontaneous vaginal delivery)    x 2   Thoracic back pain    while trying to sleep   Tinnitus, left    hears heart beat, MRI normal   Vertigo 2018    Tobacco Use: Social History   Tobacco Use  Smoking Status Former   Packs/day: 0.75   Years: 35.00   Total pack years: 26.25  Types: Cigarettes   Quit date: 06/25/2022   Years since quitting: 0.4  Smokeless Tobacco Never    Labs: Review Flowsheet       Latest Ref Rng & Units 05/29/2022 09/30/2022  Labs for ITP Cardiac and Pulmonary Rehab  Cholestrol 0 - 200 mg/dL 245  200   LDL (calc) 0 - 99 mg/dL 103  111   HDL-C >40 mg/dL 39  43   Trlycerides <150 mg/dL 602  229      Exercise Target Goals: Exercise Program Goal: Individual exercise prescription set using results from initial 6 min walk test and THRR while considering  patient's activity barriers and safety.   Exercise Prescription Goal: Initial exercise prescription builds to 30-45 minutes a day of aerobic activity, 2-3 days per week.  Home exercise guidelines will be given to patient during program as part of exercise prescription that the participant will acknowledge.   Education: Aerobic Exercise: - Group verbal and visual presentation on the components of exercise prescription. Introduces  F.I.T.T principle from ACSM for exercise prescriptions.  Reviews F.I.T.T. principles of aerobic exercise including progression. Written material given at graduation. Flowsheet Row Cardiac Rehab from 12/10/2022 in [facility] Cardiac and Pulmonary Rehab  Education need identified 10/07/22       Education: Resistance Exercise: - Group verbal and visual presentation on the components of exercise prescription. Introduces F.I.T.T principle from ACSM for exercise prescriptions  Reviews F.I.T.T. principles of resistance exercise including progression. Written material given at graduation. Flowsheet Row Cardiac Rehab from 12/10/2022 in [facility] Cardiac and Pulmonary Rehab  Date 11/19/22  Educator [facility]  Instruction Review Code 1- Verbalizes Understanding        Education: Exercise & Equipment Safety: - Individual verbal instruction and demonstration of equipment use and safety with use of the equipment. Flowsheet Row Cardiac Rehab from 12/10/2022 in [facility] Cardiac and Pulmonary Rehab  Education need identified 10/07/22  Date 10/07/22  Educator [place]  Instruction Review Code 1- Verbalizes Understanding       Education: Exercise Physiology & General Exercise Guidelines: - Group verbal and written instruction with models to review the exercise physiology of the cardiovascular system and associated critical values. Provides general exercise guidelines with specific guidelines to those with heart or lung disease.  Flowsheet Row Cardiac Rehab from 12/10/2022 in [facility] Cardiac and Pulmonary Rehab  Education need identified 10/07/22  Date 11/05/22  Educator [facility]  Instruction Review Code 1- [employer] Understanding       Education: Flexibility, Balance, Mind/Body Relaxation: - Group verbal and visual presentation with interactive activity on the components of exercise prescription. Introduces F.I.T.T principle from ACSM for exercise prescriptions. Reviews F.I.T.T. principles of flexibility and balance exercise training  including progression. Also discusses the mind body connection.  Reviews various relaxation techniques to help reduce and manage stress (i.e. Deep breathing, progressive muscle relaxation, and visualization). Balance handout provided to take home. Written material given at graduation. Flowsheet Row Cardiac Rehab from 12/10/2022 in [facility] Cardiac and Pulmonary Rehab  Date 11/26/22  Educator [facility]  Instruction Review Code 1- Verbalizes Understanding       Activity Barriers & Risk Stratification:  Activity Barriers & Cardiac Risk Stratification - 10/06/22 1435       Activity Barriers & Cardiac Risk Stratification   Activity Barriers Back Problems   back pain but xray clear   Cardiac Risk Stratification High   still having chest pain            6 Minute Walk:  6 Minute Walk  [place] Name 10/07/22 1711         6 Minute Walk   Phase Initial     Distance 1215 feet     Walk Time 6 minutes     # of Rest Breaks 0     MPH 2.3     METS 3.37     RPE 15     Perceived Dyspnea  3     VO2 Peak 11.81     Symptoms Yes (comment)     Comments Chest pain 6/10 - resolved with rest, SOB     Resting HR 65 bpm     Resting BP 122/72     Resting Oxygen Saturation  97 %     Exercise Oxygen Saturation  during 6 min walk 98 %     Max Ex. HR 111 bpm     Max Ex. BP 154/70     2 Minute Post BP 110/68              Oxygen Initial Assessment:   Oxygen Re-Evaluation:   Oxygen Discharge (Final Oxygen Re-Evaluation):   Initial Exercise Prescription:  Initial Exercise Prescription - 10/07/22 1700       Date of Initial Exercise RX and Referring Provider   Date 10/07/22    Referring Provider Kathlyn Sacramento MD      Treadmill   MPH 1.8    Grade 0    Minutes 15    METs 2.38      Recumbant Bike   Level 1    RPM 60    Watts 30    Minutes 15    METs 3.3      NuStep   Level 1    SPM 80    Minutes 15    METs 3.3      REL-XR   Level 1    Speed 50    Minutes 15    METs 3.3       T5 Nustep   Level 1    SPM 80    Minutes 15    METs 3.3      Prescription Details   Frequency (times per week) 3    Duration Progress to 30 minutes of continuous aerobic without signs/symptoms of physical distress      Intensity   THRR 40-80% of Max Heartrate 105 - 146    Ratings of Perceived Exertion 11-13    Perceived Dyspnea 0-4      Progression   Progression Continue to progress workloads to maintain intensity without signs/symptoms of physical distress.      Resistance Training   Training Prescription Yes    Weight 5 lb    Reps 10-15             Perform Capillary Blood Glucose checks as needed.  Exercise Prescription Changes:   Exercise Prescription Changes     Row Name 10/07/22 1700 10/20/22 0700 10/22/22 1600 11/04/22 1600 11/18/22 1100     Response to Exercise   Blood Pressure (Admit) 122/72 -- 134/72 124/72 122/68   Blood Pressure (Exercise) 154/70 -- 164/74 164/74 136/70   Blood Pressure (Exit) 110/68 -- 148/70 122/62 118/64   Heart Rate (Admit) 65 bpm -- 96 bpm 87 bpm 84 bpm   Heart Rate (Exercise) 111 bpm -- 136 bpm 144 bpm 135 bpm   Heart Rate (Exit) 65 bpm -- 92 bpm 87 bpm 86 bpm   Oxygen Saturation (Admit) 97 % -- -- -- --   Oxygen  Saturation (Exercise) 98 % -- -- -- --   Rating of Perceived Exertion (Exercise) 15 -- 15 15 15   $ Perceived Dyspnea (Exercise) 3 -- -- -- --   Symptoms chest pain 6/10 , resolved with rest; SOB -- 2/10 Chest pain, resolved with rest none none   Comments walk test results -- 4th full day of exercise -- --   Duration -- -- Continue with 30 min of aerobic exercise without signs/symptoms of physical distress. Continue with 30 min of aerobic exercise without signs/symptoms of physical distress. Continue with 30 min of aerobic exercise without signs/symptoms of physical distress.   Intensity -- -- THRR unchanged THRR unchanged THRR unchanged     Progression   Progression -- -- Continue to progress workloads to maintain  intensity without signs/symptoms of physical distress. Continue to progress workloads to maintain intensity without signs/symptoms of physical distress. Continue to progress workloads to maintain intensity without signs/symptoms of physical distress.   Average METs -- -- 2.75 3.39 4.2     Resistance Training   Training Prescription -- -- Yes Yes Yes   Weight -- -- 4 lb 5 lb 7 lb   Reps -- -- 10-15 10-15 10-15     Interval Training   Interval Training -- -- No No No     Treadmill   MPH -- -- 2 2.2 2.7   Grade -- -- 1.5 2 2   $ Minutes -- -- 15 15 15   $ METs -- -- 2.95 3.29 3.81     Recumbant Bike   Level -- -- -- 4 --   Watts -- -- -- 36 --   Minutes -- -- -- 15 --   METs -- -- -- 3 --     NuStep   Level -- -- 3 6 6   $ Minutes -- -- 15 15 15   $ METs -- -- 4 5.1 5.7     T5 Nustep   Level -- -- 4 6 --   Minutes -- -- 15 15 --   METs -- -- 2.1 2.5 --     Biostep-RELP   Level -- -- 2 -- --   Minutes -- -- 15 -- --   METs -- -- 2 -- --     Home Exercise Plan   Plans to continue exercise at -- [employer] (comment)  [employer] (comment)  Field seismologist (comment)  Field seismologist (comment)  [employer]   Frequency -- Add 2 additional days to program exercise sessions. Add 2 additional days to program exercise sessions. Add 2 additional days to program exercise sessions. Add 2 additional days to program exercise sessions.   Initial Home Exercises Provided -- 10/20/22 10/20/22 10/20/22 10/20/22     Oxygen   Maintain Oxygen Saturation -- -- 88% or higher 88% or higher 88% or higher    Row Name 12/02/22 1500             Response to Exercise   Blood Pressure (Admit) 110/60       Blood Pressure (Exit) 132/68       Heart Rate (Admit) 91 bpm       Heart Rate (Exercise) 151 bpm       Heart Rate (Exit) 92 bpm       Rating of Perceived Exertion (Exercise) 15       Symptoms none       Duration Continue with 30 min of aerobic  exercise without signs/symptoms of  physical distress.       Intensity THRR unchanged         Progression   Progression Continue to progress workloads to maintain intensity without signs/symptoms of physical distress.       Average METs 4.58         Resistance Training   Training Prescription Yes       Weight 7 lb       Reps 10-15         Interval Training   Interval Training No         Treadmill   MPH 3       Grade 2       Minutes 15       METs 4.12         NuStep   Level 7       Minutes 15       METs 6         REL-XR   Level 7       Minutes 15         Home Exercise Plan   Plans to continue exercise at [employer] (comment)  [employer]       Frequency Add 2 additional days to program exercise sessions.       Initial Home Exercises Provided 10/20/22         Oxygen   Maintain Oxygen Saturation 88% or higher                Exercise Comments:   Exercise Comments     Row Name 10/12/22 0757           Exercise Comments First full day of exercise!  Patient was oriented to gym and equipment including functions, settings, policies, and procedures.  Patient's individual exercise prescription and treatment plan were reviewed.  All starting workloads were established based on the results of the 6 minute walk test done at initial orientation visit.  The plan for exercise progression was also introduced and progression will be customized based on patient's performance and goals.                Exercise Goals and Review:   Exercise Goals     Row Name 10/07/22 1717             Exercise Goals   Increase Physical Activity Yes       Intervention Develop an individualized exercise prescription for aerobic and resistive training based on initial evaluation findings, risk stratification, comorbidities and participant's personal goals.;Provide advice, education, support and counseling about physical activity/exercise needs.       Expected Outcomes Short Term:  Attend rehab on a regular basis to increase amount of physical activity.;Long Term: Add in home exercise to make exercise part of routine and to increase amount of physical activity.;Long Term: Exercising regularly at least 3-5 days a week.       Increase Strength and Stamina Yes       Intervention Provide advice, education, support and counseling about physical activity/exercise needs.;Develop an individualized exercise prescription for aerobic and resistive training based on initial evaluation findings, risk stratification, comorbidities and participant's personal goals.       Expected Outcomes Short Term: Increase workloads from initial exercise prescription for resistance, speed, and METs.;Short Term: Perform resistance training exercises routinely during rehab and add in resistance training at home;Long Term: Improve cardiorespiratory fitness, muscular endurance and strength as measured by increased METs and functional capacity (6MWT)  Able to understand and use rate of perceived exertion (RPE) scale Yes       Intervention Provide education and explanation on how to use RPE scale       Expected Outcomes Short Term: Able to use RPE daily in rehab to express subjective intensity level;Long Term:  Able to use RPE to guide intensity level when exercising independently       Able to understand and use Dyspnea scale Yes       Intervention Provide education and explanation on how to use Dyspnea scale       Expected Outcomes Short Term: Able to use Dyspnea scale daily in rehab to express subjective sense of shortness of breath during exertion;Long Term: Able to use Dyspnea scale to guide intensity level when exercising independently       Knowledge and understanding of Target Heart Rate Range (THRR) Yes       Intervention Provide education and explanation of THRR including how the numbers were predicted and where they are located for reference       Expected Outcomes Short Term: Able to state/look up  THRR;Long Term: Able to use THRR to govern intensity when exercising independently;Short Term: Able to use daily as guideline for intensity in rehab       Able to check pulse independently Yes       Intervention Provide education and demonstration on how to check pulse in carotid and radial arteries.;Review the importance of being able to check your own pulse for safety during independent exercise       Expected Outcomes Long Term: Able to check pulse independently and accurately;Short Term: Able to explain why pulse checking is important during independent exercise       Understanding of Exercise Prescription Yes       Intervention Provide education, explanation, and written materials on patient's individual exercise prescription       Expected Outcomes Short Term: Able to explain program exercise prescription;Long Term: Able to explain home exercise prescription to exercise independently                Exercise Goals Re-Evaluation :  Exercise Goals Re-Evaluation     Row Name 10/12/22 0757 10/20/22 0754 10/22/22 1650 10/29/22 0823 11/04/22 1624     Exercise Goal Re-Evaluation   Exercise Goals Review Able to understand and use rate of perceived exertion (RPE) scale;Able to understand and use Dyspnea scale;Knowledge and understanding of Target Heart Rate Range (THRR);Understanding of Exercise Prescription Able to understand and use rate of perceived exertion (RPE) scale;Able to understand and use Dyspnea scale;Knowledge and understanding of Target Heart Rate Range (THRR);Understanding of Exercise Prescription;Able to check pulse independently;Increase Strength and Stamina;Increase Physical Activity Increase Physical Activity;Increase Strength and Stamina;Understanding of Exercise Prescription Increase Physical Activity;Increase Strength and Stamina;Understanding of Exercise Prescription Increase Physical Activity;Increase Strength and Stamina;Understanding of Exercise Prescription   Comments  Reviewed RPE scale, THR and program prescription with pt today.  Pt voiced understanding and was given a copy of goals to take home. Reviewed home exercise with pt today.  Pt plans to walk and join her daughter at [facility] for exercise.  She also has equipment in her basement.  Reviewed THR, pulse, RPE, sign and symptoms, pulse oximetery and when to call 911 or MD.  Also discussed weather considerations and indoor options.  Pt voiced understanding. Andrea [name] is doing well for the first couple of weeks she has been here. She has increased all her workloads so far, specifically up  to level 3 and 4 on both the T4 and T5 Nustep, respectively. She has also increased to 2.0 mph/ 1.5% incline  on the treadmill. She still has had some mild chest pain, typically 2/10 and resolves easily with rest. Her doctors are still aware and working on medication changes. We will continue to monitor. Patient plans to walk at home and attend the gym for her exercise. Reviewed THR and encouraged to watch her HR when exercising. Also encouraged to incorporate some type of resistance training while at the gym, either using free weight or weight training. Patient plans to continue to exercise outside of rehab to equate 3-5 days total/week. Andrea [name] is doing well in rehab. She recently increased her overall MET level to 3.39 METs. She also improved her workloads on her seated machines, up to level 6 on the T4 and T5, and level 4 on the recumbent bike. She also increased her workload on the treadmill to a speed of 2.2 mph and an incline of 2%. We will continue to monitor her progress in the program.   Expected Outcomes Short: Use RPE daily to regulate intensity. Long: Follow program prescription in THR. Short: Start to go to gym with daughter Long: conitnue to improve stamina Short: Continue to increase workloads in relevance to chest pain symptoms Long: Continue to increase overall MET level ShorT: Start checking HR during exercise Long: Continue  to execise independently at home Short: Continue to increase workloads. Long: Continue to improve strength and stamina.    [place] Name 11/18/22 1146 11/23/22 0738 12/02/22 1549         Exercise Goal Re-Evaluation   Exercise Goals Review Increase Physical Activity;Increase Strength and Stamina;Understanding of Exercise Prescription Increase Physical Activity;Increase Strength and Stamina;Understanding of Exercise Prescription Increase Physical Activity;Increase Strength and Stamina;Understanding of Exercise Prescription     Comments Andrea [name] continues to do well in rehab. She was out for several sessions due to having covid, but resumed back this week. She was able to increase her treadmill workload to 2.7 speed with a 2% incline. She is also now using 7 lbs for handweights. She continues to reach her THR. Will continue to monitor. Andrea [name] reports that she comes to cardiac rehab consistently and occationally exercises at a community gym with her daughter in law. She plans to join that gym so that she has somewhere to exercise after graduation from cardiac rehab. She reports that she no longer is having cardiac symptoms and feels like she has more energy. Andrea [name] is doing well in rehab. She recently increased her overall average MET level to 4.58 METs. She also improved to level 7 on the XR machine. She increased her treadmill workload as well, to a speed of 3 mph and an incline of 2%. We will continue to monitor her progress in the program.     Expected Outcomes Short: Continue to increase treadmill incline Long: Continue to increase overall MET level and stamina Short: join community gym with daughter in law abd add 1-2 days of consistent exercise outside of rehab. Long: become independent with exercise routine. Short: Continue to increase treadmill workload. Long: Continue to improve strength and stamina.              Discharge Exercise Prescription (Final Exercise Prescription Changes):  Exercise  Prescription Changes - 12/02/22 1500       Response to Exercise   Blood Pressure (Admit) 110/60    Blood Pressure (Exit) 132/68    Heart Rate (Admit) 91 bpm  Heart Rate (Exercise) 151 bpm    Heart Rate (Exit) 92 bpm    Rating of Perceived Exertion (Exercise) 15    Symptoms none    Duration Continue with 30 min of aerobic exercise without signs/symptoms of physical distress.    Intensity THRR unchanged      Progression   Progression Continue to progress workloads to maintain intensity without signs/symptoms of physical distress.    Average METs 4.58      Resistance Training   Training Prescription Yes    Weight 7 lb    Reps 10-15      Interval Training   Interval Training No      Treadmill   MPH 3    Grade 2    Minutes 15    METs 4.12      NuStep   Level 7    Minutes 15    METs 6      REL-XR   Level 7    Minutes 15      Home Exercise Plan   Plans to continue exercise at [employer] (comment)   [employer]   Frequency Add 2 additional days to program exercise sessions.    Initial Home Exercises Provided 10/20/22      Oxygen   Maintain Oxygen Saturation 88% or higher             Nutrition:  Target Goals: Understanding of nutrition guidelines, daily intake of sodium <1551m, cholesterol <206m calories 30% from fat and 7% or less from saturated fats, daily to have 5 or more servings of fruits and vegetables.  Education: All About Nutrition: -Group instruction provided by verbal, written material, interactive activities, discussions, models, and posters to present general guidelines for heart healthy nutrition including fat, fiber, MyPlate, the role of sodium in heart healthy nutrition, utilization of the nutrition label, and utilization of this knowledge for meal planning. Follow up email sent as well. Written material given at graduation. [facility]  Education need identified  10/07/22       Biometrics:  Pre Biometrics - 10/07/22 1712       Pre Biometrics   Height 5' 8.3" (1.735 m)    Weight 250 lb 14.4 oz (113.8 kg)    Waist Circumference 54 inches    Hip Circumference 53.5 inches    Waist to Hip Ratio 1.01 %    BMI (Calculated) 37.81    Single Leg Stand 30 seconds              Nutrition Therapy Plan and Nutrition Goals:  Nutrition Therapy & Goals - 10/07/22 1722       Nutrition Therapy   Diet Heart healthy, low Na    Protein (specify units) 90-100g    Fiber 25 grams    Whole Grain Foods 3 servings    Saturated Fats 12 max. grams    Fruits and Vegetables 8 servings/day    Sodium 2 grams      Personal Nutrition Goals   Nutrition Goal ST: review handouts. fill up a large cup when she makes her coffee and then set alerts to remind her to drink. Try hidden vegetable recipes. LT: include at least 5 fruit/vegetable servings/day, include whole grains at least 1x/day, drink water throughout the day    Comments [age].[id number]. F admitted for cardiac rehab. PMHx includes HTN, HLD, anxiety/depression, diverticulitis, GERD. Reviewed relevant medications and recent lab work. Andrea [name] reports that her  biggest goals are inlcuding more fruits/vegetables and drinking enough fluids (specifically water). She has hemorrhoids and reports that when she does eat higher fiber food like whole wheat bread and peanut butter, she will be constipated - discussed how increasing fiber without increasing water can cause/worsen constipation. Reviewed diverticulitis MNT - discussed that high fiber foods can help to prevent flare ups. Andrea [name] reports that she has to foce herself to drink fluids during the day, she likes water, but has a hard time drinking it; she has tried to keep her water close to her and use a straw, but they have not helped - suggested that she could fill up a large cup when she makes her coffee and then set alerts to remind her to drink. Andrea [name] reports that she does not like  most vegetables and has a hard time including them - discussed hidden vegetables and search terms to find hidden vegetable recipes - provided handouts. Reviewed heart healthy eating.      Intervention Plan   Intervention Prescribe, educate and counsel regarding individualized specific dietary modifications aiming towards targeted core components such as weight, hypertension, lipid management, diabetes, heart failure and other comorbidities.;Nutrition handout(s) given to patient.    Expected Outcomes Short Term Goal: Understand basic principles of dietary content, such as calories, fat, sodium, cholesterol and nutrients.;Short Term Goal: A plan has been developed with personal nutrition goals set during dietitian appointment.;Long Term Goal: Adherence to prescribed nutrition plan.             Nutrition Assessments:  MEDIFICTS Score Key: ?70 Need to make dietary changes  40-70 Heart Healthy Diet ? 40 Therapeutic Level Cholesterol Diet  Flowsheet Row Cardiac Rehab from 10/07/2022 in [facility] Cardiac and Pulmonary Rehab  Picture Your Plate Total Score on Admission 32      Picture Your Plate Scores: [id number] Unhealthy dietary pattern with much room for improvement. 41-50 Dietary pattern unlikely to meet recommendations for good health and room for improvement. 51-60 More healthful dietary pattern, with some room for improvement.  >60 Healthy dietary pattern, although there may be some specific behaviors that could be improved.    Nutrition Goals Re-Evaluation:  Nutrition Goals Re-Evaluation     Row Name 10/29/22 0729 11/23/22 0746           Goals   Nutrition Goal ST: review handouts. fill up a large cup when she makes her coffee and then set alerts to remind her to drink. Try hidden vegetable recipes. LT: include at least 5 fruit/vegetable servings/day, include whole grains at least 1x/day, drink water throughout the day --      Comment Andrea [name] admits she has not made many changes regarding  her diet just yet but is motivated to make this her next change. She really struggles with her intake of water and may get a water bottle to mark it with amount of ozs and keep track throughout the day and make hourly goals. She really wants to start meal prepping with her husband for portion control. She also is going to try to work on reducing her sodium intake as she knows she eats too much but hasn't looked at the labels to see how much. Andrea [name] stated that nutrition is still the area she has not started working on as much. We discussed trying to make smaller changes to help her get motivated to start working on nutrition. She stated she will try to cut out Dr. Malachi Bonds and start drinking water, and to start tracking calories on  her track it app to be more aware of what she is eating.      Expected Outcome Short: Work on RD recommendations, focus on drinking more water Long: Continue to eat heart healthy exercise Short: replace Dr. Malachi Bonds with water and start using track it app to monitor food intake. Long: adapt to independently eating a heart healthy diet.               Nutrition Goals Discharge (Final Nutrition Goals Re-Evaluation):  Nutrition Goals Re-Evaluation - 11/23/22 0746       Goals   Comment Andrea [name] stated that nutrition is still the area she has not started working on as much. We discussed trying to make smaller changes to help her get motivated to start working on nutrition. She stated she will try to cut out Dr. Malachi Bonds and start drinking water, and to start tracking calories on her track it app to be more aware of what she is eating.    Expected Outcome Short: replace Dr. Malachi Bonds with water and start using track it app to monitor food intake. Long: adapt to independently eating a heart healthy diet.             Psychosocial: Target Goals: Acknowledge presence or absence of significant depression and/or stress, maximize coping skills, provide positive support system. Participant is  able to verbalize types and ability to use techniques and skills needed for reducing stress and depression.   Education: Stress, Anxiety, and Depression - Group verbal and visual presentation to define topics covered.  Reviews how body is impacted by stress, anxiety, and depression.  Also discusses healthy ways to reduce stress and to treat/manage anxiety and depression.  Written material given at graduation. Flowsheet Row Cardiac Rehab from 12/10/2022 in [facility] Cardiac and Pulmonary Rehab  Date 10/29/22  Educator [facility]  Instruction Review Code 1- [employer] Understanding       Education: Sleep Hygiene -Provides group verbal and written instruction about how sleep can affect your health.  Define sleep hygiene, discuss sleep cycles and impact of sleep habits. Review good sleep hygiene tips.    Initial Review & Psychosocial Screening:  Initial Psych Review & Screening - 10/06/22 1444       Initial Review   Current issues with Current Sleep Concerns;Current Stress Concerns    Source of Stress Concerns Unable to participate in former interests or hobbies;Unable to perform yard/household activities      [place]? Yes   family     Barriers   Psychosocial barriers to participate in program There are no identifiable barriers or psychosocial needs.;The patient should benefit from training in stress management and relaxation.      Screening Interventions   Interventions Encouraged to exercise;Provide feedback about the scores to participant;To provide support and resources with identified psychosocial needs    Expected Outcomes Short Term goal: Utilizing psychosocial counselor, staff and physician to assist with identification of specific Stressors or current issues interfering with healing process. Setting desired goal for each stressor or current issue identified.;Long Term Goal: Stressors or current issues are controlled or eliminated.;Short Term goal: Identification  and review with participant of any Quality of Life or Depression concerns found by scoring the questionnaire.;Long Term goal: The participant improves quality of Life and PHQ9 Scores as seen by post scores and/or verbalization of changes             Quality of Life Scores:   Quality of Life - 10/07/22 1631  Quality of Life   Select Quality of Life      Quality of Life Scores   Health/Function Pre 12.33 %    Socioeconomic Pre 24.25 %    Psych/Spiritual Pre 14.71 %    Family Pre 22.8 %    GLOBAL Pre 17.03 %            Scores of 19 and below usually indicate a poorer quality of life in these areas.  A difference of  2-3 points is a clinically meaningful difference.  A difference of 2-3 points in the total score of the Quality of Life Index has been associated with significant improvement in overall quality of life, self-image, physical symptoms, and general health in studies assessing change in quality of life.  PHQ-9: Review Flowsheet       10/07/2022  Depression screen PHQ 2/9  Decreased Interest 1  Down, Depressed, Hopeless 1  PHQ - 2 Score 2  Altered sleeping 1  Tired, decreased energy 3  Change in appetite 1  Feeling bad or failure about yourself  0  Trouble concentrating 0  Moving slowly or fidgety/restless 0  Suicidal thoughts 0  PHQ-9 Score 7  Difficult doing work/chores Somewhat difficult   Interpretation of Total Score  Total Score Depression Severity:  1-4 = Minimal depression, 5-9 = Mild depression, 10-14 = Moderate depression, 15-19 = Moderately severe depression, 20-27 = Severe depression   Psychosocial Evaluation and Intervention:  Psychosocial Evaluation - 10/06/22 1456       Psychosocial Evaluation & Interventions   Interventions Encouraged to exercise with the program and follow exercise prescription;Relaxation education;Stress management education    Comments Andrea [name] is coming to 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 She is still having some chest  pain, which her doctors are aware. She has new medication since the MI that she is adjusting to. Since her MI, she has been having headaches which she doesn't know if its medication related to due to possible anemia. She has a long GI history, including diverticulitis and hemorrhoids. When asked about stress, anxiety, and depression, she admits that she has been feeling all those things since her MI last week She is more teary and feels like she can't keep up. She knows its temporary, but stated it hurts because she can't follow her two year old granddaughter around like she wants. She is very motivated to come to the program to start an exercise routine and to get help with nutrition. She is very proud that she quit smoking 06/25/22 cold [place] and has no desire to start back.    Expected Outcomes Short: attend cardiac rehab for education and exercise. Long: develop and maintain positive self care habits.    Continue Psychosocial Services  Follow up required by staff             Psychosocial Re-Evaluation:  Psychosocial Re-Evaluation     Row Name 10/29/22 0735 11/23/22 0800           Psychosocial Re-Evaluation   Current issues with Current Stress Concerns;Current Sleep Concerns Current Stress Concerns;Current Sleep Concerns      Comments Andrea [name] states she is doing well mentally. She definitely experiences some stress through work but states she "thrives" on stress at work as it keeps her motivated. She did state that she feels she manages it well and not to the point where it is unbearable. She states she is not sleeping good, as she wakes up a lot during the night but can fall asleep  quick. She hasn't contacted her doctor yet but admits she is on her phone a lot before bed. She was encouraged to contact her PCP to see if there is anything she is able to take and to also ask about a possible sleep study if warranted. She is enjoying the program so far but is frustrated she is still dealing with  mild chest pain and hasn't had answers yet from a doctor as to what is causing it. They are still in the process of changing her medications. Andrea [name] reports that he main stress is health concerns that she does not have definate answers for. She has an appointment with her cardiologist today and has a list of questions to discuss with the doctor. Her main stress is worry over what to look for in the future with reguards to symptoms coming back and being able to identify if she is having another heart event or medical emergency. She states she sleeps 6-7 hours a night.      Expected Outcomes Short: Ask doctor about any possible sleep aids Long: Continue to maintain positive attitude Short: talk to doctor about symptom identification concersn. Long: continue good mental health habits.      Interventions Encouraged to attend Cardiac Rehabilitation for the exercise Encouraged to attend Cardiac Rehabilitation for the exercise      Continue Psychosocial Services  Follow up required by staff Follow up required by staff               Psychosocial Discharge (Final Psychosocial Re-Evaluation):  Psychosocial Re-Evaluation - 11/23/22 0800       Psychosocial Re-Evaluation   Current issues with Current Stress Concerns;Current Sleep Concerns    Comments Andrea [name] reports that he main stress is health concerns that she does not have definate answers for. She has an appointment with her cardiologist today and has a list of questions to discuss with the doctor. Her main stress is worry over what to look for in the future with reguards to symptoms coming back and being able to identify if she is having another heart event or medical emergency. She states she sleeps 6-7 hours a night.    Expected Outcomes Short: talk to doctor about symptom identification concersn. Long: continue good mental health habits.    Interventions Encouraged to attend Cardiac Rehabilitation for the exercise    Continue Psychosocial Services   Follow up required by staff             Vocational Rehabilitation: Provide vocational rehab assistance to qualifying candidates.   Vocational Rehab Evaluation & Intervention:  Vocational Rehab - 10/06/22 1438       Initial Vocational Rehab Evaluation & Intervention   Assessment shows need for Vocational Rehabilitation No             Education: Education Goals: Education classes will be provided on a variety of topics geared toward better understanding of heart health and risk factor modification. Participant will state understanding/return demonstration of topics presented as noted by education test scores.  Learning Barriers/Preferences:  Learning Barriers/Preferences - 10/06/22 1436       Learning Barriers/Preferences   Learning Barriers None    Learning Preferences None             General Cardiac Education Topics:  AED/CPR: - Group verbal and written instruction with the use of models to demonstrate the basic use of the AED with the basic ABC's of resuscitation.   Anatomy and Cardiac Procedures: - Group verbal and visual  presentation and models provide information about basic cardiac anatomy and function. Reviews the testing methods done to diagnose heart disease and the outcomes of the test results. Describes the treatment choices: Medical Management, Angioplasty, or Coronary Bypass Surgery for treating various heart conditions including Myocardial Infarction, Angina, Valve Disease, and Cardiac Arrhythmias.  Written material given at graduation. Flowsheet Row Cardiac Rehab from 12/10/2022 in [facility] Cardiac and Pulmonary Rehab  Date 12/03/22  Educator SB  Instruction Review Code 1- Verbalizes Understanding       Medication Safety: - Group verbal and visual instruction to review commonly prescribed medications for heart and lung disease. Reviews the medication, class of the drug, and side effects. Includes the steps to properly store meds and maintain the  prescription regimen.  Written material given at graduation.   Intimacy: - Group verbal instruction through game format to discuss how heart and lung disease can affect sexual intimacy. Written material given at graduation..   Know Your Numbers and Heart Failure: - Group verbal and visual instruction to discuss disease risk factors for cardiac and pulmonary disease and treatment options.  Reviews associated critical values for Overweight/Obesity, Hypertension, Cholesterol, and Diabetes.  Discusses basics of heart failure: signs/symptoms and treatments.  Introduces Heart Failure Zone chart for action plan for heart failure.  Written material given at graduation. Flowsheet Row Cardiac Rehab from 12/10/2022 in [facility] Cardiac and Pulmonary Rehab  Date 10/15/22  Educator SB  Instruction Review Code 1- Verbalizes Understanding       Infection Prevention: - Provides verbal and written material to individual with discussion of infection control including proper hand washing and proper equipment cleaning during exercise session. Flowsheet Row Cardiac Rehab from 12/10/2022 in [facility] Cardiac and Pulmonary Rehab  Education need identified 10/07/22  Date 10/07/22  Educator [place]  Instruction Review Code 1- Verbalizes Understanding       Falls Prevention: - Provides verbal and written material to individual with discussion of falls prevention and safety. Flowsheet Row Cardiac Rehab from 12/10/2022 in [facility] Cardiac and Pulmonary Rehab  Education need identified 10/07/22  Date 10/07/22  Educator [place]  Instruction Review Code 1- Verbalizes Understanding       Other: -Provides group and verbal instruction on various topics (see comments) Flowsheet Row Cardiac Rehab from 12/10/2022 in [facility] Cardiac and Pulmonary Rehab  Date 12/10/22  Educator SB  Instruction Review Code 1- Verbalizes Understanding       Knowledge Questionnaire Score:  Knowledge Questionnaire Score - 10/07/22 1631       Knowledge  Questionnaire Score   Pre Score 25/26             Core Components/Risk Factors/Patient Goals at Admission:  Personal Goals and Risk Factors at Admission - 10/07/22 1717       Core Components/Risk Factors/Patient Goals on Admission    Weight Management Yes;Weight Loss;Obesity    Intervention Weight Management: Develop a combined nutrition and exercise program designed to reach desired caloric intake, while maintaining appropriate intake of nutrient and fiber, sodium and fats, and appropriate energy expenditure required for the weight goal.;Weight Management: Provide education and appropriate resources to help participant work on and attain dietary goals.;Weight Management/Obesity: Establish reasonable short term and long term weight goals.;Obesity: Provide education and appropriate resources to help participant work on and attain dietary goals.    Admit Weight 250 lb (113.4 kg)    Goal Weight: Short Term 245 lb (111.1 kg)    Goal Weight: Long Term 180 lb (81.6 kg)   Per  patient   Expected Outcomes Short Term: Continue to assess and modify interventions until short term weight is achieved;Long Term: Adherence to nutrition and physical activity/exercise program aimed toward attainment of established weight goal;Weight Loss: Understanding of general recommendations for a balanced deficit meal plan, which promotes 1-2 lb weight loss per week and includes a negative energy balance of [phone] kcal/d;Understanding recommendations for meals to include 15-35% energy as protein, 25-35% energy from fat, 35-60% energy from carbohydrates, less than 259m of dietary cholesterol, 20-35 gm of total fiber daily;Understanding of distribution of calorie intake throughout the day with the consumption of 4-5 meals/snacks    Hypertension Yes    Intervention Monitor prescription use compliance.;Provide education on lifestyle modifcations including regular physical activity/exercise, weight management, moderate sodium  restriction and increased consumption of fresh fruit, vegetables, and low fat dairy, alcohol moderation, and smoking cessation.    Expected Outcomes Short Term: Continued assessment and intervention until BP is < 140/917mHG in hypertensive participants. < 130/8058mG in hypertensive participants with diabetes, heart failure or chronic kidney disease.;Long Term: Maintenance of blood pressure at goal levels.    Lipids Yes    Intervention Provide education and support for participant on nutrition & aerobic/resistive exercise along with prescribed medications to achieve LDL <64m20mDL >40mg85m Expected Outcomes Short Term: Participant states understanding of desired cholesterol values and is compliant with medications prescribed. Participant is following exercise prescription and nutrition guidelines.;Long Term: Cholesterol controlled with medications as prescribed, with individualized exercise RX and with personalized nutrition plan. Value goals: LDL < 64mg,108m > 40 mg.             Education:Diabetes - Individual verbal and written instruction to review signs/symptoms of diabetes, desired ranges of glucose level fasting, after meals and with exercise. Acknowledge that pre and post exercise glucose checks will be done for 3 sessions at entry of program.   Core Components/Risk Factors/Patient Goals Review:   Goals and Risk Factor Review     Row Name 10/29/22 0720 10/29/22 0734 11/23/22 0753         Core Components/Risk Factors/Patient Goals Review   Personal Goals Review Tobacco Cessation Weight Management/Obesity;Hypertension;Lipids Weight Management/Obesity;Hypertension;Lipids     Review Andrea [name] has quit smoking in September and she plans to stay that way. She has not had any tobacco products since she quit. She does not use any medications or suppliments to help her quit smoking. She has read a book that helped her quit and will stay that way. Andrea [name] has a BP at cuff at home to use but usually  will only check it if she feels bad. Encouraged her to check more frequently at home. Her pressures are good at rehab. She checks her weight and has lost 2 lb since starting the program, but trying to lose more once she starts making he diet changes (drinking more water, reducing sodium, portion sizes). She is staying compliant with all of her medications. Her doctor recently made a medication switch to hopefully help relieve some mild chest pain she has been experiencing. Andrea [name] reported that he weight is up again. She is still working on reducing salt and feels like sometimes she has "water weight" after she eats a lot of sodium. She reports that she takes all blood pressure and lipid meds as prescribed and continues to monitor risk factors. She reports monitoring weight at home and does have a BP cuff to check BP when she feels bad, but does not check in regularly.  Expected Outcomes Short: meet quit date in March. Long: stay tobacco free. Short:Continue to work on weight loss Long: continue to manage lifestyle risk factors Short:Continue to work on weight loss  and reduce sodium intake. Check BP at home consistently.  Long: continue to manage lifestyle risk factors.              Core Components/Risk Factors/Patient Goals at Discharge (Final Review):   Goals and Risk Factor Review - 11/23/22 0753       Core Components/Risk Factors/Patient Goals Review   Personal Goals Review Weight Management/Obesity;Hypertension;Lipids    Review Andrea [name] reported that he weight is up again. She is still working on reducing salt and feels like sometimes she has "water weight" after she eats a lot of sodium. She reports that she takes all blood pressure and lipid meds as prescribed and continues to monitor risk factors. She reports monitoring weight at home and does have a BP cuff to check BP when she feels bad, but does not check in regularly.    Expected Outcomes Short:Continue to work on weight loss  and reduce  sodium intake. Check BP at home consistently.  Long: continue to manage lifestyle risk factors.             ITP Comments:  ITP Comments     Row Name 10/06/22 1502 10/07/22 1628 10/07/22 1718 10/07/22 1722 10/12/22 0756   ITP Comments Initial phone call completed. Diagnosis can be found in [facility] 12/6. EP Orientation scheduled for Wednesday 12/13 at 2:30. Completed 6MWT and gym orientation. Initial ITP created and sent for review to Dr. Emily Filbert, Medical Director. Andrea [name] has recently quit tobacco use within the last 6 months. Her quite date was 06/26/22. Intervention for relapse prevention was provided at the initial medical review. He was encouraged to continue to with tobacco cessation and was provided information on relapse prevention. Patient received information about combination therapy, tobacco cessation classes, quit line, and quit smoking apps in case of a relapse. Patient demonstrated understanding of this material.Staff will continue to provide encouragement and follow up with the patient throughout the program. Patient was advised to follow up with Dr. Fletcher Anon first before returning to rehab. Per D/C notes, patient was supposed to have a hospital follow up. Patient is experiencing recurrent chest pain during activity. Small EKG changes noted today after 6MWT. Chest pain resolves at rest. EKG strips sent over to Dr. Fletcher Anon for review and will receive clearance for patient to return. Staff discussed with patient when to call 911. First full day of exercise!  Patient was oriented to gym and equipment including functions, settings, policies, and procedures.  Patient's individual exercise prescription and treatment plan were reviewed.  All starting workloads were established based on the results of the 6 minute walk test done at initial orientation visit.  The plan for exercise progression was also introduced and progression will be customized based on patient's performance and goals.    [place] Name  10/21/22 1007 10/29/22 0718 11/18/22 0915 12/16/22 1233     ITP Comments 30 Day review completed. Medical Director ITP review done, changes made as directed, and signed approval by Medical Director.     new to program Andrea [name] has quit smoking in September and she plans to stay that way. She has not had any tobacco products since she quit. She does not use any medications or suppliments to help her quit smoking. She has read a book that helped her quit and will stay that way.  30 Day review completed. Medical Director ITP review done, changes made as directed, and signed approval by Medical Director. 30 day review completed. ITP sent to Dr. Emily Filbert, Medical Director of Cardiac Rehab. Continue with ITP unless changes are made by physician.             Comments: 30 day review

## 2022-12-17 ENCOUNTER — Encounter: Payer: 59 | Admitting: *Deleted

## 2022-12-17 VITALS — Ht 68.3 in | Wt 255.4 lb

## 2022-12-17 DIAGNOSIS — I252 Old myocardial infarction: Secondary | ICD-10-CM | POA: Diagnosis not present

## 2022-12-17 DIAGNOSIS — I214 Non-ST elevation (NSTEMI) myocardial infarction: Secondary | ICD-10-CM

## 2022-12-17 NOTE — Progress Notes (Signed)
Daily Session Note  Patient Details  Name: Andrea [name] MRN: [id number] Date of Birth: [birth year] Referring Provider:   Flowsheet Row Cardiac Rehab from 10/07/2022 in [facility] Cardiac and Pulmonary Rehab  Referring Provider Kathlyn Sacramento MD       Encounter Date: 12/17/2022  Check In:  Session Check In - 12/17/22 0717       Check-In   Supervising physician immediately available to respond to emergencies See telemetry face sheet for immediately available ER MD    Location [facility]-Cardiac & Pulmonary Rehab    Staff Present Darlyne Russian, RN, ADN;Joseph Tessie Fass, RCP,RRT,BSRT;Jessica [place], MA, RCEP, CCRP, Bertram Gala, MS, ACSM CEP, Exercise Physiologist    Virtual Visit No    Medication changes reported     No    Fall or balance concerns reported    No    Tobacco Cessation No Change    Warm-up and Cool-down Performed on first and last piece of equipment    Resistance Training Performed Yes    VAD Patient? No    PAD/SET Patient? No      Pain Assessment   Currently in Pain? No/denies                Social History   Tobacco Use  Smoking Status Former   Packs/day: 0.75   Years: 35.00   Total pack years: 26.25   Types: Cigarettes   Quit date: 06/25/2022   Years since quitting: 0.4  Smokeless Tobacco Never    Goals Met:  Independence with exercise equipment Exercise tolerated well No report of concerns or symptoms today Strength training completed today  Goals Unmet:  Not Applicable  Comments: Pt able to follow exercise prescription today without complaint.  Will continue to monitor for progression.   [place] Name 10/07/22 1711 12/17/22 0852       6 Minute Walk   Phase Initial Discharge    Distance 1215 feet 1623 feet    Distance % Change -- 33.58 %    Distance Feet Change -- 408 ft    Walk Time 6 minutes 6 minutes    # of Rest Breaks 0 0    MPH 2.3 3.07    METS 3.37 5.14    RPE 15 14    Perceived Dyspnea  3 --    VO2 Peak  11.81 17.98    Symptoms Yes (comment) No    Comments Chest pain 6/10 - resolved with rest, SOB --    Resting HR 65 bpm 100 bpm    Resting BP 122/72 134/70    Resting Oxygen Saturation  97 % 96 %    Exercise Oxygen Saturation  during 6 min walk 98 % 95 %    Max Ex. HR 111 bpm 139 bpm    Max Ex. BP 154/70 234/84    2 Minute Post BP 110/68 172/74              Dr. Emily Filbert is Medical Director for [place].  Dr. Ottie Glazier is Medical Director for [facility] Pulmonary Rehabilitation.

## 2022-12-17 NOTE — Patient Instructions (Signed)
Discharge Patient Instructions  Patient Details  Name: Andrea [name] MRN: [id number] Date of Birth: 1971/07/18 Referring Provider:  Jilda Panda, MD   Number of Visits: 36  Reason for Discharge:  Patient reached a stable level of exercise. Patient independent in their exercise. Patient has met program and personal goals.  Smoking History:  Social History   Tobacco Use  Smoking Status Former   Packs/day: 0.75   Years: 35.00   Total pack years: 26.25   Types: Cigarettes   Quit date: 06/25/2022   Years since quitting: 0.4  Smokeless Tobacco Never    Diagnosis:  NSTEMI (non-ST elevated myocardial infarction) Andrea [name])  Initial Exercise Prescription:  Initial Exercise Prescription - 10/07/22 1700       Date of Initial Exercise RX and Referring Provider   Date 10/07/22    Referring Provider Andrea Sacramento MD      Treadmill   MPH 1.8    Grade 0    Minutes 15    METs 2.38      Recumbant Bike   Level 1    RPM 60    Watts 30    Minutes 15    METs 3.3      NuStep   Level 1    SPM 80    Minutes 15    METs 3.3      REL-XR   Level 1    Speed 50    Minutes 15    METs 3.3      T5 Nustep   Level 1    SPM 80    Minutes 15    METs 3.3      Prescription Details   Frequency (times per week) 3    Duration Progress to 30 minutes of continuous aerobic without signs/symptoms of physical distress      Intensity   THRR 40-80% of Max Heartrate 105 - 146    Ratings of Perceived Exertion 11-13    Perceived Dyspnea 0-4      Progression   Progression Continue to progress workloads to maintain intensity without signs/symptoms of physical distress.      Resistance Training   Training Prescription Yes    Weight 5 lb    Reps 10-15             Discharge Exercise Prescription (Final Exercise Prescription Changes):  Exercise Prescription Changes - 12/16/22 1300       Response to Exercise   Blood Pressure (Admit) 126/60    Blood Pressure (Exit) 134/72     Heart Rate (Admit) 88 bpm    Heart Rate (Exercise) 132 bpm    Heart Rate (Exit) 104 bpm    Rating of Perceived Exertion (Exercise) 15    Symptoms none    Duration Continue with 30 min of aerobic exercise without signs/symptoms of physical distress.    Intensity THRR unchanged      Progression   Progression Continue to progress workloads to maintain intensity without signs/symptoms of physical distress.    Average METs 4.6      Resistance Training   Training Prescription Yes    Weight 8 lb    Reps 10-15      Interval Training   Interval Training No      Treadmill   MPH 3    Grade 2    Minutes 15    METs 4.12      NuStep   Level 7    Minutes 15    METs 4.7  REL-XR   Level 6    Minutes 15      Home Exercise Plan   Plans to continue exercise at [facility] (comment)   [employer]   Frequency Add 2 additional days to program exercise sessions.    Initial Home Exercises Provided 10/20/22      Oxygen   Maintain Oxygen Saturation 88% or higher             Functional Capacity:  6 Minute Walk     Row Name 10/07/22 1711 12/17/22 0852       6 Minute Walk   Phase Initial Discharge    Distance 1215 feet 1623 feet    Distance % Change -- 33.58 %    Distance Feet Change -- 408 ft    Walk Time 6 minutes 6 minutes    # of Rest Breaks 0 0    MPH 2.3 3.07    METS 3.37 5.14    RPE 15 14    Perceived Dyspnea  3 --    VO2 Peak 11.81 17.98    Symptoms Yes (comment) No    Comments Chest pain 6/10 - resolved with rest, SOB --    Resting HR 65 bpm 100 bpm    Resting BP 122/72 134/70    Resting Oxygen Saturation  97 % 96 %    Exercise Oxygen Saturation  during 6 min walk 98 % 95 %    Max Ex. HR 111 bpm 139 bpm    Max Ex. BP 154/70 234/84    2 Minute Post BP 110/68 172/74            Nutrition & Weight - Outcomes:  Pre Biometrics - 10/07/22 1712       Pre Biometrics   Height 5' 8.3" (1.735 m)    Weight 250 lb 14.4 oz (113.8 kg)    Waist  Circumference 54 inches    Hip Circumference 53.5 inches    Waist to Hip Ratio 1.01 %    BMI (Calculated) 37.81    Single Leg Stand 30 seconds             Post Biometrics - 12/17/22 0853        Post  Biometrics   Height 5' 8.3" (1.735 m)    Weight 255 lb 6.4 oz (115.8 kg)    Waist Circumference 43.5 inches    Hip Circumference 53.5 inches    Waist to Hip Ratio 0.81 %    BMI (Calculated) 38.49    Single Leg Stand 30 seconds             Nutrition:  Nutrition Therapy & Goals - 10/07/22 1722       Nutrition Therapy   Diet Heart healthy, low Na    Protein (specify units) 90-100g    Fiber 25 grams    Whole Grain Foods 3 servings    Saturated Fats 12 max. grams    Fruits and Vegetables 8 servings/day    Sodium 2 grams      Personal Nutrition Goals   Nutrition Goal ST: review handouts. fill up a large cup when she makes her coffee and then set alerts to remind her to drink. Try hidden vegetable recipes. LT: include at least 5 fruit/vegetable servings/day, include whole grains at least 1x/day, drink water throughout the day    Comments 52 y.o. F admitted for cardiac rehab. PMHx includes HTN, HLD, anxiety/depression, diverticulitis, GERD. Reviewed relevant medications and recent lab work. Andrea [name]  reports that her biggest goals are inlcuding more fruits/vegetables and drinking enough fluids (specifically water). She has hemorrhoids and reports that when she does eat higher fiber food like whole wheat bread and peanut butter, she will be constipated - discussed how increasing fiber without increasing water can cause/worsen constipation. Reviewed diverticulitis MNT - discussed that high fiber foods can help to prevent flare ups. Andrea [name] reports that she has to foce herself to drink fluids during the day, she likes water, but has a hard time drinking it; she has tried to keep her water close to her and use a straw, but they have not helped - suggested that she could fill up a large cup when  she makes her coffee and then set alerts to remind her to drink. Andrea [name] reports that she does not like most vegetables and has a hard time including them - discussed hidden vegetables and search terms to find hidden vegetable recipes - provided handouts. Reviewed heart healthy eating.      Intervention Plan   Intervention Prescribe, educate and counsel regarding individualized specific dietary modifications aiming towards targeted core components such as weight, hypertension, lipid management, diabetes, heart failure and other comorbidities.;Nutrition handout(s) given to patient.    Expected Outcomes Short Term Goal: Understand basic principles of dietary content, such as calories, fat, sodium, cholesterol and nutrients.;Short Term Goal: A plan has been developed with personal nutrition goals set during dietitian appointment.;Long Term Goal: Adherence to prescribed nutrition plan.           Goals reviewed with patient; copy given to patient.

## 2022-12-21 ENCOUNTER — Ambulatory Visit (INDEPENDENT_AMBULATORY_CARE_PROVIDER_SITE_OTHER): Payer: 59 | Admitting: Dermatology

## 2022-12-21 ENCOUNTER — Encounter: Payer: 59 | Admitting: *Deleted

## 2022-12-21 VITALS — BP 172/91 | HR 73

## 2022-12-21 DIAGNOSIS — L578 Other skin changes due to chronic exposure to nonionizing radiation: Secondary | ICD-10-CM

## 2022-12-21 DIAGNOSIS — L820 Inflamed seborrheic keratosis: Secondary | ICD-10-CM

## 2022-12-21 DIAGNOSIS — L814 Other melanin hyperpigmentation: Secondary | ICD-10-CM

## 2022-12-21 DIAGNOSIS — L720 Epidermal cyst: Secondary | ICD-10-CM | POA: Diagnosis not present

## 2022-12-21 DIAGNOSIS — L918 Other hypertrophic disorders of the skin: Secondary | ICD-10-CM

## 2022-12-21 DIAGNOSIS — L309 Dermatitis, unspecified: Secondary | ICD-10-CM | POA: Diagnosis not present

## 2022-12-21 DIAGNOSIS — Z1283 Encounter for screening for malignant neoplasm of skin: Secondary | ICD-10-CM | POA: Diagnosis not present

## 2022-12-21 DIAGNOSIS — I214 Non-ST elevation (NSTEMI) myocardial infarction: Secondary | ICD-10-CM

## 2022-12-21 DIAGNOSIS — I252 Old myocardial infarction: Secondary | ICD-10-CM | POA: Diagnosis not present

## 2022-12-21 DIAGNOSIS — Z86018 Personal history of other benign neoplasm: Secondary | ICD-10-CM

## 2022-12-21 DIAGNOSIS — D229 Melanocytic nevi, unspecified: Secondary | ICD-10-CM

## 2022-12-21 DIAGNOSIS — L821 Other seborrheic keratosis: Secondary | ICD-10-CM

## 2022-12-21 NOTE — Patient Instructions (Signed)
Due to recent changes in healthcare laws, you may see results of your pathology and/or laboratory studies on MyChart before the doctors have had a chance to review them. We understand that in some cases there may be results that are confusing or concerning to you. Please understand that not all results are received at the same time and often the doctors may need to interpret multiple results in order to provide you with the best plan of care or course of treatment. Therefore, we ask that you please give us 2 business days to thoroughly review all your results before contacting the office for clarification. Should we see a critical lab result, you will be contacted sooner.   If You Need Anything After Your Visit  If you have any questions or concerns for your doctor, please call our main line at [phone] and press option 4 to reach your doctor's medical assistant. If no one answers, please leave a voicemail as directed and we will return your call as soon as possible. Messages left after 4 pm will be answered the following business day.   You may also send us a message via MyChart. We typically respond to MyChart messages within 1-2 business days.  For prescription refills, please ask your pharmacy to contact our office. Our fax number is [phone].  If you have an urgent issue when the clinic is closed that cannot wait until the next business day, you can page your doctor at the number below.    Please note that while we do our best to be available for urgent issues outside of office hours, we are not available 24/7.   If you have an urgent issue and are unable to reach us, you may choose to seek medical care at your doctor's office, retail clinic, urgent care center, or emergency room.  If you have a medical emergency, please immediately call 911 or go to the emergency department.  Pager Numbers  - Dr. Kowalski: [phone]  - Dr. Moye: [phone]  - Dr. Stewart:  [phone]  In the event of inclement weather, please call our main line at [phone] for an update on the status of any delays or closures.  Dermatology Medication Tips: Please keep the boxes that topical medications come in in order to help keep track of the instructions about where and how to use these. Pharmacies typically print the medication instructions only on the boxes and not directly on the medication tubes.   If your medication is too expensive, please contact our office at [phone] option 4 or send us a message through MyChart.   We are unable to tell what your co-pay for medications will be in advance as this is different depending on your insurance coverage. However, we may be able to find a substitute medication at lower cost or fill out paperwork to get insurance to cover a needed medication.   If a prior authorization is required to get your medication covered by your insurance company, please allow us 1-2 business days to complete this process.  Drug prices often vary depending on where the prescription is filled and some pharmacies may offer cheaper prices.  The website www.goodrx.com contains coupons for medications through different pharmacies. The prices here do not account for what the cost may be with help from insurance (it may be cheaper with your insurance), but the website can give you the price if you did not use any insurance.  - You can print the associated coupon and take it with   your prescription to the pharmacy.  - You may also stop by our office during regular business hours and pick up a GoodRx coupon card.  - If you need your prescription sent electronically to a different pharmacy, notify our office through [place] MyChart or by phone at [phone] option 4.     Si Usted Necesita Algo Despus de Su Visita  Tambin puede enviarnos un mensaje a travs de MyChart. Por lo general respondemos a los mensajes de MyChart en el transcurso de 1 a 2  das hbiles.  Para renovar recetas, por favor pida a su farmacia que se ponga en contacto con nuestra oficina. Nuestro nmero de fax es el [phone].  Si tiene un asunto urgente cuando la clnica est cerrada y que no puede esperar hasta el siguiente da hbil, puede llamar/localizar a su doctor(a) al nmero que aparece a continuacin.   Por favor, tenga en cuenta que aunque hacemos todo lo posible para estar disponibles para asuntos urgentes fuera del horario de oficina, no estamos disponibles las 24 horas del da, los 7 das de la semana.   Si tiene un problema urgente y no puede comunicarse con nosotros, puede optar por buscar atencin mdica  en el consultorio de su doctor(a), en una clnica privada, en un centro de atencin urgente o en una sala de emergencias.  Si tiene una emergencia mdica, por favor llame inmediatamente al 911 o vaya a la sala de emergencias.  Nmeros de bper  - Dr. Kowalski: [phone]  - Dra. Moye: [phone]  - Dra. Stewart: [phone]  En caso de inclemencias del tiempo, por favor llame a nuestra lnea principal al [phone] para una actualizacin sobre el estado de cualquier retraso o cierre.  Consejos para la medicacin en dermatologa: Por favor, guarde las cajas en las que vienen los medicamentos de uso tpico para ayudarle a seguir las instrucciones sobre dnde y cmo usarlos. Las farmacias generalmente imprimen las instrucciones del medicamento slo en las cajas y no directamente en los tubos del medicamento.   Si su medicamento es muy caro, por favor, pngase en contacto con nuestra oficina llamando al [phone] y presione la opcin 4 o envenos un mensaje a travs de MyChart.   No podemos decirle cul ser su copago por los medicamentos por adelantado ya que esto es diferente dependiendo de la cobertura de su seguro. Sin embargo, es posible que podamos encontrar un medicamento sustituto a menor costo o llenar un formulario para que el  seguro cubra el medicamento que se considera necesario.   Si se requiere una autorizacin previa para que su compaa de seguros cubra su medicamento, por favor permtanos de 1 a 2 das hbiles para completar este proceso.  Los precios de los medicamentos varan con frecuencia dependiendo del lugar de dnde se surte la receta y alguna farmacias pueden ofrecer precios ms baratos.  El sitio web www.goodrx.com tiene cupones para medicamentos de diferentes farmacias. Los precios aqu no tienen en cuenta lo que podra costar con la ayuda del seguro (puede ser ms barato con su seguro), pero el sitio web puede darle el precio si no utiliz ningn seguro.  - Puede imprimir el cupn correspondiente y llevarlo con su receta a la farmacia.  - Tambin puede pasar por nuestra oficina durante el horario de atencin regular y recoger una tarjeta de cupones de GoodRx.  - Si necesita que su receta se enve electrnicamente a una farmacia diferente, informe a nuestra oficina a travs de MyChart de [place]   o por telfono llamando al [phone] y presione la opcin 4.  

## 2022-12-21 NOTE — Progress Notes (Signed)
Daily Session Note  Patient Details  Name: Andrea [name] MRN: [id number] Date of Birth: Sep 22, 1971 Referring Provider:   Flowsheet Row Cardiac Rehab from 10/07/2022 in [facility] Cardiac and Pulmonary Rehab  Referring Provider Kathlyn Sacramento MD       Encounter Date: 12/21/2022  Check In:  Session Check In - 12/21/22 0733       Check-In   Supervising physician immediately available to respond to emergencies See telemetry face sheet for immediately available ER MD    Location [facility]-Cardiac & Pulmonary Rehab    Staff Present Darlyne Russian, RN, Doyce Para, BS, ACSM CEP, Exercise Physiologist;Joseph Tessie Fass, [place]    Virtual Visit No    Medication changes reported     No    Fall or balance concerns reported    No    Tobacco Cessation No Change    Warm-up and Cool-down Performed on first and last piece of equipment    Resistance Training Performed Yes    VAD Patient? No    PAD/SET Patient? No      Pain Assessment   Currently in Pain? No/denies                Social History   Tobacco Use  Smoking Status Former   Packs/day: 0.75   Years: 35.00   Total pack years: 26.25   Types: Cigarettes   Quit date: 06/25/2022   Years since quitting: 0.4  Smokeless Tobacco Never    Goals Met:  Independence with exercise equipment Exercise tolerated well No report of concerns or symptoms today Strength training completed today  Goals Unmet:  Not Applicable  Comments: Pt able to follow exercise prescription today without complaint.  Will continue to monitor for progression.    Dr. Emily Filbert is Medical Director for [place].  Dr. Ottie Glazier is Medical Director for [facility] Pulmonary Rehabilitation.

## 2022-12-21 NOTE — Progress Notes (Signed)
Follow-Up Visit   Subjective  Andrea [name] is a 52 y.o. female who presents for the following: Annual Exam (Hx dysplastic nevus). The patient presents for Total-Body Skin Exam (TBSE) for skin cancer screening and mole check.  The patient has spots, moles and lesions to be evaluated, some may be new or changing and the patient has concerns that these could be cancer.  The following portions of the chart were reviewed this encounter and updated as appropriate:   Tobacco  Allergies  Meds  Problems  Med Hx  Surg Hx  Fam Hx     Review of Systems:  No other skin or systemic complaints except as noted in HPI or Assessment and Plan.  Objective  Well appearing patient in no apparent distress; mood and affect are within normal limits.  A full examination was performed including scalp, head, eyes, ears, nose, lips, neck, chest, axillae, abdomen, back, buttocks, bilateral upper extremities, bilateral lower extremities, hands, feet, fingers, toes, fingernails, and toenails. All findings within normal limits unless otherwise noted below.  R upper arm Erythematous stuck-on, waxy papule or plaque  B/L hand Clear today.  L chin below the oral commisure 1.2 firm SQ nodule.   Assessment & Plan  Inflamed seborrheic keratosis R upper arm Tender at times, but pt defers tx today. Benign-appearing.  Observation.  Call clinic for new or changing lesions.  Recommend daily use of broad spectrum spf 30+ sunscreen to sun-exposed areas.   Hand dermatitis B/L hand Hand Dermatitis is a chronic type of eczema that can come and go on the hands and fingers.  While there is no cure, the rash and symptoms can be managed with topical prescription medications, and for more severe cases, with systemic medications.  Recommend mild soap and routine use of moisturizing cream after handwashing.  Minimize soap/water exposure when possible.    Epidermal inclusion cyst L chin below the oral commisure Hx of  pus per patient, benign-appearing.  Exam most consistent with an epidermal inclusion cyst. Discussed that a cyst is a benign growth that can grow over time and sometimes get irritated or inflamed. Recommend observation if it is not bothersome. Discussed option of surgical excision to remove it if it is growing, symptomatic, or other changes noted. Please call for new or changing lesions so they can be evaluated.   Recommend plastic surgeon excising lesion due to location and difficult dynamic movement in that area with respect to excision orientation and scar.  Will refer to Dr. Claudia Desanctis.  Lentigines - Scattered tan macules - Due to sun exposure - Benign-appearing, observe - Recommend daily broad spectrum sunscreen SPF 30+ to sun-exposed areas, reapply every 2 hours as needed. - Call for any changes  Seborrheic Keratoses - Stuck-on, waxy, tan-brown papules and/or plaques  - Benign-appearing - Discussed benign etiology and prognosis. - Observe - Call for any changes  Melanocytic Nevi - Tan-brown and/or pink-flesh-colored symmetric macules and papules - Benign appearing on exam today - Observation - Call clinic for new or changing moles - Recommend daily use of broad spectrum spf 30+ sunscreen to sun-exposed areas.   Hemangiomas - Red papules - Discussed benign nature - Observe - Call for any changes  Actinic Damage - Chronic condition, secondary to cumulative UV/sun exposure - diffuse scaly erythematous macules with underlying dyspigmentation - Recommend daily broad spectrum sunscreen SPF 30+ to sun-exposed areas, reapply every 2 hours as needed.  - Staying in the shade or wearing long sleeves, sun glasses (UVA+UVB protection)  and wide brim hats (4-inch brim around the entire circumference of the hat) are also recommended for sun protection.  - Call for new or changing lesions.  History of Dysplastic Nevus - No evidence of recurrence today - Recommend regular full body skin  exams - Recommend daily broad spectrum sunscreen SPF 30+ to sun-exposed areas, reapply every 2 hours as needed.  - Call if any new or changing lesions are noted between office visits  Acrochordons (Skin Tags) - Fleshy, skin-colored pedunculated papules - Benign appearing.  - Observe. - If desired, they can be removed with an in office procedure that is not covered by insurance. - Please call the clinic if you notice any new or changing lesions.  Skin cancer screening performed today.  Return in about 1 year (around 12/22/2023) for TBSE.  Luther Redo, CMA, am acting as scribe for Sarina Ser, MD . Documentation: I have reviewed the above documentation for accuracy and completeness, and I agree with the above.  Sarina Ser, MD

## 2022-12-22 ENCOUNTER — Encounter: Payer: Self-pay | Admitting: Nurse Practitioner

## 2022-12-22 ENCOUNTER — Encounter: Payer: 59 | Admitting: *Deleted

## 2022-12-22 DIAGNOSIS — I252 Old myocardial infarction: Secondary | ICD-10-CM | POA: Diagnosis not present

## 2022-12-22 DIAGNOSIS — I214 Non-ST elevation (NSTEMI) myocardial infarction: Secondary | ICD-10-CM

## 2022-12-22 NOTE — Progress Notes (Signed)
Daily Session Note  Patient Details  Name: Andrea [name] MRN: [id number] Date of Birth: 1971/05/08 Referring Provider:   Flowsheet Row Cardiac Rehab from 10/07/2022 in [facility] Cardiac and Pulmonary Rehab  Referring Provider Kathlyn Sacramento MD       Encounter Date: 12/22/2022  Check In:  Session Check In - 12/22/22 0714       Check-In   Supervising physician immediately available to respond to emergencies See telemetry face sheet for immediately available ER MD    Location [facility]-Cardiac & Pulmonary Rehab    Staff Present Darlyne Russian, RN, ADN;Jessica Luan Pulling, MA, RCEP, CCRP, Bertram Gala, MS, ACSM CEP, Exercise Physiologist    Virtual Visit No    Medication changes reported     No    Fall or balance concerns reported    No    Tobacco Cessation No Change    Warm-up and Cool-down Performed on first and last piece of equipment    Resistance Training Performed Yes    VAD Patient? No    PAD/SET Patient? No      Pain Assessment   Currently in Pain? No/denies                Social History   Tobacco Use  Smoking Status Former   Packs/day: 0.75   Years: 35.00   Total pack years: 26.25   Types: Cigarettes   Quit date: 06/25/2022   Years since quitting: 0.4  Smokeless Tobacco Never    Goals Met:  Independence with exercise equipment Exercise tolerated well No report of concerns or symptoms today Strength training completed today  Goals Unmet:  Not Applicable  Comments: Pt able to follow exercise prescription today without complaint.  Will continue to monitor for progression.    Dr. Emily Filbert is Medical Director for [place].  Dr. Ottie Glazier is Medical Director for [facility] Pulmonary Rehabilitation.

## 2022-12-24 ENCOUNTER — Encounter: Payer: 59 | Admitting: *Deleted

## 2022-12-24 DIAGNOSIS — I214 Non-ST elevation (NSTEMI) myocardial infarction: Secondary | ICD-10-CM

## 2022-12-24 DIAGNOSIS — I252 Old myocardial infarction: Secondary | ICD-10-CM | POA: Diagnosis not present

## 2022-12-24 NOTE — Progress Notes (Signed)
Daily Session Note  Patient Details  Name: Andrea [name] MRN: [id number] Date of Birth: December 25, 1970 Referring Provider:   Flowsheet Row Cardiac Rehab from 10/07/2022 in [facility] Cardiac and Pulmonary Rehab  Referring Provider Kathlyn Sacramento MD       Encounter Date: 12/24/2022  Check In:  Session Check In - 12/24/22 0728       Check-In   Supervising physician immediately available to respond to emergencies See telemetry face sheet for immediately available ER MD    Location [facility]-Cardiac & Pulmonary Rehab    Staff Present Darlyne Russian, RN, ADN;Laureen Owens Shark, BS, RRT, CPFT;Joseph Ogden, [place], MA, RCEP, CCRP, CCET    Virtual Visit No    Medication changes reported     No    Fall or balance concerns reported    No    Tobacco Cessation No Change    Warm-up and Cool-down Performed on first and last piece of equipment    Resistance Training Performed Yes    VAD Patient? No    PAD/SET Patient? No      Pain Assessment   Currently in Pain? No/denies                Social History   Tobacco Use  Smoking Status Former   Packs/day: 0.75   Years: 35.00   Total pack years: 26.25   Types: Cigarettes   Quit date: 06/25/2022   Years since quitting: 0.4  Smokeless Tobacco Never    Goals Met:  Independence with exercise equipment Exercise tolerated well No report of concerns or symptoms today Strength training completed today  Goals Unmet:  Not Applicable  Comments: Pt able to follow exercise prescription today without complaint.  Will continue to monitor for progression.    Dr. Emily Filbert is Medical Director for [place].  Dr. Ottie Glazier is Medical Director for [facility] Pulmonary Rehabilitation.

## 2022-12-29 ENCOUNTER — Encounter: Payer: Self-pay | Admitting: Dermatology

## 2022-12-30 ENCOUNTER — Other Ambulatory Visit: Payer: Self-pay

## 2022-12-30 DIAGNOSIS — L720 Epidermal cyst: Secondary | ICD-10-CM

## 2023-01-04 ENCOUNTER — Encounter: Payer: 59 | Attending: Cardiovascular Disease | Admitting: *Deleted

## 2023-01-04 DIAGNOSIS — Z48812 Encounter for surgical aftercare following surgery on the circulatory system: Secondary | ICD-10-CM | POA: Insufficient documentation

## 2023-01-04 DIAGNOSIS — I252 Old myocardial infarction: Secondary | ICD-10-CM | POA: Diagnosis not present

## 2023-01-04 DIAGNOSIS — I214 Non-ST elevation (NSTEMI) myocardial infarction: Secondary | ICD-10-CM

## 2023-01-04 NOTE — Progress Notes (Signed)
Daily Session Note  Patient Details  Name: Andrea [name] MRN: [id number] Date of Birth: Mar 06, 1971 Referring Provider:   Flowsheet Row Cardiac Rehab from 10/07/2022 in [facility] Cardiac and Pulmonary Rehab  Referring Provider Kathlyn Sacramento MD       Encounter Date: 01/04/2023  Check In:  Session Check In - 01/04/23 0737       Check-In   Supervising physician immediately available to respond to emergencies See telemetry face sheet for immediately available ER MD    Location [facility]-Cardiac & Pulmonary Rehab    Staff Present Darlyne Russian, RN, Doyce Para, BS, ACSM CEP, Exercise Physiologist;Joseph Tessie Fass, [place]    Virtual Visit No    Medication changes reported     No    Fall or balance concerns reported    No    Tobacco Cessation No Change    Warm-up and Cool-down Performed on first and last piece of equipment    Resistance Training Performed Yes    VAD Patient? No    PAD/SET Patient? No      Pain Assessment   Currently in Pain? No/denies                Social History   Tobacco Use  Smoking Status Former   Packs/day: 0.75   Years: 35.00   Total pack years: 26.25   Types: Cigarettes   Quit date: 06/25/2022   Years since quitting: 0.5  Smokeless Tobacco Never    Goals Met:  Independence with exercise equipment Exercise tolerated well No report of concerns or symptoms today Strength training completed today  Goals Unmet:  Not Applicable  Comments: Pt able to follow exercise prescription today without complaint.  Will continue to monitor for progression.    Dr. Emily Filbert is Medical Director for [place].  Dr. Ottie Glazier is Medical Director for [facility] Pulmonary Rehabilitation.

## 2023-01-05 ENCOUNTER — Encounter: Payer: Self-pay | Admitting: Nurse Practitioner

## 2023-01-05 ENCOUNTER — Ambulatory Visit (INDEPENDENT_AMBULATORY_CARE_PROVIDER_SITE_OTHER): Payer: 59 | Admitting: Nurse Practitioner

## 2023-01-05 VITALS — BP 132/72 | HR 79 | Ht 67.75 in | Wt 256.0 lb

## 2023-01-05 DIAGNOSIS — Z780 Asymptomatic menopausal state: Secondary | ICD-10-CM | POA: Diagnosis not present

## 2023-01-05 DIAGNOSIS — Z01419 Encounter for gynecological examination (general) (routine) without abnormal findings: Secondary | ICD-10-CM

## 2023-01-05 DIAGNOSIS — Z13820 Encounter for screening for osteoporosis: Secondary | ICD-10-CM

## 2023-01-05 DIAGNOSIS — Z87891 Personal history of nicotine dependence: Secondary | ICD-10-CM

## 2023-01-05 NOTE — Progress Notes (Signed)
Andrea [name] [facility] 1971/08/04 [id number]   History:  52 y.o. [id number] presents for annual exam. Postmenopausal - no HRT. 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  Normal mammogram history. Possible mild MI in December, may also be angina or coronary vasospasm. Cardiac rehab, aspirin and Plavix x 1 year, being followed by cardiology. Family history of early onset CAD. Quit smoking September 2023 after 30 years. H/O anemia likely s/t bleeding hemorrhoids. Has qualified for hemorrhoid removal and considering this.   Gynecologic History No LMP recorded (lmp unknown). Patient has had a hysterectomy.   Contraception/Family planning: status post hysterectomy Sexually active: Yes  Health Maintenance Last Pap: 12/31/2021. Results were: Normal neg HPV, 3-year repeat Last mammogram: 07/10/2022. Results were: Normal Last colonoscopy: 12/12/2019. Results were: Normal, 5-year recall Last Dexa: Never  Past medical history, past surgical history, family history and social history were all reviewed and documented in the EPIC chart. Married. Works for [employer]. 62 yo son, married, 15 yo daughter. 33 yo son. Mother deceased from colon cancer.   ROS:  A ROS was performed and pertinent positives and negatives are included.  Exam:  Vitals:   01/05/23 1106  BP: 132/72  Pulse: 79  SpO2: 96%  Weight: 256 lb (116.1 kg)  Height: 5' 7.75" (1.721 m)     Body mass index is 39.21 kg/m.  General appearance:  Normal Thyroid:  Symmetrical, normal in size, without palpable masses or nodularity. Respiratory  Auscultation:  Clear without wheezing or rhonchi Cardiovascular  Auscultation:  Regular rate, without rubs, murmurs or gallops  Edema/varicosities:  Not grossly evident Abdominal  Soft,nontender, without masses, guarding or rebound.  Liver/spleen:  No organomegaly  noted  Hernia:  None appreciated  Skin  Inspection:  Grossly normal   Breasts: Examined lying and sitting.   Right: Without masses, retractions, discharge or axillary adenopathy.   Left: Without masses, retractions, discharge or axillary adenopathy. Genitourinary   Inguinal/mons:  Normal without inguinal adenopathy  External genitalia:  Normal appearing vulva with no masses, tenderness, or lesions  BUS/Urethra/Skene's glands:  Normal  Vagina:  Normal appearing with normal color and discharge, no lesions  Cervix:  Absent  Uterus:  Absent  Adnexa/parametria:     Rt: Normal in size, without masses or tenderness.   Lt: Normal in size, without masses or tenderness.  Anus and perineum: Normal  Digital rectal exam: Deferred  Patient informed chaperone available to be present for breast and pelvic exam. Patient has requested no chaperone to be present. Patient has been advised what will be completed during breast and pelvic exam.   Assessment/Plan:  52 y.o. [id number] for annual exam.   Well female exam with routine gynecological exam - Education provided on SBEs, importance of preventative screenings, current guidelines, high calcium diet, regular exercise, and multivitamin daily. Labs with PCP.   Postmenopausal - Plan: DG Bone Density. No HRT, no bleeding  Former cigarette smoker - Plan: DG Bone Density. Quit September 2023 after 0.5-1 ppd x 3 years.   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 Normal vaginal pap x 3. Will return to 3-year interval, due in 2026.  Screening for breast cancer - Normal mammogram history. Continue annual screenings.  Normal breast exam today.  Screening for colon cancer - 2021 colonoscopy  with 5-year repeat recommended. Mother deceased from colon cancer. Patient with history of bleeding hemorrhoids and diverticulitis.  Follow up  in 1 year for annual.     [name] [facility], 12:14 PM 01/05/2023

## 2023-01-07 ENCOUNTER — Encounter: Payer: 59 | Admitting: *Deleted

## 2023-01-07 DIAGNOSIS — I252 Old myocardial infarction: Secondary | ICD-10-CM | POA: Diagnosis not present

## 2023-01-07 DIAGNOSIS — I214 Non-ST elevation (NSTEMI) myocardial infarction: Secondary | ICD-10-CM

## 2023-01-07 NOTE — Progress Notes (Signed)
Cardiac Individual Treatment Plan  Patient Details  Name: Jewell Ambriz MRN: [id number] Date of Birth: [birth year] Referring Provider:   Flowsheet Row Cardiac Rehab from 10/07/2022 in [facility] Cardiac and Pulmonary Rehab  Referring Provider Kathlyn Sacramento MD       Initial Encounter Date:  Flowsheet Row Cardiac Rehab from 10/07/2022 in [facility] Cardiac and Pulmonary Rehab  Date 10/07/22       Visit Diagnosis: NSTEMI (non-ST elevated myocardial infarction) [facility])  Patient's Home Medications on Admission:  Current Outpatient Medications:    acetaminophen (TYLENOL) 325 MG tablet, Take 2 tablets (650 mg total) by mouth every 6 (six) hours as needed for mild pain (or temp > 100)., Disp:  , Rfl:    aspirin EC 81 MG tablet, Take 1 tablet (81 mg total) by mouth daily. Swallow whole., Disp: 30 tablet, Rfl: 12   atorvastatin (LIPITOR) 40 MG tablet, Take 1 tablet (40 mg total) by mouth every evening., Disp: 90 tablet, Rfl: 1   clopidogrel (PLAVIX) 75 MG tablet, Take 1 tablet (75 mg total) by mouth daily with breakfast., Disp: 90 tablet, Rfl: 3   diltiazem (CARDIZEM CD) 120 MG 24 hr capsule, Take 1 capsule (120 mg total) by mouth daily., Disp: 90 capsule, Rfl: 1   fenofibrate 160 MG tablet, Take 1 tablet (160 mg total) by mouth daily., Disp: 180 tablet, Rfl: 3   hydrocortisone (ANUSOL-HC) 2.5 % rectal cream, , Disp: , Rfl:    pantoprazole (PROTONIX) 40 MG tablet, Take 1 tablet (40 mg total) by mouth daily., Disp: 30 tablet, Rfl: 11  Past Medical History: Past Medical History:  Diagnosis Date   Abnormal Pap smear of cervix    2010-2014 abnormal pap every 3-6 months   dysplastic squamous cells of cervix    Anxiety    occ.- no meds   Blood in stool    Depression    no meds   Diverticulitis 08/2019   DUB (dysfunctional uterine bleeding)    Dyslipidemia    Dyspnea    Resolved - due to anemia - no current problems   Eczema    Endometriosis    Epigastric pain    Fatigue    Fibroid     GERD (gastroesophageal reflux disease)    Grade I diastolic dysfunction 0000000   Noted on ECHO   Hemorrhoid    2nd and 3rd degree   History of adenomatous polyp of colon    History of palpitations    HSV infection    Hx of dysplastic nevus 07/17/2019   L upper back 4.0cm lateral to spine, moderate   Hyperlipidemia    diet controlled - no meds   Hypertension    not currently taking medication   Infertility, female    Iron deficiency anemia    Last iron infusion 09/14/2018   Left groin hernia    history of   Muscle cramping    with exertion   Myocardial infarction (HCC)    Mild per pt   Obesity    Palpitations    Recurrent bacterial infection    Vaginal   Restless leg    due to anemia   Smoker    SVD (spontaneous vaginal delivery)    x 2   Thoracic back pain    while trying to sleep   Tinnitus, left    hears heart beat, MRI normal   Vertigo 2018    Tobacco Use: Social History   Tobacco Use  Smoking Status Former   Packs/day:  0.75   Years: 35.00   Additional pack years: 0.00   Total pack years: 26.25   Types: Cigarettes   Quit date: 06/25/2022   Years since quitting: 0.5  Smokeless Tobacco Never    Labs: Review Flowsheet       Latest Ref Rng & Units 05/29/2022 09/30/2022  Labs for ITP Cardiac and Pulmonary Rehab  Cholestrol 0 - 200 mg/dL 245  200   LDL (calc) 0 - 99 mg/dL 103  111   HDL-C >40 mg/dL 39  43   Trlycerides <150 mg/dL 602  229      Exercise Target Goals: Exercise Program Goal: Individual exercise prescription set using results from initial 6 min walk test and THRR while considering  patient's activity barriers and safety.   Exercise Prescription Goal: Initial exercise prescription builds to 30-45 minutes a day of aerobic activity, 2-3 days per week.  Home exercise guidelines will be given to patient during program as part of exercise prescription that the participant will acknowledge.   Education: Aerobic Exercise: - Group verbal  and visual presentation on the components of exercise prescription. Introduces F.I.T.T principle from ACSM for exercise prescriptions.  Reviews F.I.T.T. principles of aerobic exercise including progression. Written material given at graduation. Flowsheet Row Cardiac Rehab from 12/17/2022 in [facility] Cardiac and Pulmonary Rehab  Education need identified 10/07/22       Education: Resistance Exercise: - Group verbal and visual presentation on the components of exercise prescription. Introduces F.I.T.T principle from ACSM for exercise prescriptions  Reviews F.I.T.T. principles of resistance exercise including progression. Written material given at graduation. Flowsheet Row Cardiac Rehab from 12/17/2022 in [facility] Cardiac and Pulmonary Rehab  Date 11/19/22  Educator [facility]  Instruction Review Code 1- Verbalizes Understanding        Education: Exercise & Equipment Safety: - Individual verbal instruction and demonstration of equipment use and safety with use of the equipment. Flowsheet Row Cardiac Rehab from 12/17/2022 in [facility] Cardiac and Pulmonary Rehab  Education need identified 10/07/22  Date 10/07/22  Educator [facility]  Instruction Review Code 1- Verbalizes Understanding       Education: Exercise Physiology & General Exercise Guidelines: - Group verbal and written instruction with models to review the exercise physiology of the cardiovascular system and associated critical values. Provides general exercise guidelines with specific guidelines to those with heart or lung disease.  Flowsheet Row Cardiac Rehab from 12/17/2022 in [facility] Cardiac and Pulmonary Rehab  Education need identified 10/07/22  Date 11/05/22  Educator [facility]  Instruction Review Code 1- [employer] Understanding       Education: Flexibility, Balance, Mind/Body Relaxation: - Group verbal and visual presentation with interactive activity on the components of exercise prescription. Introduces F.I.T.T principle from ACSM for exercise  prescriptions. Reviews F.I.T.T. principles of flexibility and balance exercise training including progression. Also discusses the mind body connection.  Reviews various relaxation techniques to help reduce and manage stress (i.e. Deep breathing, progressive muscle relaxation, and visualization). Balance handout provided to take home. Written material given at graduation. Flowsheet Row Cardiac Rehab from 12/17/2022 in [facility] Cardiac and Pulmonary Rehab  Date 11/26/22  Educator [facility]  Instruction Review Code 1- Verbalizes Understanding       Activity Barriers & Risk Stratification:  Activity Barriers & Cardiac Risk Stratification - 10/06/22 1435       Activity Barriers & Cardiac Risk Stratification   Activity Barriers Back Problems   back pain but xray clear   Cardiac Risk Stratification High   still having chest  pain            6 Minute Walk:  6 Minute Walk     Row Name 10/07/22 1711 12/17/22 0852       6 Minute Walk   Phase Initial Discharge    Distance 1215 feet 1623 feet    Distance % Change -- 33.58 %    Distance Feet Change -- 408 ft    Walk Time 6 minutes 6 minutes    # of Rest Breaks 0 0    MPH 2.3 3.07    METS 3.37 5.14    RPE 15 14    Perceived Dyspnea  3 --    VO2 Peak 11.81 17.98    Symptoms Yes (comment) No    Comments Chest pain 6/10 - resolved with rest, SOB --    Resting HR 65 bpm 100 bpm    Resting BP 122/72 134/70    Resting Oxygen Saturation  97 % 96 %    Exercise Oxygen Saturation  during 6 min walk 98 % 95 %    Max Ex. HR 111 bpm 139 bpm    Max Ex. BP 154/70 234/84    2 Minute Post BP 110/68 172/74             Oxygen Initial Assessment:   Oxygen Re-Evaluation:   Oxygen Discharge (Final Oxygen Re-Evaluation):   Initial Exercise Prescription:  Initial Exercise Prescription - 10/07/22 1700       Date of Initial Exercise RX and Referring Provider   Date 10/07/22    Referring Provider Kathlyn Sacramento MD      Treadmill   MPH 1.8     Grade 0    Minutes 15    METs 2.38      Recumbant Bike   Level 1    RPM 60    Watts 30    Minutes 15    METs 3.3      NuStep   Level 1    SPM 80    Minutes 15    METs 3.3      REL-XR   Level 1    Speed 50    Minutes 15    METs 3.3      T5 Nustep   Level 1    SPM 80    Minutes 15    METs 3.3      Prescription Details   Frequency (times per week) 3    Duration Progress to 30 minutes of continuous aerobic without signs/symptoms of physical distress      Intensity   THRR 40-80% of Max Heartrate 105 - 146    Ratings of Perceived Exertion 11-13    Perceived Dyspnea 0-4      Progression   Progression Continue to progress workloads to maintain intensity without signs/symptoms of physical distress.      Resistance Training   Training Prescription Yes    Weight 5 lb    Reps 10-15             Perform Capillary Blood Glucose checks as needed.  Exercise Prescription Changes:   Exercise Prescription Changes     Row Name 10/07/22 1700 10/20/22 0700 10/22/22 1600 11/04/22 1600 11/18/22 1100     Response to Exercise   Blood Pressure (Admit) 122/72 -- 134/72 124/72 122/68   Blood Pressure (Exercise) 154/70 -- 164/74 164/74 136/70   Blood Pressure (Exit) 110/68 -- 148/70 122/62 118/64   Heart Rate (Admit) 65 bpm -- 96  bpm 87 bpm 84 bpm   Heart Rate (Exercise) 111 bpm -- 136 bpm 144 bpm 135 bpm   Heart Rate (Exit) 65 bpm -- 92 bpm 87 bpm 86 bpm   Oxygen Saturation (Admit) 97 % -- -- -- --   Oxygen Saturation (Exercise) 98 % -- -- -- --   Rating of Perceived Exertion (Exercise) 15 -- '15 15 15   '$ Perceived Dyspnea (Exercise) 3 -- -- -- --   Symptoms chest pain 6/10 , resolved with rest; SOB -- 2/10 Chest pain, resolved with rest none none   Comments walk test results -- 4th full day of exercise -- --   Duration -- -- Continue with 30 min of aerobic exercise without signs/symptoms of physical distress. Continue with 30 min of aerobic exercise without signs/symptoms of  physical distress. Continue with 30 min of aerobic exercise without signs/symptoms of physical distress.   Intensity -- -- THRR unchanged THRR unchanged THRR unchanged     Progression   Progression -- -- Continue to progress workloads to maintain intensity without signs/symptoms of physical distress. Continue to progress workloads to maintain intensity without signs/symptoms of physical distress. Continue to progress workloads to maintain intensity without signs/symptoms of physical distress.   Average METs -- -- 2.75 3.39 4.2     Resistance Training   Training Prescription -- -- Yes Yes Yes   Weight -- -- 4 lb 5 lb 7 lb   Reps -- -- 10-15 10-15 10-15     Interval Training   Interval Training -- -- No No No     Treadmill   MPH -- -- 2 2.2 2.7   Grade -- -- 1.'5 2 2   '$ Minutes -- -- '15 15 15   '$ METs -- -- 2.95 3.29 3.81     Recumbant Bike   Level -- -- -- 4 --   Watts -- -- -- 36 --   Minutes -- -- -- 15 --   METs -- -- -- 3 --     NuStep   Level -- -- '3 6 6   '$ Minutes -- -- '15 15 15   '$ METs -- -- 4 5.1 5.7     T5 Nustep   Level -- -- 4 6 --   Minutes -- -- 15 15 --   METs -- -- 2.1 2.5 --     Biostep-RELP   Level -- -- 2 -- --   Minutes -- -- 15 -- --   METs -- -- 2 -- --     Home Exercise Plan   Plans to continue exercise at -- [employer] (comment)  [employer] (comment)  [employer] seismologist (comment)  Field seismologist (comment)  [employer]   Frequency -- Add 2 additional days to program exercise sessions. Add 2 additional days to program exercise sessions. Add 2 additional days to program exercise sessions. Add 2 additional days to program exercise sessions.   Initial Home Exercises Provided -- 10/20/22 10/20/22 10/20/22 10/20/22     Oxygen   Maintain Oxygen Saturation -- -- 88% or higher 88% or higher 88% or higher    Row Name 12/02/22 1500 12/16/22 1300           Response to Exercise   Blood Pressure (Admit)  110/60 126/60      Blood Pressure (Exit) 132/68 134/72      Heart Rate (Admit) 91 bpm 88 bpm      Heart Rate (Exercise) 151 bpm  132 bpm      Heart Rate (Exit) 92 bpm 104 bpm      Rating of Perceived Exertion (Exercise) 15 15      Symptoms none none      Duration Continue with 30 min of aerobic exercise without signs/symptoms of physical distress. Continue with 30 min of aerobic exercise without signs/symptoms of physical distress.      Intensity THRR unchanged THRR unchanged        Progression   Progression Continue to progress workloads to maintain intensity without signs/symptoms of physical distress. Continue to progress workloads to maintain intensity without signs/symptoms of physical distress.      Average METs 4.58 4.6        Resistance Training   Training Prescription Yes Yes      Weight 7 lb 8 lb      Reps 10-15 10-15        Interval Training   Interval Training No No        Treadmill   MPH 3 3      Grade 2 2      Minutes 15 15      METs 4.12 4.12        NuStep   Level 7 7      Minutes 15 15      METs 6 4.7        REL-XR   Level 7 6      Minutes 15 15        Home Exercise Plan   Plans to continue exercise at [employer] (comment)  [employer] se[employer] (comment)  [employer]      Frequency Add 2 additional days to program exercise sessions. Add 2 additional days to program exercise sessions.      Initial Home Exercises Provided 10/20/22 10/20/22        Oxygen   Maintain Oxygen Saturation 88% or higher 88% or higher               Exercise Comments:   Exercise Comments     Row Name 10/12/22 0757           Exercise Comments First full day of exercise!  Patient was oriented to gym and equipment including functions, settings, policies, and procedures.  Patient's individual exercise prescription and treatment plan were reviewed.  All starting workloads were established based on the results of the 6 minute walk test done at initial  orientation visit.  The plan for exercise progression was also introduced and progression will be customized based on patient's performance and goals.                Exercise Goals and Review:   Exercise Goals     Row Name 10/07/22 1717             Exercise Goals   Increase Physical Activity Yes       Intervention Develop an individualized exercise prescription for aerobic and resistive training based on initial evaluation findings, risk stratification, comorbidities and participant's personal goals.;Provide advice, education, support and counseling about physical activity/exercise needs.       Expected Outcomes Short Term: Attend rehab on a regular basis to increase amount of physical activity.;Long Term: Add in home exercise to make exercise part of routine and to increase amount of physical activity.;Long Term: Exercising regularly at least 3-5 days a week.       Increase Strength and Stamina Yes  Intervention Provide advice, education, support and counseling about physical activity/exercise needs.;Develop an individualized exercise prescription for aerobic and resistive training based on initial evaluation findings, risk stratification, comorbidities and participant's personal goals.       Expected Outcomes Short Term: Increase workloads from initial exercise prescription for resistance, speed, and METs.;Short Term: Perform resistance training exercises routinely during rehab and add in resistance training at home;Long Term: Improve cardiorespiratory fitness, muscular endurance and strength as measured by increased METs and functional capacity (6MWT)       Able to understand and use rate of perceived exertion (RPE) scale Yes       Intervention Provide education and explanation on how to use RPE scale       Expected Outcomes Short Term: Able to use RPE daily in rehab to express subjective intensity level;Long Term:  Able to use RPE to guide intensity level when exercising  independently       Able to understand and use Dyspnea scale Yes       Intervention Provide education and explanation on how to use Dyspnea scale       Expected Outcomes Short Term: Able to use Dyspnea scale daily in rehab to express subjective sense of shortness of breath during exertion;Long Term: Able to use Dyspnea scale to guide intensity level when exercising independently       Knowledge and understanding of Target Heart Rate Range (THRR) Yes       Intervention Provide education and explanation of THRR including how the numbers were predicted and where they are located for reference       Expected Outcomes Short Term: Able to state/look up THRR;Long Term: Able to use THRR to govern intensity when exercising independently;Short Term: Able to use daily as guideline for intensity in rehab       Able to check pulse independently Yes       Intervention Provide education and demonstration on how to check pulse in carotid and radial arteries.;Review the importance of being able to check your own pulse for safety during independent exercise       Expected Outcomes Long Term: Able to check pulse independently and accurately;Short Term: Able to explain why pulse checking is important during independent exercise       Understanding of Exercise Prescription Yes       Intervention Provide education, explanation, and written materials on patient's individual exercise prescription       Expected Outcomes Short Term: Able to explain program exercise prescription;Long Term: Able to explain home exercise prescription to exercise independently                Exercise Goals Re-Evaluation :  Exercise Goals Re-Evaluation     Row Name 10/12/22 0757 10/20/22 0754 10/22/22 1650 10/29/22 0823 11/04/22 1624     Exercise Goal Re-Evaluation   Exercise Goals Review Able to understand and use rate of perceived exertion (RPE) scale;Able to understand and use Dyspnea scale;Knowledge and understanding of Target Heart  Rate Range (THRR);Understanding of Exercise Prescription Able to understand and use rate of perceived exertion (RPE) scale;Able to understand and use Dyspnea scale;Knowledge and understanding of Target Heart Rate Range (THRR);Understanding of Exercise Prescription;Able to check pulse independently;Increase Strength and Stamina;Increase Physical Activity Increase Physical Activity;Increase Strength and Stamina;Understanding of Exercise Prescription Increase Physical Activity;Increase Strength and Stamina;Understanding of Exercise Prescription Increase Physical Activity;Increase Strength and Stamina;Understanding of Exercise Prescription   Comments Reviewed RPE scale, THR and program prescription with pt today.  Pt voiced understanding  and was given a copy of goals to take home. Reviewed home exercise with pt today.  Pt plans to walk and join her daughter at [facility] for exercise.  She also has equipment in her basement.  Reviewed THR, pulse, RPE, sign and symptoms, pulse oximetery and when to call 911 or MD.  Also discussed weather considerations and indoor options.  Pt voiced understanding. Janace Hoard is doing well for the first couple of weeks she has been here. She has increased all her workloads so far, specifically up to level 3 and 4 on both the T4 and T5 Nustep, respectively. She has also increased to 2.0 mph/ 1.5% incline  on the treadmill. She still has had some mild chest pain, typically 2/10 and resolves easily with rest. Her doctors are still aware and working on medication changes. We will continue to monitor. Patient plans to walk at home and attend the gym for her exercise. Reviewed THR and encouraged to watch her HR when exercising. Also encouraged to incorporate some type of resistance training while at the gym, either using free weight or weight training. Patient plans to continue to exercise outside of rehab to equate 3-5 days total/week. Janace Hoard is doing well in rehab. She recently increased her  overall MET level to 3.39 METs. She also improved her workloads on her seated machines, up to level 6 on the T4 and T5, and level 4 on the recumbent bike. She also increased her workload on the treadmill to a speed of 2.2 mph and an incline of 2%. We will continue to monitor her progress in the program.   Expected Outcomes Short: Use RPE daily to regulate intensity. Long: Follow program prescription in THR. Short: Start to go to gym with daughter Long: conitnue to improve stamina Short: Continue to increase workloads in relevance to chest pain symptoms Long: Continue to increase overall MET level ShorT: Start checking HR during exercise Long: Continue to execise independently at home Short: Continue to increase workloads. Long: Continue to improve strength and stamina.    [place] Name 11/18/22 1146 11/23/22 0738 12/02/22 1549 12/16/22 1308 12/24/22 0727     Exercise Goal Re-Evaluation   Exercise Goals Review Increase Physical Activity;Increase Strength and Stamina;Understanding of Exercise Prescription Increase Physical Activity;Increase Strength and Stamina;Understanding of Exercise Prescription Increase Physical Activity;Increase Strength and Stamina;Understanding of Exercise Prescription Increase Physical Activity;Increase Strength and Stamina;Understanding of Exercise Prescription Increase Physical Activity;Increase Strength and Stamina;Understanding of Exercise Prescription   Comments [name] continues to do well in rehab. She was out for several sessions due to having covid, but resumed back this week. She was able to increase her treadmill workload to 2.7 speed with a 2% incline. She is also now using 7 lbs for handweights. She continues to reach her THR. Will continue to monitor. [name] reports that she comes to cardiac rehab consistently and occationally exercises at a community gym with her daughter in law. She plans to join that gym so that she has somewhere to exercise after graduation from cardiac  rehab. She reports that she no longer is having cardiac symptoms and feels like she has more energy. Janace Hoard is doing well in rehab. She recently increased her overall average MET level to 4.58 METs. She also improved to level 7 on the XR machine. She increased her treadmill workload as well, to a speed of 3 mph and an incline of 2%. We will continue to monitor her progress in the program. [name] continues to do well in rehab. She has  stayed consistent working at level 7 on the T4 Nustep and walking 3.0/2% incline on the treadmill. We will encourage her to also increase her level on the XR. She did increase her handweights to 8 lbs! She is due for her post 6MWT this week and we hope to see significant improvement. We will continue to monitor. [name] improved her post 6MWT by 408 ft!!  She is planning to join a gym.  She will either go with her daughter in law at [employer] or [employer] with her husband.  She is feeling better overall and has more energy.   Expected Outcomes Short: Continue to increase treadmill incline Long: Continue to increase overall MET level and stamina Short: join community gym with daughter in law abd add 1-2 days of consistent exercise outside of rehab. Long: become independent with exercise routine. Short: Continue to increase treadmill workload. Long: Continue to improve strength and stamina. Short: Improve on post 6MWT Long: Continue to increase overall MET level and stamina Continue to exercise independently            Discharge Exercise Prescription (Final Exercise Prescription Changes):  Exercise Prescription Changes - 12/16/22 1300       Response to Exercise   Blood Pressure (Admit) 126/60    Blood Pressure (Exit) 134/72    Heart Rate (Admit) 88 bpm    Heart Rate (Exercise) 132 bpm    Heart Rate (Exit) 104 bpm    Rating of Perceived Exertion (Exercise) 15    Symptoms none    Duration Continue with 30 min of aerobic exercise without signs/symptoms of physical  distress.    Intensity THRR unchanged      Progression   Progression Continue to progress workloads to maintain intensity without signs/symptoms of physical distress.    Average METs 4.6      Resistance Training   Training Prescription Yes    Weight 8 lb    Reps 10-15      Interval Training   Interval Training No      Treadmill   MPH 3    Grade 2    Minutes 15    METs 4.12      NuStep   Level 7    Minutes 15    METs 4.7      REL-XR   Level 6    Minutes 15      Home Exercise Plan   Plans to continue exercise at [employer] (comment)   [employer]   Frequency Add 2 additional days to program exercise sessions.    Initial Home Exercises Provided 10/20/22      Oxygen   Maintain Oxygen Saturation 88% or higher             Nutrition:  Target Goals: Understanding of nutrition guidelines, daily intake of sodium '1500mg'$ , cholesterol '200mg'$ , calories 30% from fat and 7% or less from saturated fats, daily to have 5 or more servings of fruits and vegetables.  Education: All About Nutrition: -Group instruction provided by verbal, written material, interactive activities, discussions, models, and posters to present general guidelines for heart healthy nutrition including fat, fiber, MyPlate, the role of sodium in heart healthy nutrition, utilization of the nutrition label, and utilization of this knowledge for meal planning. Follow up email sent as well. Written material given at graduation. Flowsheet Row Cardiac Rehab from 12/17/2022 in [facility] Cardiac and Pulmonary Rehab  Education need identified 10/07/22       Biometrics:  Pre Biometrics -  10/07/22 1712       Pre Biometrics   Height 5' 8.3" (1.735 m)    Weight 250 lb 14.4 oz (113.8 kg)    Waist Circumference 54 inches    Hip Circumference 53.5 inches    Waist to Hip Ratio 1.01 %    BMI (Calculated) 37.81    Single Leg Stand 30 seconds             Post Biometrics - 12/17/22 0853        Post   Biometrics   Height 5' 8.3" (1.735 m)    Weight 255 lb 6.4 oz (115.8 kg)    Waist Circumference 43.5 inches    Hip Circumference 53.5 inches    Waist to Hip Ratio 0.81 %    BMI (Calculated) 38.49    Single Leg Stand 30 seconds             Nutrition Therapy Plan and Nutrition Goals:  Nutrition Therapy & Goals - 10/07/22 1722       Nutrition Therapy   Diet Heart healthy, low Na    Protein (specify units) 90-100g    Fiber 25 grams    Whole Grain Foods 3 servings    Saturated Fats 12 max. grams    Fruits and Vegetables 8 servings/day    Sodium 2 grams      Personal Nutrition Goals   Nutrition Goal ST: review handouts. fill up a large cup when she makes her coffee and then set alerts to remind her to drink. Try hidden vegetable recipes. LT: include at least 5 fruit/vegetable servings/day, include whole grains at least 1x/day, drink water throughout the day    Comments 52 y.o. F admitted for cardiac rehab. PMHx includes HTN, HLD, anxiety/depression, diverticulitis, GERD. Reviewed relevant medications and recent lab work. [name] reports that her biggest goals are inlcuding more fruits/vegetables and drinking enough fluids (specifically water). She has hemorrhoids and reports that when she does eat higher fiber food like whole wheat bread and peanut butter, she will be constipated - discussed how increasing fiber without increasing water can cause/worsen constipation. Reviewed diverticulitis MNT - discussed that high fiber foods can help to prevent flare ups. [name] reports that she has to foce herself to drink fluids during the day, she likes water, but has a hard time drinking it; she has tried to keep her water close to her and use a straw, but they have not helped - suggested that she could fill up a large cup when she makes her coffee and then set alerts to remind her to drink. [name] reports that she does not like most vegetables and has a hard time including them - discussed hidden  vegetables and search terms to find hidden vegetable recipes - provided handouts. Reviewed heart healthy eating.      Intervention Plan   Intervention Prescribe, educate and counsel regarding individualized specific dietary modifications aiming towards targeted core components such as weight, hypertension, lipid management, diabetes, heart failure and other comorbidities.;Nutrition handout(s) given to patient.    Expected Outcomes Short Term Goal: Understand basic principles of dietary content, such as calories, fat, sodium, cholesterol and nutrients.;Short Term Goal: A plan has been developed with personal nutrition goals set during dietitian appointment.;Long Term Goal: Adherence to prescribed nutrition plan.             Nutrition Assessments:  MEDIFICTS Score Key: ?70 Need to make dietary changes  40-70 Heart Healthy Diet ? 40 Therapeutic Level Cholesterol Diet  Flowsheet Row Cardiac Rehab from 12/22/2022 in [facility] Cardiac and Pulmonary Rehab  Picture Your Plate Total Score on Discharge 35      Picture Your Plate Scores: [id number] Unhealthy dietary pattern with much room for improvement. 41-50 Dietary pattern unlikely to meet recommendations for good health and room for improvement. 51-60 More healthful dietary pattern, with some room for improvement.  >60 Healthy dietary pattern, although there may be some specific behaviors that could be improved.    Nutrition Goals Re-Evaluation:  Nutrition Goals Re-Evaluation     Row Name 10/29/22 0729 11/23/22 0746 12/24/22 0732         Goals   Nutrition Goal ST: review handouts. fill up a large cup when she makes her coffee and then set alerts to remind her to drink. Try hidden vegetable recipes. LT: include at least 5 fruit/vegetable servings/day, include whole grains at least 1x/day, drink water throughout the day -- Short: replace Dr. Malachi Bonds with water and start using track it app to monitor food intake. Long: adapt to independently eating a  heart healthy diet.     Comment [name] admits she has not made many changes regarding her diet just yet but is motivated to make this her next change. She really struggles with her intake of water and may get a water bottle to mark it with amount of ozs and keep track throughout the day and make hourly goals. She really wants to start meal prepping with her husband for portion control. She also is going to try to work on reducing her sodium intake as she knows she eats too much but hasn't looked at the labels to see how much. [name] stated that nutrition is still the area she has not started working on as much. We discussed trying to make smaller changes to help her get motivated to start working on nutrition. She stated she will try to cut out Dr. Malachi Bonds and start drinking water, and to start tracking calories on her track it app to be more aware of what she is eating. Janace Hoard is nearing graduation.  She is still trying to cut back on her Dr. Malachi Bonds.  She is still drinking one each night.  She is working on her overall sugar and salt levels.  She knows that she needs to continue to work on her diet to really help with her weight loss.     Expected Outcome Short: Work on RD recommendations, focus on drinking more water Long: Continue to eat heart healthy exercise Short: replace Dr. Malachi Bonds with water and start using track it app to monitor food intake. Long: adapt to independently eating a heart healthy diet. Continue to focus on heart healthy eating and keeping reducing sugar and salt.              Nutrition Goals Discharge (Final Nutrition Goals Re-Evaluation):  Nutrition Goals Re-Evaluation - 12/24/22 0732       Goals   Nutrition Goal Short: replace Dr. Malachi Bonds with water and start using track it app to monitor food intake. Long: adapt to independently eating a heart healthy diet.    Comment [name] is nearing graduation.  She is still trying to cut back on her Dr. Malachi Bonds.  She is still drinking one each  night.  She is working on her overall sugar and salt levels.  She knows that she needs to continue to work on her diet to really help with her weight loss.    Expected Outcome Continue to focus on  heart healthy eating and keeping reducing sugar and salt.             Psychosocial: Target Goals: Acknowledge presence or absence of significant depression and/or stress, maximize coping skills, provide positive support system. Participant is able to verbalize types and ability to use techniques and skills needed for reducing stress and depression.   Education: Stress, Anxiety, and Depression - Group verbal and visual presentation to define topics covered.  Reviews how body is impacted by stress, anxiety, and depression.  Also discusses healthy ways to reduce stress and to treat/manage anxiety and depression.  Written material given at graduation. Flowsheet Row Cardiac Rehab from 12/17/2022 in [facility] Cardiac and Pulmonary Rehab  Date 10/29/22  Educator [facility]  Instruction Review Code 1- [employer] Understanding       Education: Sleep Hygiene -Provides group verbal and written instruction about how sleep can affect your health.  Define sleep hygiene, discuss sleep cycles and impact of sleep habits. Review good sleep hygiene tips.    Initial Review & Psychosocial Screening:  Initial Psych Review & Screening - 10/06/22 1444       Initial Review   Current issues with Current Sleep Concerns;Current Stress Concerns    Source of Stress Concerns Unable to participate in former interests or hobbies;Unable to perform yard/household activities      [place]? Yes   family     Barriers   Psychosocial barriers to participate in program There are no identifiable barriers or psychosocial needs.;The patient should benefit from training in stress management and relaxation.      Screening Interventions   Interventions Encouraged to exercise;Provide feedback about the scores to  participant;To provide support and resources with identified psychosocial needs    Expected Outcomes Short Term goal: Utilizing psychosocial counselor, staff and physician to assist with identification of specific Stressors or current issues interfering with healing process. Setting desired goal for each stressor or current issue identified.;Long Term Goal: Stressors or current issues are controlled or eliminated.;Short Term goal: Identification and review with participant of any Quality of Life or Depression concerns found by scoring the questionnaire.;Long Term goal: The participant improves quality of Life and PHQ9 Scores as seen by post scores and/or verbalization of changes             Quality of Life Scores:   Quality of Life - 12/22/22 0729       Quality of Life Scores   Health/Function Pre 12.33 %    Health/Function Post 20.97 %    Health/Function % Change 70.07 %    Socioeconomic Pre 24.25 %    Socioeconomic Post 23.57 %    Socioeconomic % Change  -2.8 %    Psych/Spiritual Pre 14.71 %    Psych/Spiritual Post 21.5 %    Psych/Spiritual % Change 46.16 %    Family Pre 22.8 %    Family Post 20.1 %    Family % Change -11.84 %    GLOBAL Pre 17.03 %    GLOBAL Post 21.49 %    GLOBAL % Change 26.19 %            Scores of 19 and below usually indicate a poorer quality of life in these areas.  A difference of  2-3 points is a clinically meaningful difference.  A difference of 2-3 points in the total score of the Quality of Life Index has been associated with significant improvement in overall quality of life, self-image, physical symptoms,  and general health in studies assessing change in quality of life.  PHQ-9: Review Flowsheet       12/22/2022 10/07/2022  Depression screen PHQ 2/9  Decreased Interest 0 1  Down, Depressed, Hopeless 0 1  PHQ - 2 Score 0 2  Altered sleeping 2 1  Tired, decreased energy 1 3  Change in appetite 3 1  Feeling bad or failure about yourself  0  0  Trouble concentrating 0 0  Moving slowly or fidgety/restless 0 0  Suicidal thoughts 0 0  PHQ-9 Score 6 7  Difficult doing work/chores Not difficult at all Somewhat difficult   Interpretation of Total Score  Total Score Depression Severity:  1-4 = Minimal depression, 5-9 = Mild depression, 10-14 = Moderate depression, 15-19 = Moderately severe depression, 20-27 = Severe depression   Psychosocial Evaluation and Intervention:  Psychosocial Evaluation - 10/06/22 1456       Psychosocial Evaluation & Interventions   Interventions Encouraged to exercise with the program and follow exercise prescription;Relaxation education;Stress management education    Comments Janace Hoard is coming to 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 She is still having some chest pain, which her doctors are aware. She has new medication since the MI that she is adjusting to. Since her MI, she has been having headaches which she doesn't know if its medication related to due to possible anemia. She has a long GI history, including diverticulitis and hemorrhoids. When asked about stress, anxiety, and depression, she admits that she has been feeling all those things since her MI last week She is more teary and feels like she can't keep up. She knows its temporary, but stated it hurts because she can't follow her two year old granddaughter around like she wants. She is very motivated to come to the program to start an exercise routine and to get help with nutrition. She is very proud that she quit smoking 06/25/22 cold [place] and has no desire to start back.    Expected Outcomes Short: attend cardiac rehab for education and exercise. Long: develop and maintain positive self care habits.    Continue Psychosocial Services  Follow up required by staff             Psychosocial Re-Evaluation:  Psychosocial Re-Evaluation     [place] Name 10/29/22 0735 11/23/22 0800 12/24/22 0729         Psychosocial Re-Evaluation   Current issues with  Current Stress Concerns;Current Sleep Concerns Current Stress Concerns;Current Sleep Concerns Current Stress Concerns;Current Sleep Concerns     Comments [name] states she is doing well mentally. She definitely experiences some stress through work but states she "thrives" on stress at work as it keeps her motivated. She did state that she feels she manages it well and not to the point where it is unbearable. She states she is not sleeping good, as she wakes up a lot during the night but can fall asleep quick. She hasn't contacted her doctor yet but admits she is on her phone a lot before bed. She was encouraged to contact her PCP to see if there is anything she is able to take and to also ask about a possible sleep study if warranted. She is enjoying the program so far but is frustrated she is still dealing with mild chest pain and hasn't had answers yet from a doctor as to what is causing it. They are still in the process of changing her medications. [name] reports that he main stress is health concerns that  she does not have definate answers for. She has an appointment with her cardiologist today and has a list of questions to discuss with the doctor. Her main stress is worry over what to look for in the future with reguards to symptoms coming back and being able to identify if she is having another heart event or medical emergency. She states she sleeps 6-7 hours a night. [name] is making plans for exercise for after graduation.  She is going to join a gym with a family memeber. Stress is increasing at work.  She was up until 1am with work on Tuesday.  She is now smoke free (>37month).  She is coping the best she can with work.  She is planning to say something to work about the unrealistic goals.     Expected Outcomes Short: Ask doctor about any possible sleep aids Long: Continue to maintain positive attitude Short: talk to doctor about symptom identification concersn. Long: continue good mental health habits.  Short: Cope with work Long: Make time to exercise and rest     Interventions Encouraged to attend Cardiac Rehabilitation for the exercise Encouraged to attend Cardiac Rehabilitation for the exercise Encouraged to attend Cardiac Rehabilitation for the exercise     Continue Psychosocial Services  Follow up required by staff Follow up required by staff --              Psychosocial Discharge (Final Psychosocial Re-Evaluation):  Psychosocial Re-Evaluation - 12/24/22 0729       Psychosocial Re-Evaluation   Current issues with Current Stress Concerns;Current Sleep Concerns    Comments [name] is making plans for exercise for after graduation.  She is going to join a gym with a family memeber. Stress is increasing at work.  She was up until 1am with work on Tuesday.  She is now smoke free (>652month.  She is coping the best she can with work.  She is planning to say something to work about the unrealistic goals.    Expected Outcomes Short: Cope with work Long: Make time to exercise and rest    Interventions Encouraged to attend Cardiac Rehabilitation for the exercise             Vocational Rehabilitation: Provide vocational rehab assistance to qualifying candidates.   Vocational Rehab Evaluation & Intervention:  Vocational Rehab - 10/06/22 1438       Initial Vocational Rehab Evaluation & Intervention   Assessment shows need for Vocational Rehabilitation No             Education: Education Goals: Education classes will be provided on a variety of topics geared toward better understanding of heart health and risk factor modification. Participant will state understanding/return demonstration of topics presented as noted by education test scores.  Learning Barriers/Preferences:  Learning Barriers/Preferences - 10/06/22 1436       Learning Barriers/Preferences   Learning Barriers None    Learning Preferences None             General Cardiac Education Topics:  AED/CPR: -  Group verbal and written instruction with the use of models to demonstrate the basic use of the AED with the basic ABC's of resuscitation.   Anatomy and Cardiac Procedures: - Group verbal and visual presentation and models provide information about basic cardiac anatomy and function. Reviews the testing methods done to diagnose heart disease and the outcomes of the test results. Describes the treatment choices: Medical Management, Angioplasty, or Coronary Bypass Surgery for treating various heart conditions including  Myocardial Infarction, Angina, Valve Disease, and Cardiac Arrhythmias.  Written material given at graduation. Flowsheet Row Cardiac Rehab from 12/17/2022 in [facility] Cardiac and Pulmonary Rehab  Date 12/03/22  Educator SB  Instruction Review Code 1- Verbalizes Understanding       Medication Safety: - Group verbal and visual instruction to review commonly prescribed medications for heart and lung disease. Reviews the medication, class of the drug, and side effects. Includes the steps to properly store meds and maintain the prescription regimen.  Written material given at graduation. Flowsheet Row Cardiac Rehab from 12/17/2022 in [facility] Cardiac and Pulmonary Rehab  Date 12/17/22  Educator [facility]  Instruction Review Code 1- Verbalizes Understanding       Intimacy: - Group verbal instruction through game format to discuss how heart and lung disease can affect sexual intimacy. Written material given at graduation..   Know Your Numbers and Heart Failure: - Group verbal and visual instruction to discuss disease risk factors for cardiac and pulmonary disease and treatment options.  Reviews associated critical values for Overweight/Obesity, Hypertension, Cholesterol, and Diabetes.  Discusses basics of heart failure: signs/symptoms and treatments.  Introduces Heart Failure Zone chart for action plan for heart failure.  Written material given at graduation. Flowsheet Row Cardiac Rehab from  12/17/2022 in [facility] Cardiac and Pulmonary Rehab  Date 10/15/22  Educator SB  Instruction Review Code 1- Verbalizes Understanding       Infection Prevention: - Provides verbal and written material to individual with discussion of infection control including proper hand washing and proper equipment cleaning during exercise session. Flowsheet Row Cardiac Rehab from 12/17/2022 in [facility] Cardiac and Pulmonary Rehab  Education need identified 10/07/22  Date 10/07/22  Educator [place]  Instruction Review Code 1- Verbalizes Understanding       Falls Prevention: - Provides verbal and written material to individual with discussion of falls prevention and safety. Flowsheet Row Cardiac Rehab from 12/17/2022 in [facility] Cardiac and Pulmonary Rehab  Education need identified 10/07/22  Date 10/07/22  Educator [place]  Instruction Review Code 1- Verbalizes Understanding       Other: -Provides group and verbal instruction on various topics (see comments) Flowsheet Row Cardiac Rehab from 12/17/2022 in [facility] Cardiac and Pulmonary Rehab  Date 12/10/22  Educator SB  Instruction Review Code 1- Verbalizes Understanding       Knowledge Questionnaire Score:  Knowledge Questionnaire Score - 12/22/22 0729       Knowledge Questionnaire Score   Pre Score 25/26    Post Score 26/26             Core Components/Risk Factors/Patient Goals at Admission:  Personal Goals and Risk Factors at Admission - 10/07/22 1717       Core Components/Risk Factors/Patient Goals on Admission    Weight Management Yes;Weight Loss;Obesity    Intervention Weight Management: Develop a combined nutrition and exercise program designed to reach desired caloric intake, while maintaining appropriate intake of nutrient and fiber, sodium and fats, and appropriate energy expenditure required for the weight goal.;Weight Management: Provide education and appropriate resources to help participant work on and attain dietary goals.;Weight  Management/Obesity: Establish reasonable short term and long term weight goals.;Obesity: Provide education and appropriate resources to help participant work on and attain dietary goals.    Admit Weight 250 lb (113.4 kg)    Goal Weight: Short Term 245 lb (111.1 kg)    Goal Weight: Long Term 180 lb (81.6 kg)   Per patient   Expected Outcomes Short Term: Continue to  assess and modify interventions until short term weight is achieved;Long Term: Adherence to nutrition and physical activity/exercise program aimed toward attainment of established weight goal;Weight Loss: Understanding of general recommendations for a balanced deficit meal plan, which promotes 1-2 lb weight loss per week and includes a negative energy balance of [phone] kcal/d;Understanding recommendations for meals to include 15-35% energy as protein, 25-35% energy from fat, 35-60% energy from carbohydrates, less than '200mg'$  of dietary cholesterol, 20-35 gm of total fiber daily;Understanding of distribution of calorie intake throughout the day with the consumption of 4-5 meals/snacks    Hypertension Yes    Intervention Monitor prescription use compliance.;Provide education on lifestyle modifcations including regular physical activity/exercise, weight management, moderate sodium restriction and increased consumption of fresh fruit, vegetables, and low fat dairy, alcohol moderation, and smoking cessation.    Expected Outcomes Short Term: Continued assessment and intervention until BP is < 140/32m HG in hypertensive participants. < 130/867mHG in hypertensive participants with diabetes, heart failure or chronic kidney disease.;Long Term: Maintenance of blood pressure at goal levels.    Lipids Yes    Intervention Provide education and support for participant on nutrition & aerobic/resistive exercise along with prescribed medications to achieve LDL '70mg'$ , HDL >'40mg'$ .    Expected Outcomes Short Term: Participant states understanding of desired  cholesterol values and is compliant with medications prescribed. Participant is following exercise prescription and nutrition guidelines.;Long Term: Cholesterol controlled with medications as prescribed, with individualized exercise RX and with personalized nutrition plan. Value goals: LDL < '70mg'$ , HDL > 40 mg.             Education:Diabetes - Individual verbal and written instruction to review signs/symptoms of diabetes, desired ranges of glucose level fasting, after meals and with exercise. Acknowledge that pre and post exercise glucose checks will be done for 3 sessions at entry of program.   Core Components/Risk Factors/Patient Goals Review:   Goals and Risk Factor Review     Row Name 10/29/22 0720 10/29/22 0734 11/23/22 0753 12/24/22 0734       Core Components/Risk Factors/Patient Goals Review   Personal Goals Review Tobacco Cessation Weight Management/Obesity;Hypertension;Lipids Weight Management/Obesity;Hypertension;Lipids Weight Management/Obesity;Hypertension;Lipids    Review [name] has quit smoking in September and she plans to stay that way. She has not had any tobacco products since she quit. She does not use any medications or suppliments to help her quit smoking. She has read a book that helped her quit and will stay that way. [name] has a BP at cuff at home to use but usually will only check it if she feels bad. Encouraged her to check more frequently at home. Her pressures are good at rehab. She checks her weight and has lost 2 lb since starting the program, but trying to lose more once she starts making he diet changes (drinking more water, reducing sodium, portion sizes). She is staying compliant with all of her medications. Her doctor recently made a medication switch to hopefully help relieve some mild chest pain she has been experiencing. [name] reported that he weight is up again. She is still working on reducing salt and feels like sometimes she has "water weight" after she  eats a lot of sodium. She reports that she takes all blood pressure and lipid meds as prescribed and continues to monitor risk factors. She reports monitoring weight at home and does have a BP cuff to check BP when she feels bad, but does not check in regularly. [name] has done well in rehab.  Her  weight is not where she wants it to be, but she is still not eating the best yet.  Her pressures are doing well.  She is scheduled for her PCP visit on 3/12 and will be getting blood work with lipids checked.  She is excited to see what her cholesterol number are now.  She is excited with her improvement overall and is no longer having chest pain.    Expected Outcomes Short: meet quit date in March. Long: stay tobacco free. Short:Continue to work on weight loss Long: continue to manage lifestyle risk factors Short:Continue to work on weight loss  and reduce sodium intake. Check BP at home consistently.  Long: continue to manage lifestyle risk factors. Continue to work on weight loss and monitor risk factors.             Core Components/Risk Factors/Patient Goals at Discharge (Final Review):   Goals and Risk Factor Review - 12/24/22 0734       Core Components/Risk Factors/Patient Goals Review   Personal Goals Review Weight Management/Obesity;Hypertension;Lipids    Review [name] has done well in rehab.  Her weight is not where she wants it to be, but she is still not eating the best yet.  Her pressures are doing well.  She is scheduled for her PCP visit on 3/12 and will be getting blood work with lipids checked.  She is excited to see what her cholesterol number are now.  She is excited with her improvement overall and is no longer having chest pain.    Expected Outcomes Continue to work on weight loss and monitor risk factors.             ITP Comments:  ITP Comments     Row Name 10/06/22 1502 10/07/22 1628 10/07/22 1718 10/07/22 1722 10/12/22 0756   ITP Comments Initial phone call completed.  Diagnosis can be found in [facility] 12/6. EP Orientation scheduled for Wednesday 12/13 at 2:30. Completed 6MWT and gym orientation. Initial ITP created and sent for review to Dr. Emily Filbert, Medical Director. [name] has recently quit tobacco use within the last 6 months. Her quite date was 06/26/22. Intervention for relapse prevention was provided at the initial medical review. He was encouraged to continue to with tobacco cessation and was provided information on relapse prevention. Patient received information about combination therapy, tobacco cessation classes, quit line, and quit smoking apps in case of a relapse. Patient demonstrated understanding of this material.Staff will continue to provide encouragement and follow up with the patient throughout the program. Patient was advised to follow up with Dr. Fletcher Anon first before returning to rehab. Per D/C notes, patient was supposed to have a hospital follow up. Patient is experiencing recurrent chest pain during activity. Small EKG changes noted today after 6MWT. Chest pain resolves at rest. EKG strips sent over to Dr. Fletcher Anon for review and will receive clearance for patient to return. Staff discussed with patient when to call 911. First full day of exercise!  Patient was oriented to gym and equipment including functions, settings, policies, and procedures.  Patient's individual exercise prescription and treatment plan were reviewed.  All starting workloads were established based on the results of the 6 minute walk test done at initial orientation visit.  The plan for exercise progression was also introduced and progression will be customized based on patient's performance and goals.    [place] Name 10/21/22 1007 10/29/22 0718 11/18/22 0915 12/16/22 1233 01/07/23 0724   ITP Comments 30 Day review completed. Medical  Director ITP review done, changes made as directed, and signed approval by Market researcher.     new to program [name] has quit smoking in September and she  plans to stay that way. She has not had any tobacco products since she quit. She does not use any medications or suppliments to help her quit smoking. She has read a book that helped her quit and will stay that way. 30 Day review completed. Medical Director ITP review done, changes made as directed, and signed approval by Medical Director. 30 day review completed. ITP sent to Dr. Emily Filbert, Medical Director of Cardiac Rehab. Continue with ITP unless changes are made by physician. Melynda graduated today from  rehab with 36 sessions completed.  Details of the patient's exercise prescription and what She needs to do in order to continue the prescription and progress were discussed with patient.  Patient was given a copy of prescription and goals.  Patient verbalized understanding.  Azelie plans to continue to exercise by [employer].            Comments: Discharge ITP

## 2023-01-07 NOTE — Progress Notes (Signed)
Discharge Summary: Andrea [name] (DOB: 18-Oct-1971)  [name] graduated today from  rehab with 36 sessions completed.  Details of the patient's exercise prescription and what She needs to do in order to continue the prescription and progress were discussed with patient.  Patient was given a copy of prescription and goals.  Patient verbalized understanding.  [name] plans to continue to exercise by [employer].   [place] Name 10/07/22 1711 12/17/22 0852       6 Minute Walk   Phase Initial Discharge    Distance 1215 feet 1623 feet    Distance % Change -- 33.58 %    Distance Feet Change -- 408 ft    Walk Time 6 minutes 6 minutes    # of Rest Breaks 0 0    MPH 2.3 3.07    METS 3.37 5.14    RPE 15 14    Perceived Dyspnea  3 --    VO2 Peak 11.81 17.98    Symptoms Yes (comment) No    Comments Chest pain 6/10 - resolved with rest, SOB --    Resting HR 65 bpm 100 bpm    Resting BP 122/72 134/70    Resting Oxygen Saturation  97 % 96 %    Exercise Oxygen Saturation  during 6 min walk 98 % 95 %    Max Ex. HR 111 bpm 139 bpm    Max Ex. BP 154/70 234/84    2 Minute Post BP 110/68 172/74

## 2023-01-07 NOTE — Progress Notes (Signed)
Daily Session Note  Patient Details  Name: Andrea [name] MRN: [id number] Date of Birth: Jan 21, 1971 Referring Provider:   Flowsheet Row Cardiac Rehab from 10/07/2022 in [facility]iac [facility]  Referring Provider Andrea Sacramento MD       Encounter Date: 01/07/2023  Check In:  Session Check In - 01/07/23 0723       Check-In   Supervising physician immediately available to respond to emergencies See telemetry face sheet for immediately available ER MD    Location [facility]-Cardiac & Pulmonary Rehab    Staff Present Andrea Russian, RN, ADN;Andrea Luan Pulling, MA, RCEP, CCRP, Andrea Gala, MS, ACSM CEP, Exercise Physiologist;Andrea [name], [place]    Virtual Visit No    Medication changes reported     No    Fall or balance concerns reported    No    Warm-up and Cool-down Performed on first and last piece of equipment    Resistance Training Performed Yes    VAD Patient? No    PAD/SET Patient? No      Pain Assessment   Currently in Pain? No/denies                Social History   Tobacco Use  Smoking Status Former   Packs/day: 0.75   Years: 35.00   Additional pack years: 0.00   Total pack years: 26.25   Types: Cigarettes   Quit date: 06/25/2022   Years since quitting: 0.5  Smokeless Tobacco Never    Goals Met:  Independence with exercise equipment Exercise tolerated well No report of concerns or symptoms today Strength training completed today  Goals Unmet:  Not Applicable  Comments:  Andrea [name] graduated today from  rehab with 36 sessions completed.  Details of the patient's exercise prescription and what She needs to do in order to continue the prescription and progress were discussed with patient.  Patient was given a copy of prescription and goals.  Patient verbalized understanding.  Andrea [name] plans to continue to exercise by [employer].    Dr. Emily [name] is Medical Director for [facility].  Dr. Ottie [name] is Medical  Director for [facility] Pulmonary Rehabilitation.

## 2023-01-11 ENCOUNTER — Other Ambulatory Visit: Payer: Self-pay | Admitting: Internal Medicine

## 2023-01-11 ENCOUNTER — Encounter: Payer: Self-pay | Admitting: Medical

## 2023-01-11 DIAGNOSIS — J439 Emphysema, unspecified: Secondary | ICD-10-CM

## 2023-01-11 DIAGNOSIS — Z87891 Personal history of nicotine dependence: Secondary | ICD-10-CM

## 2023-01-22 ENCOUNTER — Inpatient Hospital Stay: Admission: RE | Admit: 2023-01-22 | Payer: 59 | Source: Ambulatory Visit

## 2023-01-29 ENCOUNTER — Ambulatory Visit
Admission: RE | Admit: 2023-01-29 | Discharge: 2023-01-29 | Disposition: A | Discharge status: Home / Self Care | Payer: 59 | Source: Ambulatory Visit | Attending: Internal Medicine | Admitting: Internal Medicine

## 2023-01-29 DIAGNOSIS — J439 Emphysema, unspecified: Secondary | ICD-10-CM

## 2023-01-29 DIAGNOSIS — Z87891 Personal history of nicotine dependence: Secondary | ICD-10-CM

## 2023-02-02 ENCOUNTER — Ambulatory Visit (INDEPENDENT_AMBULATORY_CARE_PROVIDER_SITE_OTHER): Payer: 59

## 2023-02-02 DIAGNOSIS — Z87891 Personal history of nicotine dependence: Secondary | ICD-10-CM | POA: Diagnosis not present

## 2023-02-02 DIAGNOSIS — Z13820 Encounter for screening for osteoporosis: Secondary | ICD-10-CM | POA: Diagnosis not present

## 2023-02-02 DIAGNOSIS — Z780 Asymptomatic menopausal state: Secondary | ICD-10-CM | POA: Diagnosis not present

## 2023-02-04 ENCOUNTER — Encounter: Payer: Self-pay | Admitting: Cardiovascular Disease

## 2023-02-04 NOTE — Progress Notes (Signed)
Cardiology Office Note:    Date:  02/05/2023   ID:  Andrea, [name] 1971/09/01, MRN [id number]  PCP:  Ralene Ok, MD  Cardiologist:  Kristeen Miss, MD    Referring MD: Ralene Ok, MD   Chief Complaint  Patient presents with   Hyperlipidemia   Palpitations   Problem List   1. Essential hypertension History of chest pain Dizziness   Prior notes:    Andrea [name] is a 52 y.o. female with a hx of Hypertension who presents today for episodes of chest tightness and dizziness.  Andrea [name] is seen for the first time today  She has had some symptoms of diaphoresis on occasion.   Has lightheadedness - has been diagnosed with benign positional vertigo . Occurs randomly  - does not occur regularly . Last year she was exercising more and had lots of dizziness Had stopped smoking , lost 35 lbs But relapsed, restarted smoking in Feb.    Has some episodes of chest tightness.   Has been diagnosied with HTN  - tried [place] - HCT but she had hypotension.     Has   Has been on phentermine for about a year.   September 15, 2017:  Andrea [name] is seen back today for follow-up visit. She was seen several months ago with some chest pain.  A coronary calcium score is 0. Coronary CT angiogram was normal. She was also having some palpitations that were probably related to phentermine. Has had these palps for years.  Has worn a monitir . Still has fleeting pains.  Still has vertigo .  Has been to ENT.   Needs to go again  BP is elevated  Her primary MD has started her on a BP med but she has not picked it up Still smoking  Not exercising yet  Echocardiogram showed normal left ventricular systolic function with EF of 60-65%.  She has grade 1 diastolic dysfunction.  She has trivial aortic insufficiency.  February 05, 2021:  Andrea [name] is seen today for follow up of her CP. Coronary calcium score was 0 . Has some palpitations - which were likely due to phentermine.  Still smoking  - we  discussed at length   Has some intermittant gas pain   Has been exercising ( boot camp)  Lost 20 lbs . But she still smokes.  Gets lightheaded toward the end of the boot camp.  She is noticed that her heart rate seems to be a little bit fast.  Even when she is out walking in the park with her husband her Fitbit recorded a heart rate of over 200.  HR will occasionaly spike up to 130s even while she is sitting  Seems to occur after eating    May 13, 2021:  Andrea [name] is seen today for follow up of her palpitations CCA = 0. Still smoking , perhaps 1/2 ppd   Wt is 229 lbs.   Has stopped exercising . Has a new job - still at [employer]   Her palpitations are better on the metoprolol  Sometimes she skips the am dose. She does not eat breakfast   Aug. 4, 2023 Andrea [name] is seen for follow up of her palpitations CAC = 0 Wt is 241 lbs ( up 12 lb)   Still smoking  , less than 1/2 ppd  Screening CT scan shows early COPD   Long discussion about smoking cessation .  We discussed nicotine inhalers.   Has HLD, is not on any  lipid meds  Will check lipids today   February 05, 2023 Andrea [name] is seen today for follow-up visit.  She has hyperlipidemia, history of cigarette smoking, mild COPD.  Her coronary calcium score is 0. Wt is 257 lbs   She was recently admitted to [facility] hospital with  chest pain and pressure.  She had a temperature of 100.7. Troponin was 396. Troponin trend was relatively flat and remained elevated 396, 347, 352 Heart catheterization ultimately showed normal coronary arteries.  Echocardiogram reveals normal LVEF of 60 to 65%.  She has moderate left ventricular hypertrophy.  There is grade 1 diastolic dysfunction.  RV size and function were normal.  CP occurred for days , did not let up  Had fevers / chills   Was negative for COVID, flu,   Symptoms may have been myocarditis  Will get a cardiac MRI   No COVID boosters  Had COVID several months  prior   No CP currently , Is generally feeling better  Has quit smoking   Is having rectal bleeding  Hemorrhoidal bleeding  She will need cardiac clearance  Will wait until after the cardiac MRI before clearing her   We discussed weight loss  Is having some palpitation She felt better on metoprolol than on the Dilt   Blood pressure remains mildly elevated.  Will start her on losartan 50 mg a day.        Past Medical History:  Diagnosis Date   Abnormal Pap smear of cervix    2010-2014 abnormal pap every 3-6 months   dysplastic squamous cells of cervix    Anxiety    occ.- no meds   Blood in stool    Depression    no meds   Diverticulitis 08/2019   DUB (dysfunctional uterine bleeding)    Dyslipidemia    Dyspnea    Resolved - due to anemia - no current problems   Eczema    Endometriosis    Epigastric pain    Fatigue    Fibroid    GERD (gastroesophageal reflux disease)    Grade I diastolic dysfunction 06/25/2017   Noted on ECHO   Hemorrhoid    2nd and 3rd degree   History of adenomatous polyp of colon    History of palpitations    HSV infection    Hx of dysplastic nevus 07/17/2019   L upper back 4.0cm lateral to spine, moderate   Hyperlipidemia    diet controlled - no meds   Hypertension    not currently taking medication   Infertility, female    Iron deficiency anemia    Last iron infusion 09/14/2018   Left groin hernia    history of   Muscle cramping    with exertion   Myocardial infarction    Mild per pt   Obesity    Palpitations    Recurrent bacterial infection    Vaginal   Restless leg    due to anemia   Smoker    SVD (spontaneous vaginal delivery)    x 2   Thoracic back pain    while trying to sleep   Tinnitus, left    hears heart beat, MRI normal   Vertigo 2018    Past Surgical History:  Procedure Laterality Date   ABLATION ON ENDOMETRIOSIS  1995   anemia     due to gyn blood losses from uterine fibroids.  received PRBCs and iron  infusions.     CERVICAL BIOPSY  W/ LOOP  ELECTRODE EXCISION     CERVICAL CONIZATION W/BX N/A 09/20/2018   Procedure: CONIZATION CERVIX WITH BIOPSY;  Surgeon: Patton Salles, MD;  Location: [facility];  Service: Gynecology;  Laterality: N/A;   COLONOSCOPY  06/2018   Dr Ritta Slot.  9 mm sessile, hyperplastic sigmoid polyp.  diverticulosis.  int hemorrhoids.  2 cm cecal lipoma   COLPOSCOPY     x 3   CYSTOSCOPY N/A 11/14/2018   Procedure: CYSTOSCOPY;  Surgeon: Patton Salles, MD;  Location: [facility];  Service: Gynecology;  Laterality: N/A;   DILATION AND CURETTAGE OF UTERUS  1993   DILATION AND CURETTAGE OF UTERUS N/A 09/20/2018   Procedure: FRACTIONAL DILATATION AND CURETTAGE;  Surgeon: Patton Salles, MD;  Location: [facility];  Service: Gynecology;  Laterality: N/A;   GYNECOLOGIC CRYOSURGERY     HEMORRHOID BANDING     several   HERNIA REPAIR Left    childhood   INTRAUTERINE DEVICE (IUD) INSERTION  2013    (Mirena)   INTRAUTERINE DEVICE INSERTION  2000   [facility]   IUD REMOVAL  2010   IUD REMOVAL  2018   LEFT HEART CATH AND CORONARY ANGIOGRAPHY N/A 10/02/2022   Procedure: LEFT HEART CATH AND CORONARY ANGIOGRAPHY;  Surgeon: Iran Ouch, MD;  Location: [facility];  Service: Cardiovascular;  Laterality: N/A;   TOTAL LAPAROSCOPIC HYSTERECTOMY WITH SALPINGECTOMY Bilateral 11/14/2018   Procedure: TOTAL LAPAROSCOPIC HYSTERECTOMY WITH BILATERAL SALPINGECTOMY AND LYSIS OF ADHESIONS;  Surgeon: Patton Salles, MD;  Location: [facility];  Service: Gynecology;  Laterality: Bilateral;   TUBAL LIGATION     2010   UPPER GI ENDOSCOPY  06/2018   x 2   WISDOM TOOTH EXTRACTION      Current Medications: Current Meds  Medication Sig   acetaminophen (TYLENOL) 325 MG tablet Take 2 tablets (650 mg total) by mouth every 6 (six) hours as needed for mild pain (or temp > 100).   aspirin EC 81 MG tablet Take 1 tablet (81 mg  total) by mouth daily. Swallow whole.   atorvastatin (LIPITOR) 40 MG tablet Take 1 tablet (40 mg total) by mouth every evening. (Patient taking differently: Take 40 mg by mouth in the morning.)   clopidogrel (PLAVIX) 75 MG tablet Take 1 tablet (75 mg total) by mouth daily with breakfast.   fenofibrate 160 MG tablet Take 1 tablet (160 mg total) by mouth daily.   hydrocortisone (ANUSOL-HC) 2.5 % rectal cream    losartan (COZAAR) 50 MG tablet Take 1 tablet (50 mg total) by mouth daily.   losartan (COZAAR) 50 MG tablet Take 1 tablet (50 mg total) by mouth daily.   metoprolol succinate (TOPROL-XL) 50 MG 24 hr tablet Take 1 tablet (50 mg total) by mouth daily. Take with or immediately following a meal.   metoprolol succinate (TOPROL-XL) 50 MG 24 hr tablet Take 1 tablet (50 mg total) by mouth daily. Take with or immediately following a meal.   pantoprazole (PROTONIX) 40 MG tablet Take 1 tablet (40 mg total) by mouth daily.   [DISCONTINUED] diltiazem (CARDIZEM CD) 120 MG 24 hr capsule Take 1 capsule (120 mg total) by mouth daily.     Allergies:   Patient has no known allergies.   Social History   Socioeconomic History   Marital status: Married    Spouse name: Not on file   Number of children: 2   Years of education: Not  on file   Highest education level: Not on file  Occupational History   Not on file  Tobacco Use   Smoking status: Former    Packs/day: 0.75    Years: 35.00    Additional pack years: 0.00    Total pack years: 26.25    Types: Cigarettes    Quit date: 06/25/2022    Years since quitting: 0.6   Smokeless tobacco: Never  Vaping Use   Vaping Use: Never used  Substance and Sexual Activity   Alcohol use: Yes    Comment: 4-5 drinks a week (alcohol)  Red Bull and Vodka or red wine   Drug use: Never   Sexual activity: Yes    Partners: Male    Birth control/protection: Surgical    Comment: Hyst  Other Topics Concern   Not on file  Social History Narrative   Not on file    Social Determinants of Health   Financial Resource Strain: Not on file  Food Insecurity: No Food Insecurity (09/30/2022)   Hunger Vital Sign    Worried About Running Out of Food in the Last Year: Never true    Ran Out of Food in the Last Year: Never true  Transportation Needs: No Transportation Needs (09/30/2022)   PRAPARE - Administrator, Civil Service (Medical): No    Lack of Transportation (Non-Medical): No  Physical Activity: Not on file  Stress: Not on file  Social Connections: Not on file     Family History: The patient's family history includes Alzheimer's disease in her maternal grandfather; Cancer (age of onset: 48) in her mother; Heart disease in her father and paternal grandfather; Heart failure in her paternal aunt; Other in her brother, maternal grandfather, paternal grandfather, and paternal grandmother. ROS:   Please see the history of present illness.     All other systems reviewed and are negative.  EKGs/Labs/Other Studies Reviewed:    The following studies were reviewed today:   EKG:    Recent Labs: 09/30/2022: ALT 16 10/02/2022: BUN 12; Creatinine, Ser 0.68; Potassium 4.1; Sodium 139 10/09/2022: Hemoglobin 10.8; Platelets 332  Recent Lipid Panel    Component Value Date/Time   CHOL 200 09/30/2022 1442   CHOL 245 (H) 05/29/2022 1544   TRIG 229 (H) 09/30/2022 1442   HDL 43 09/30/2022 1442   HDL 39 (L) 05/29/2022 1544   CHOLHDL 4.7 09/30/2022 1442   VLDL 46 (H) 09/30/2022 1442   LDLCALC 111 (H) 09/30/2022 1442   LDLCALC 103 (H) 05/29/2022 1544      Physical Exam: Blood pressure 138/88, pulse 88, height 5\' 7"  (1.702 m), weight 257 lb 9.6 oz (116.8 kg), SpO2 97 %.       GEN:  Well nourished, well developed in no acute distress HEENT: Normal NECK: No JVD; No carotid bruits LYMPHATICS: No lymphadenopathy CARDIAC: RRR , no murmurs, rubs, gallops RESPIRATORY:  Clear to auscultation without rales, wheezing or rhonchi  ABDOMEN: Soft,  non-tender, non-distended MUSCULOSKELETAL:  No edema; No deformity  SKIN: Warm and dry NEUROLOGIC:  Alert and oriented x 3  February 05, 2023: Normal sinus rhythm at 86.  Nonspecific T wave wave abnormalities.   ASSESSMENT:    1. NSTEMI (non-ST elevated myocardial infarction)   2. Essential hypertension   3. Mixed hyperlipidemia   4. Morbid obesity      PLAN:       1.   Chest tightness:    Possible myocarditis: She had a normal coronary CT angiogram.  Coronary calcium score is 0.  She recently presented to the hospital and was thought to have a non-ST segment elevation myocardial infarction.  Heart catheterization revealed smooth and normal coronary arteries.  She was thought to perhaps have coronary spasm.  She did stop smoking several months prior.  Her symptoms preceded her hospitalization for for 5 days.  I think that her symptoms are more consistent with myocarditis.  Will get a cardiac MRI.   2.  Palpitations:       Palpitations have returned.  She is not well-controlled on diltiazem.  Would stop the diltiazem and start her on metoprolol succinate 50 mg a day.  3.  Obesity :     4.  Hypertension: Blood pressure is mildly elevated.  Will start her on losartan 50 mg a day.  Medication Adjustments/Labs and Tests Ordered: Current medicines are reviewed at length with the patient today.  Concerns regarding medicines are outlined above.  Orders Placed This Encounter  Procedures   MR CARDIAC MORPHOLOGY W WO CONTRAST   CBC   EKG 12-Lead    Meds ordered this encounter  Medications   metoprolol succinate (TOPROL-XL) 50 MG 24 hr tablet    Sig: Take 1 tablet (50 mg total) by mouth daily. Take with or immediately following a meal.    Dispense:  30 tablet    Refill:  0   losartan (COZAAR) 50 MG tablet    Sig: Take 1 tablet (50 mg total) by mouth daily.    Dispense:  30 tablet    Refill:  0   losartan (COZAAR) 50 MG tablet    Sig: Take 1 tablet (50 mg total) by mouth daily.     Dispense:  90 tablet    Refill:  3   metoprolol succinate (TOPROL-XL) 50 MG 24 hr tablet    Sig: Take 1 tablet (50 mg total) by mouth daily. Take with or immediately following a meal.    Dispense:  90 tablet    Refill:  3       Signed, Kristeen MissPhilip Shelbia Scinto, MD  02/05/2023 1:24 PM    [facility]

## 2023-02-05 ENCOUNTER — Encounter: Payer: Self-pay | Admitting: Cardiovascular Disease

## 2023-02-05 ENCOUNTER — Ambulatory Visit: Payer: 59 | Attending: Cardiovascular Disease | Admitting: Cardiovascular Disease

## 2023-02-05 ENCOUNTER — Other Ambulatory Visit: Payer: Self-pay | Admitting: Cardiovascular Disease

## 2023-02-05 VITALS — BP 138/88 | HR 88 | Ht 67.0 in | Wt 257.6 lb

## 2023-02-05 DIAGNOSIS — E782 Mixed hyperlipidemia: Secondary | ICD-10-CM

## 2023-02-05 DIAGNOSIS — I214 Non-ST elevation (NSTEMI) myocardial infarction: Secondary | ICD-10-CM

## 2023-02-05 DIAGNOSIS — I10 Essential (primary) hypertension: Secondary | ICD-10-CM

## 2023-02-05 MED ORDER — METOPROLOL SUCCINATE ER 50 MG PO TB24: 50.0000 mg | ORAL_TABLET | Freq: Every day | ORAL | 0 refills | Status: DC

## 2023-02-05 MED ORDER — LOSARTAN POTASSIUM 50 MG PO TABS: 50.0000 mg | ORAL_TABLET | Freq: Every day | ORAL | 3 refills | Status: DC

## 2023-02-05 MED ORDER — LOSARTAN POTASSIUM 50 MG PO TABS: 50.0000 mg | ORAL_TABLET | Freq: Every day | ORAL | 0 refills | Status: DC

## 2023-02-05 MED ORDER — METOPROLOL SUCCINATE ER 50 MG PO TB24: 50.0000 mg | ORAL_TABLET | Freq: Every day | ORAL | 3 refills | Status: DC

## 2023-02-05 NOTE — Patient Instructions (Addendum)
Medication Instructions:  Your physician has recommended you make the following change in your medication:   Stop taking Diltiazem  Start taking metoprolol succ 50 mg daily Start taking Losartan 50 mg daily *If you need a refill on your cardiac medications before your next appointment, please call your pharmacy*   Lab Work: CBC prior to MRN If you have labs (blood work) drawn today and your tests are completely normal, you will receive your results only by: MyChart Message (if you have MyChart) OR A paper copy in the mail If you have any lab test that is abnormal or we need to change your treatment, we will call you to review the results.   Testing/Procedures: Your physician has requested that you have a cardiac MRI. Cardiac MRI uses a computer to create images of your heart as its beating, producing both still and moving pictures of your heart and major blood vessels. For further information please visit [email or web address]. Please follow the instruction sheet given to you today for more information.    Follow-Up: At [facility], you and your health needs are our priority.  As part of our continuing mission to provide you with exceptional heart care, we have created designated Provider Care Teams.  These Care Teams include your primary Cardiologist (physician) and Advanced Practice Providers (APPs -  Physician Assistants and Nurse Practitioners) who all work together to provide you with the care you need, when you need it.   Your next appointment:   3 month(s)  Provider:   Kristeen Miss, MD     Other  Mediterranean Diet A Mediterranean diet refers to food and lifestyle choices that are based on the traditions of countries located on the [place]. It focuses on eating more fruits, vegetables, whole grains, beans, nuts, seeds, and heart-healthy fats, and eating less dairy, meat, eggs, and processed foods with added sugar, salt, and fat. This way of eating has been  shown to help prevent certain conditions and improve outcomes for people who have chronic diseases, like kidney disease and heart disease. What are tips for following this plan? Reading food labels Check the serving size of packaged foods. For foods such as rice and pasta, the serving size refers to the amount of cooked product, not dry. Check the total fat in packaged foods. Avoid foods that have saturated fat or trans fats. Check the ingredient list for added sugars, such as corn syrup. Shopping  Buy a variety of foods that offer a balanced diet, including: Fresh fruits and vegetables (produce). Grains, beans, nuts, and seeds. Some of these may be available in unpackaged forms or large amounts (in bulk). Fresh seafood. Poultry and eggs. Low-fat dairy products. Buy whole ingredients instead of prepackaged foods. Buy fresh fruits and vegetables in-season from local farmers markets. Buy plain frozen fruits and vegetables. If you do not have access to quality fresh seafood, buy precooked frozen shrimp or canned fish, such as tuna, salmon, or sardines. Stock your pantry so you always have certain foods on hand, such as olive oil, canned tuna, canned tomatoes, rice, pasta, and beans. Cooking Cook foods with extra-virgin olive oil instead of using butter or other vegetable oils. Have meat as a side dish, and have vegetables or grains as your main dish. This means having meat in small portions or adding small amounts of meat to foods like pasta or stew. Use beans or vegetables instead of meat in common dishes like chili or lasagna. Experiment with different cooking methods. Try roasting,  broiling, steaming, and sauting vegetables. Add frozen vegetables to soups, stews, pasta, or rice. Add nuts or seeds for added healthy fats and plant protein at each meal. You can add these to yogurt, salads, or vegetable dishes. Marinate fish or vegetables using olive oil, lemon juice, garlic, and fresh  herbs. Meal planning Plan to eat one vegetarian meal one day each week. Try to work up to two vegetarian meals, if possible. Eat seafood two or more times a week. Have healthy snacks readily available, such as: Vegetable sticks with hummus. Greek yogurt. Fruit and nut trail mix. Eat balanced meals throughout the week. This includes: Fruit: 2-3 servings a day. Vegetables: 4-5 servings a day. Low-fat dairy: 2 servings a day. Fish, poultry, or lean meat: 1 serving a day. Beans and legumes: 2 or more servings a week. Nuts and seeds: 1-2 servings a day. Whole grains: 6-8 servings a day. Extra-virgin olive oil: 3-4 servings a day. Limit red meat and sweets to only a few servings a month. Lifestyle  Cook and eat meals together with your family, when possible. Drink enough fluid to keep your urine pale yellow. Be physically active every day. This includes: Aerobic exercise like running or swimming. Leisure activities like gardening, walking, or housework. Get 7-8 hours of sleep each night. If recommended by your health care provider, drink red wine in moderation. This means 1 glass a day for nonpregnant women and 2 glasses a day for men. A glass of wine equals 5 oz (150 mL). What foods should I eat? Fruits Apples. Apricots. Avocado. Berries. Bananas. Cherries. Dates. Figs. Grapes. Lemons. Melon. Oranges. Peaches. Plums. Pomegranate. Vegetables Artichokes. Beets. Broccoli. Cabbage. Carrots. Eggplant. Green beans. Chard. Kale. Spinach. Onions. Leeks. Peas. Squash. Tomatoes. Peppers. Radishes. Grains Whole-grain pasta. Brown rice. Bulgur wheat. Polenta. Couscous. Whole-wheat bread. Orpah Cobb. Meats and other proteins Beans. Almonds. Sunflower seeds. Pine nuts. Peanuts. Cod. Salmon. Scallops. Shrimp. Tuna. Tilapia. Clams. Oysters. Eggs. Poultry without skin. Dairy Low-fat milk. Cheese. Greek yogurt. Fats and oils Extra-virgin olive oil. Avocado oil. Grapeseed oil. Beverages Water.  Red wine. Herbal tea. Sweets and desserts Greek yogurt with honey. Baked apples. Poached pears. Trail mix. Seasonings and condiments Basil. Cilantro. Coriander. Cumin. Mint. Parsley. Sage. Rosemary. Tarragon. Garlic. Oregano. Thyme. Pepper. Balsamic vinegar. Tahini. Hummus. Tomato sauce. Olives. Mushrooms. The items listed above may not be a complete list of foods and beverages you can eat. Contact a dietitian for more information. What foods should I limit? This is a list of foods that should be eaten rarely or only on special occasions. Fruits Fruit canned in syrup. Vegetables Deep-fried potatoes (french fries). Grains Prepackaged pasta or rice dishes. Prepackaged cereal with added sugar. Prepackaged snacks with added sugar. Meats and other proteins Beef. Pork. Lamb. Poultry with skin. Hot dogs. Tomasa Blase. Dairy Ice cream. Sour cream. Whole milk. Fats and oils Butter. Canola oil. Vegetable oil. Beef fat (tallow). Lard. Beverages Juice. Sugar-sweetened soft drinks. Beer. Liquor and spirits. Sweets and desserts Cookies. Cakes. Pies. Candy. Seasonings and condiments Mayonnaise. Pre-made sauces and marinades. The items listed above may not be a complete list of foods and beverages you should limit. Contact a dietitian for more information. Summary The Mediterranean diet includes both food and lifestyle choices. Eat a variety of fresh fruits and vegetables, beans, nuts, seeds, and whole grains. Limit the amount of red meat and sweets that you eat. If recommended by your health care provider, drink red wine in moderation. This means 1 glass a day for nonpregnant women  and 2 glasses a day for men. A glass of wine equals 5 oz (150 mL). This information is not intended to replace advice given to you by your health care provider. Make sure you discuss any questions you have with your health care provider. Document Revised: 11/17/2019 Document Reviewed: 09/14/2019 Elsevier Patient Education   2023 [employer] Mediterranean Diet A Mediterranean diet refers to food and lifestyle choices that are based on the traditions of countries located on the [employer]. It focuses on eating more fruits, vegetables, whole grains, beans, nuts, seeds, and heart-healthy fats, and eating less dairy, meat, eggs, and processed foods with added sugar, salt, and fat. This way of eating has been shown to help prevent certain conditions and improve outcomes for people who have chronic diseases, like kidney disease and heart disease. What are tips for following this plan? Reading food labels Check the serving size of packaged foods. For foods such as rice and pasta, the serving size refers to the amount of cooked product, not dry. Check the total fat in packaged foods. Avoid foods that have saturated fat or trans fats. Check the ingredient list for added sugars, such as corn syrup. Shopping  Buy a variety of foods that offer a balanced diet, including: Fresh fruits and vegetables (produce). Grains, beans, nuts, and seeds. Some of these may be available in unpackaged forms or large amounts (in bulk). Fresh seafood. Poultry and eggs. Low-fat dairy products. Buy whole ingredients instead of prepackaged foods. Buy fresh fruits and vegetables in-season from local farmers markets. Buy plain frozen fruits and vegetables. If you do not have access to quality fresh seafood, buy precooked frozen shrimp or canned fish, such as tuna, salmon, or sardines. Stock your pantry so you always have certain foods on hand, such as olive oil, canned tuna, canned tomatoes, rice, pasta, and beans. Cooking Cook foods with extra-virgin olive oil instead of using butter or other vegetable oils. Have meat as a side dish, and have vegetables or grains as your main dish. This means having meat in small portions or adding small amounts of meat to foods like pasta or stew. Use beans or vegetables instead of meat in common dishes  like chili or lasagna. Experiment with different cooking methods. Try roasting, broiling, steaming, and sauting vegetables. Add frozen vegetables to soups, stews, pasta, or rice. Add nuts or seeds for added healthy fats and plant protein at each meal. You can add these to yogurt, salads, or vegetable dishes. Marinate fish or vegetables using olive oil, lemon juice, garlic, and fresh herbs. Meal planning Plan to eat one vegetarian meal one day each week. Try to work up to two vegetarian meals, if possible. Eat seafood two or more times a week. Have healthy snacks readily available, such as: Vegetable sticks with hummus. Greek yogurt. Fruit and nut trail mix. Eat balanced meals throughout the week. This includes: Fruit: 2-3 servings a day. Vegetables: 4-5 servings a day. Low-fat dairy: 2 servings a day. Fish, poultry, or lean meat: 1 serving a day. Beans and legumes: 2 or more servings a week. Nuts and seeds: 1-2 servings a day. Whole grains: 6-8 servings a day. Extra-virgin olive oil: 3-4 servings a day. Limit red meat and sweets to only a few servings a month. Lifestyle  Cook and eat meals together with your family, when possible. Drink enough fluid to keep your urine pale yellow. Be physically active every day. This includes: Aerobic exercise like running or swimming. Leisure activities like gardening, walking,  or housework. Get 7-8 hours of sleep each night. If recommended by your health care provider, drink red wine in moderation. This means 1 glass a day for nonpregnant women and 2 glasses a day for men. A glass of wine equals 5 oz (150 mL). What foods should I eat? Fruits Apples. Apricots. Avocado. Berries. Bananas. Cherries. Dates. Figs. Grapes. Lemons. Melon. Oranges. Peaches. Plums. Pomegranate. Vegetables Artichokes. Beets. Broccoli. Cabbage. Carrots. Eggplant. Green beans. Chard. Kale. Spinach. Onions. Leeks. Peas. Squash. Tomatoes. Peppers.  Radishes. Grains Whole-grain pasta. Brown rice. Bulgur wheat. Polenta. Couscous. Whole-wheat bread. Orpah Cobb. Meats and other proteins Beans. Almonds. Sunflower seeds. Pine nuts. Peanuts. Cod. Salmon. Scallops. Shrimp. Tuna. Tilapia. Clams. Oysters. Eggs. Poultry without skin. Dairy Low-fat milk. Cheese. Greek yogurt. Fats and oils Extra-virgin olive oil. Avocado oil. Grapeseed oil. Beverages Water. Red wine. Herbal tea. Sweets and desserts Greek yogurt with honey. Baked apples. Poached pears. Trail mix. Seasonings and condiments Basil. Cilantro. Coriander. Cumin. Mint. Parsley. Sage. Rosemary. Tarragon. Garlic. Oregano. Thyme. Pepper. Balsamic vinegar. Tahini. Hummus. Tomato sauce. Olives. Mushrooms. The items listed above may not be a complete list of foods and beverages you can eat. Contact a dietitian for more information. What foods should I limit? This is a list of foods that should be eaten rarely or only on special occasions. Fruits Fruit canned in syrup. Vegetables Deep-fried potatoes (french fries). Grains Prepackaged pasta or rice dishes. Prepackaged cereal with added sugar. Prepackaged snacks with added sugar. Meats and other proteins Beef. Pork. Lamb. Poultry with skin. Hot dogs. Tomasa Blase. Dairy Ice cream. Sour cream. Whole milk. Fats and oils Butter. Canola oil. Vegetable oil. Beef fat (tallow). Lard. Beverages Juice. Sugar-sweetened soft drinks. Beer. Liquor and spirits. Sweets and desserts Cookies. Cakes. Pies. Candy. Seasonings and condiments Mayonnaise. Pre-made sauces and marinades. The items listed above may not be a complete list of foods and beverages you should limit. Contact a dietitian for more information. Summary The Mediterranean diet includes both food and lifestyle choices. Eat a variety of fresh fruits and vegetables, beans, nuts, seeds, and whole grains. Limit the amount of red meat and sweets that you eat. If recommended by your health  care provider, drink red wine in moderation. This means 1 glass a day for nonpregnant women and 2 glasses a day for men. A glass of wine equals 5 oz (150 mL). This information is not intended to replace advice given to you by your health care provider. Make sure you discuss any questions you have with your health care provider. Document Revised: 11/17/2019 Document Reviewed: 09/14/2019 Elsevier Patient Education  2023 [employer].

## 2023-03-02 ENCOUNTER — Telehealth: Payer: Self-pay

## 2023-03-02 NOTE — Telephone Encounter (Signed)
Multiple attempts were made by [facility] to schedule patient.   Patient did not return call and letter was sent from their office.   Referral closed. aw

## 2023-03-03 LAB — LAB REPORT - SCANNED: EGFR: 65

## 2023-04-26 ENCOUNTER — Encounter (HOSPITAL_COMMUNITY): Payer: Self-pay

## 2023-04-28 ENCOUNTER — Other Ambulatory Visit: Payer: Self-pay | Admitting: Cardiovascular Disease

## 2023-04-28 ENCOUNTER — Ambulatory Visit (HOSPITAL_COMMUNITY)
Admission: RE | Admit: 2023-04-28 | Discharge: 2023-04-28 | Disposition: A | Discharge status: Home / Self Care | Payer: 59 | Source: Ambulatory Visit | Attending: Cardiovascular Disease | Admitting: Cardiovascular Disease

## 2023-04-28 DIAGNOSIS — I214 Non-ST elevation (NSTEMI) myocardial infarction: Secondary | ICD-10-CM

## 2023-04-28 MED ORDER — GADOBUTROL 1 MMOL/ML IV SOLN
10.0000 mL | Freq: Once | INTRAVENOUS | Status: AC | PRN
  Administered 2023-04-28: 10 mL via INTRAVENOUS

## 2023-05-15 ENCOUNTER — Other Ambulatory Visit: Payer: Self-pay | Admitting: Medical

## 2023-05-16 ENCOUNTER — Encounter: Payer: Self-pay | Admitting: Cardiovascular Disease

## 2023-05-16 NOTE — Progress Notes (Unsigned)
Cardiology Office Note:    Date:  05/17/2023   ID:  Andrea, [name] 1971/06/16, MRN [id number]  PCP:  Ralene Ok, MD  Cardiologist:  Kristeen Miss, MD    Referring MD: Ralene Ok, MD   Chief Complaint  Patient presents with   Hyperlipidemia   myocarditis   Problem List   1. Essential hypertension History of chest pain Dizziness   Prior notes:    Andrea [name] is a 52 y.o. female with a hx of Hypertension who presents today for episodes of chest tightness and dizziness.  Andrea [name] is seen for the first time today  She has had some symptoms of diaphoresis on occasion.   Has lightheadedness - has been diagnosed with benign positional vertigo . Occurs randomly  - does not occur regularly . Last year she was exercising more and had lots of dizziness Had stopped smoking , lost 35 lbs But relapsed, restarted smoking in Feb.    Has some episodes of chest tightness.   Has been diagnosied with HTN  - tried [place] - HCT but she had hypotension.     Has   Has been on phentermine for about a year.   September 15, 2017:  Andrea [name] is seen back today for follow-up visit. She was seen several months ago with some chest pain.  A coronary calcium score is 0. Coronary CT angiogram was normal. She was also having some palpitations that were probably related to phentermine. Has had these palps for years.  Has worn a monitir . Still has fleeting pains.  Still has vertigo .  Has been to ENT.   Needs to go again  BP is elevated  Her primary MD has started her on a BP med but she has not picked it up Still smoking  Not exercising yet  Echocardiogram showed normal left ventricular systolic function with EF of 60-65%.  She has grade 1 diastolic dysfunction.  She has trivial aortic insufficiency.  February 05, 2021:  Andrea [name] is seen today for follow up of her CP. Coronary calcium score was 0 . Has some palpitations - which were likely due to phentermine.  Still smoking  - we  discussed at length   Has some intermittant gas pain   Has been exercising ( [employer])  Lost 20 lbs . But she still smokes.  Gets lightheaded toward the end of the boot camp.  She is noticed that her heart rate seems to be a little bit fast.  Even when she is out walking in the park with her husband her Fitbit recorded a heart rate of over 200.  HR will occasionaly spike up to 130s even while she is sitting  Seems to occur after eating    May 13, 2021:  Andrea [name] is seen today for follow up of her palpitations CCA = 0. Still smoking , perhaps 1/2 ppd   Wt is 229 lbs.   Has stopped exercising . Has a new job - still at [employer]   Her palpitations are better on the metoprolol  Sometimes she skips the am dose. She does not eat breakfast   Aug. 4, 2023 Andrea [name] is seen for follow up of her palpitations CAC = 0 Wt is 241 lbs ( up 12 lb)   Still smoking  , less than 1/2 ppd  Screening CT scan shows early COPD   Long discussion about smoking cessation .  We discussed nicotine inhalers.   Has HLD, is not on any  lipid meds  Will check lipids today   February 05, 2023 Andrea [name] is seen today for follow-up visit.  She has hyperlipidemia, history of cigarette smoking, mild COPD.  Her coronary calcium score is 0. Wt is 257 lbs   She was recently admitted to [facility] hospital with  chest pain and pressure.  She had a temperature of 100.7. Troponin was 396. Troponin trend was relatively flat and remained elevated 396, 347, 352 Heart catheterization ultimately showed normal coronary arteries.  Echocardiogram reveals normal LVEF of 60 to 65%.  She has moderate left ventricular hypertrophy.  There is grade 1 diastolic dysfunction.  RV size and function were normal.  CP occurred for days , did not let up  Had fevers / chills   Was negative for COVID, flu,   Symptoms may have been myocarditis  Will get a cardiac MRI   No COVID boosters  Had COVID several months  prior   No CP currently , Is generally feeling better  Has quit smoking   Is having rectal bleeding  Hemorrhoidal bleeding  She will need cardiac clearance  Will wait until after the cardiac MRI before clearing her   We discussed weight loss  Is having some palpitation She felt better on metoprolol than on the Dilt   Blood pressure remains mildly elevated.  Will start her on losartan 50 mg a day.   May 17, 2023  Andrea [name] is seen for follow up of her "NSTEMI" - possible myocarditis Cardiac MRI is c/w myocarditis  Previous cath in Dec. 2023 showed normal coronaries Wt is 260 lbs    She needs to have hemorrhoid surgery.  We will be stopping the Plavix today.  She may also hold her ASA for 5 days prior to the procedure if requested.    She has normal left ventricular systolic function.  She is at low risk for her upcoming hemorrhoid surgery.   She has questions about when she may return to exercising given her history of myocarditis.  I will send a note to the advanced heart failure team and get some information regarding this.  She may need to see them for consultation or they may be able to provide me with some information without actually seeing her.  Past Medical History:  Diagnosis Date   Abnormal Pap smear of cervix    2010-2014 abnormal pap every 3-6 months   dysplastic squamous cells of cervix    Anxiety    occ.- no meds   Blood in stool    Depression    no meds   Diverticulitis 08/2019   DUB (dysfunctional uterine bleeding)    Dyslipidemia    Dyspnea    Resolved - due to anemia - no current problems   Eczema    Endometriosis    Epigastric pain    Fatigue    Fibroid    GERD (gastroesophageal reflux disease)    Grade I diastolic dysfunction 06/25/2017   Noted on ECHO   Hemorrhoid    2nd and 3rd degree   History of adenomatous polyp of colon    History of palpitations    HSV infection    Hx of dysplastic nevus 07/17/2019   L upper back 4.0cm lateral to  spine, moderate   Hyperlipidemia    diet controlled - no meds   Hypertension    not currently taking medication   Infertility, female    Iron deficiency anemia    Last iron infusion 09/14/2018   Left  groin hernia    history of   Muscle cramping    with exertion   Myocardial infarction (HCC)    Mild per pt   Obesity    Palpitations    Recurrent bacterial infection    Vaginal   Restless leg    due to anemia   Smoker    SVD (spontaneous vaginal delivery)    x 2   Thoracic back pain    while trying to sleep   Tinnitus, left    hears heart beat, MRI normal   Vertigo 2018    Past Surgical History:  Procedure Laterality Date   ABLATION ON ENDOMETRIOSIS  1995   anemia     due to gyn blood losses from uterine fibroids.  received PRBCs and iron infusions.     CERVICAL BIOPSY  W/ LOOP ELECTRODE EXCISION     CERVICAL CONIZATION W/BX N/A 09/20/2018   Procedure: CONIZATION CERVIX WITH BIOPSY;  Surgeon: Patton Salles, MD;  [facility];  Service: Gynecology;  Laterality: N/A;   COLONOSCOPY  06/2018   Dr Ritta Slot.  9 mm sessile, hyperplastic sigmoid polyp.  diverticulosis.  int hemorrhoids.  2 cm cecal lipoma   COLPOSCOPY     x 3   CYSTOSCOPY N/A 11/14/2018   Procedure: CYSTOSCOPY;  Surgeon: Patton Salles, MD;  Location: [facility];  Service: Gynecology;  Laterality: N/A;   DILATION AND CURETTAGE OF UTERUS  1993   DILATION AND CURETTAGE OF UTERUS N/A 09/20/2018   Procedure: FRACTIONAL DILATATION AND CURETTAGE;  Surgeon: Patton Salles, MD;  Location: [facility];  Service: Gynecology;  Laterality: N/A;   GYNECOLOGIC CRYOSURGERY     HEMORRHOID BANDING     several   HERNIA REPAIR Left    childhood   INTRAUTERINE DEVICE (IUD) INSERTION  2013    (Mirena)   INTRAUTERINE DEVICE INSERTION  2000   [facility]   IUD REMOVAL  2010   IUD REMOVAL  2018   LEFT HEART CATH AND CORONARY ANGIOGRAPHY N/A 10/02/2022    Procedure: LEFT HEART CATH AND CORONARY ANGIOGRAPHY;  Surgeon: Iran Ouch, MD;  Location: [facility];  Service: Cardiovascular;  Laterality: N/A;   TOTAL LAPAROSCOPIC HYSTERECTOMY WITH SALPINGECTOMY Bilateral 11/14/2018   Procedure: TOTAL LAPAROSCOPIC HYSTERECTOMY WITH BILATERAL SALPINGECTOMY AND LYSIS OF ADHESIONS;  Surgeon: Patton Salles, MD;  Location: [facility];  Service: Gynecology;  Laterality: Bilateral;   TUBAL LIGATION     2010   UPPER GI ENDOSCOPY  06/2018   x 2   WISDOM TOOTH EXTRACTION      Current Medications: Current Meds  Medication Sig   acetaminophen (TYLENOL) 325 MG tablet Take 2 tablets (650 mg total) by mouth every 6 (six) hours as needed for mild pain (or temp > 100).   aspirin EC 81 MG tablet Take 1 tablet (81 mg total) by mouth daily. Swallow whole.   atorvastatin (LIPITOR) 40 MG tablet Take 1 tablet (40 mg total) by mouth every evening. (Patient taking differently: Take 40 mg by mouth in the morning.)   fenofibrate 160 MG tablet Take 1 tablet (160 mg total) by mouth daily.   hydrocortisone (ANUSOL-HC) 2.5 % rectal cream    losartan (COZAAR) 50 MG tablet Take 1 tablet (50 mg total) by mouth daily.   losartan (COZAAR) 50 MG tablet Take 1 tablet (50 mg total) by mouth daily.   metoprolol succinate (TOPROL-XL) 50 MG  24 hr tablet Take 1 tablet (50 mg total) by mouth daily. Take with or immediately following a meal.   metoprolol succinate (TOPROL-XL) 50 MG 24 hr tablet Take 1 tablet (50 mg total) by mouth daily. Take with or immediately following a meal.   pantoprazole (PROTONIX) 40 MG tablet Take 1 tablet (40 mg total) by mouth daily.   [DISCONTINUED] clopidogrel (PLAVIX) 75 MG tablet Take 1 tablet (75 mg total) by mouth daily with breakfast.     Allergies:   Patient has no known allergies.   Social History   Socioeconomic History   Marital status: Married    Spouse name: Not on file   Number of children: 2   Years of education: Not on  file   Highest education level: Not on file  Occupational History   Not on file  Tobacco Use   Smoking status: Former    Current packs/day: 0.00    Average packs/day: 0.8 packs/day for 35.0 years (26.3 ttl pk-yrs)    Types: Cigarettes    Start date: 06/26/1987    Quit date: 06/25/2022    Years since quitting: 0.8   Smokeless tobacco: Never  Vaping Use   Vaping status: Never Used  Substance and Sexual Activity   Alcohol use: Yes    Comment: 4-5 drinks a week (alcohol)  Red Bull and Vodka or red wine   Drug use: Never   Sexual activity: Yes    Partners: Male    Birth control/protection: Surgical    Comment: Hyst  Other Topics Concern   Not on file  Social History Narrative   Not on file   Social Determinants of Health   Financial Resource Strain: Not on file  Food Insecurity: No Food Insecurity (09/30/2022)   Hunger Vital Sign    Worried About Running Out of Food in the Last Year: Never true    Ran Out of Food in the Last Year: Never true  Transportation Needs: No Transportation Needs (09/30/2022)   PRAPARE - Administrator, Civil Service (Medical): No    Lack of Transportation (Non-Medical): No  Physical Activity: Not on file  Stress: Not on file  Social Connections: Not on file     Family History: The patient's family history includes Alzheimer's disease in her maternal grandfather; Cancer (age of onset: 70) in her mother; Heart disease in her father and paternal grandfather; Heart failure in her paternal aunt; Other in her brother, maternal grandfather, paternal grandfather, and paternal grandmother. ROS:   Please see the history of present illness.     All other systems reviewed and are negative.  EKGs/Labs/Other Studies Reviewed:    The following studies were reviewed today:   EKG:       Recent Labs: 09/30/2022: ALT 16 10/02/2022: BUN 12; Creatinine, Ser 0.68; Potassium 4.1; Sodium 139 10/09/2022: Hemoglobin 10.8; Platelets 332  Recent Lipid  Panel    Component Value Date/Time   CHOL 200 09/30/2022 1442   CHOL 245 (H) 05/29/2022 1544   TRIG 229 (H) 09/30/2022 1442   HDL 43 09/30/2022 1442   HDL 39 (L) 05/29/2022 1544   CHOLHDL 4.7 09/30/2022 1442   VLDL 46 (H) 09/30/2022 1442   LDLCALC 111 (H) 09/30/2022 1442   LDLCALC 103 (H) 05/29/2022 1544       Physical Exam: Blood pressure 130/89, pulse 78, height 5\' 7"  (1.702 m), weight 260 lb 6.4 oz (118.1 kg), SpO2 99%.       GEN:  Well nourished,  well developed in no acute distress HEENT: Normal NECK: No JVD; No carotid bruits LYMPHATICS: No lymphadenopathy CARDIAC: RRR , no murmurs, rubs, gallops RESPIRATORY:  Clear to auscultation without rales, wheezing or rhonchi  ABDOMEN: Soft, non-tender, non-distended MUSCULOSKELETAL:  No edema; No deformity  SKIN: Warm and dry NEUROLOGIC:  Alert and oriented x 3     ECG:   ASSESSMENT:    1. Subacute viral myocarditis       PLAN:       1.   Chest tightness:    Possible myocarditis: She had a normal coronary CT angiogram.  Coronary calcium score is 0.    The C MRI shows myocarditis   Will review with the heart failure team  to see if she needs to be seen in the CHF clinic    2.  Palpitations:       3.  Obesity :     4.  Hypertension:    Medication Adjustments/Labs and Tests Ordered: Current medicines are reviewed at length with the patient today.  Concerns regarding medicines are outlined above.  No orders of the defined types were placed in this encounter.   No orders of the defined types were placed in this encounter.      Signed, Kristeen Miss, MD  05/17/2023 2:09 PM    [facility]

## 2023-05-17 ENCOUNTER — Ambulatory Visit: Payer: 59 | Attending: Cardiovascular Disease | Admitting: Cardiovascular Disease

## 2023-05-17 ENCOUNTER — Encounter: Payer: Self-pay | Admitting: Cardiovascular Disease

## 2023-05-17 VITALS — BP 130/89 | HR 78 | Ht 67.0 in | Wt 260.4 lb

## 2023-05-17 DIAGNOSIS — I400 Infective myocarditis: Secondary | ICD-10-CM

## 2023-05-17 DIAGNOSIS — B3322 Viral myocarditis: Secondary | ICD-10-CM | POA: Insufficient documentation

## 2023-05-17 NOTE — Patient Instructions (Signed)
Medication Instructions:  Your physician has recommended you make the following change in your medication:   1) STOP clopidogrel (Plavix)  *If you need a refill on your cardiac medications before your next appointment, please call your pharmacy*  Lab Work: None ordered today.  Testing/Procedures: None ordered today.  Follow-Up: At [facility], you and your health needs are our priority.  As part of our continuing mission to provide you with exceptional heart care, we have created designated Provider Care Teams.  These Care Teams include your primary Cardiologist (physician) and Advanced Practice Providers (APPs -  Physician Assistants and Nurse Practitioners) who all work together to provide you with the care you need, when you need it.  Your next appointment:   6 month(s)  The format for your next appointment:   In Person  Provider:   Kristeen Miss, MD

## 2023-05-18 ENCOUNTER — Encounter: Payer: Self-pay | Admitting: Cardiovascular Disease

## 2023-07-23 ENCOUNTER — Other Ambulatory Visit: Payer: Self-pay | Admitting: Surgery

## 2023-07-26 LAB — SURGICAL PATHOLOGY

## 2023-08-16 NOTE — Progress Notes (Unsigned)
Cardiology Office Note:    Date:  08/17/2023   ID:  Andrea [name], Andrea [name] 1971/02/12, MRN [id number]  PCP:  Ralene Ok, MD  Cardiologist:  Kristeen Miss, MD    Referring MD: Ralene Ok, MD   No chief complaint on file.  Problem List   1. Essential hypertension History of chest pain Dizziness   Prior notes:    Andrea [name] is a 52 y.o. female with a hx of Hypertension who presents today for episodes of chest tightness and dizziness.  Andrea [name] is seen for the first time today  She has had some symptoms of diaphoresis on occasion.   Has lightheadedness - has been diagnosed with benign positional vertigo . Occurs randomly  - does not occur regularly . Last year she was exercising more and had lots of dizziness Had stopped smoking , lost 35 lbs But relapsed, restarted smoking in Feb.    Has some episodes of chest tightness.   Has been diagnosied with HTN  - tried [place] - HCT but she had hypotension.     Has   Has been on phentermine for about a year.   September 15, 2017:  Andrea [name] is seen back today for follow-up visit. She was seen several months ago with some chest pain.  A coronary calcium score is 0. Coronary CT angiogram was normal. She was also having some palpitations that were probably related to phentermine. Has had these palps for years.  Has worn a monitir . Still has fleeting pains.  Still has vertigo .  Has been to ENT.   Needs to go again  BP is elevated  Her primary MD has started her on a BP med but she has not picked it up Still smoking  Not exercising yet  Echocardiogram showed normal left ventricular systolic function with EF of 60-65%.  She has grade 1 diastolic dysfunction.  She has trivial aortic insufficiency.  February 05, 2021:  Andrea [name] is seen today for follow up of her CP. Coronary calcium score was 0 . Has some palpitations - which were likely due to phentermine.  Still smoking  - we discussed at length   Has some intermittant  gas pain   Has been exercising ( [employer])  Lost 20 lbs . But she still smokes.  Gets lightheaded toward the end of the boot camp.  She is noticed that her heart rate seems to be a little bit fast.  Even when she is out walking in the park with her husband her Fitbit recorded a heart rate of over 200.  HR will occasionaly spike up to 130s even while she is sitting  Seems to occur after eating    May 13, 2021:  Andrea [name] is seen today for follow up of her palpitations CCA = 0. Still smoking , perhaps 1/2 ppd   Wt is 229 lbs.   Has stopped exercising . Has a new job - still at [employer]   Her palpitations are better on the metoprolol  Sometimes she skips the am dose. She does not eat breakfast   Aug. 4, 2023 Andrea [name] is seen for follow up of her palpitations CAC = 0 Wt is 241 lbs ( up 12 lb)   Still smoking  , less than 1/2 ppd  Screening CT scan shows early COPD   Long discussion about smoking cessation .  We discussed nicotine inhalers.   Has HLD, is not on any lipid meds  Will check lipids today  February 05, 2023 Andrea [name] is seen today for follow-up visit.  She has hyperlipidemia, history of cigarette smoking, mild COPD.  Her coronary calcium score is 0. Wt is 257 lbs   She was recently admitted to [facility] hospital with  chest pain and pressure.  She had a temperature of 100.7. Troponin was 396. Troponin trend was relatively flat and remained elevated 396, 347, 352 Heart catheterization ultimately showed normal coronary arteries.  Echocardiogram reveals normal LVEF of 60 to 65%.  She has moderate left ventricular hypertrophy.  There is grade 1 diastolic dysfunction.  RV size and function were normal.  CP occurred for days , did not let up  Had fevers / chills   Was negative for COVID, flu,   Symptoms may have been myocarditis  Will get a cardiac MRI   No COVID boosters  Had COVID several months prior   No CP currently , Is generally feeling  better  Has quit smoking   Is having rectal bleeding  Hemorrhoidal bleeding  She will need cardiac clearance  Will wait until after the cardiac MRI before clearing her   We discussed weight loss  Is having some palpitation She felt better on metoprolol than on the Dilt   Blood pressure remains mildly elevated.  Will start her on losartan 50 mg a day.   May 17, 2023  Andrea [name] is seen for follow up of her "NSTEMI" - possible myocarditis Cardiac MRI is c/w myocarditis  Previous cath in Dec. 2023 showed normal coronaries Wt is 260 lbs    She needs to have hemorrhoid surgery.  We will be stopping the Plavix today.  She may also hold her ASA for 5 days prior to the procedure if requested.    She has normal left ventricular systolic function.  She is at low risk for her upcoming hemorrhoid surgery.   She has questions about when she may return to exercising given her history of myocarditis.  I will send a note to the advanced heart failure team and get some information regarding this.  She may need to see them for consultation or they may be able to provide me with some information without actually seeing her.  Oct. 22, 2024  Andrea [name] is seen for follow up of her myocarditis   She is not having any chest pain  She is back on her ASA .    Past Medical History:  Diagnosis Date   Abnormal Pap smear of cervix    2010-2014 abnormal pap every 3-6 months   dysplastic squamous cells of cervix    Anxiety    occ.- no meds   Blood in stool    Depression    no meds   Diverticulitis 08/2019   DUB (dysfunctional uterine bleeding)    Dyslipidemia    Dyspnea    Resolved - due to anemia - no current problems   Eczema    Endometriosis    Epigastric pain    Fatigue    Fibroid    GERD (gastroesophageal reflux disease)    Grade I diastolic dysfunction 06/25/2017   Noted on ECHO   Hemorrhoid    2nd and 3rd degree   History of adenomatous polyp of colon    History of palpitations    HSV  infection    Hx of dysplastic nevus 07/17/2019   L upper back 4.0cm lateral to spine, moderate   Hyperlipidemia    diet controlled - no meds   Hypertension  not currently taking medication   Infertility, female    Iron deficiency anemia    Last iron infusion 09/14/2018   Left groin hernia    history of   Muscle cramping    with exertion   Myocardial infarction (HCC)    Mild per pt   Obesity    Palpitations    Recurrent bacterial infection    Vaginal   Restless leg    due to anemia   Smoker    SVD (spontaneous vaginal delivery)    x 2   Thoracic back pain    while trying to sleep   Tinnitus, left    hears heart beat, MRI normal   Vertigo 2018    Past Surgical History:  Procedure Laterality Date   ABLATION ON ENDOMETRIOSIS  1995   anemia     due to gyn blood losses from uterine fibroids.  received PRBCs and iron infusions.     CERVICAL BIOPSY  W/ LOOP ELECTRODE EXCISION     CERVICAL CONIZATION W/BX N/A 09/20/2018   Procedure: CONIZATION CERVIX WITH BIOPSY;  Surgeon: Patton Salles, MD;  Location: [facility];  Service: Gynecology;  Laterality: N/A;   COLONOSCOPY  06/2018   Dr Ritta Slot.  9 mm sessile, hyperplastic sigmoid polyp.  diverticulosis.  int hemorrhoids.  2 cm cecal lipoma   COLPOSCOPY     x 3   CYSTOSCOPY N/A 11/14/2018   Procedure: CYSTOSCOPY;  Surgeon: Patton Salles, MD;  Location: [facility];  Service: Gynecology;  Laterality: N/A;   DILATION AND CURETTAGE OF UTERUS  1993   DILATION AND CURETTAGE OF UTERUS N/A 09/20/2018   Procedure: FRACTIONAL DILATATION AND CURETTAGE;  Surgeon: Patton Salles, MD;  Location: [facility];  Service: Gynecology;  Laterality: N/A;   GYNECOLOGIC CRYOSURGERY     HEMORRHOID BANDING     several   HERNIA REPAIR Left    childhood   INTRAUTERINE DEVICE (IUD) INSERTION  2013    (Mirena)   INTRAUTERINE DEVICE INSERTION  2000   [facility]   IUD REMOVAL  2010   IUD  REMOVAL  2018   LEFT HEART CATH AND CORONARY ANGIOGRAPHY N/A 10/02/2022   Procedure: LEFT HEART CATH AND CORONARY ANGIOGRAPHY;  Surgeon: Iran Ouch, MD;  Location: [facility];  Service: Cardiovascular;  Laterality: N/A;   TOTAL LAPAROSCOPIC HYSTERECTOMY WITH SALPINGECTOMY Bilateral 11/14/2018   Procedure: TOTAL LAPAROSCOPIC HYSTERECTOMY WITH BILATERAL SALPINGECTOMY AND LYSIS OF ADHESIONS;  Surgeon: Patton Salles, MD;  Location: [facility];  Service: Gynecology;  Laterality: Bilateral;   TUBAL LIGATION     2010   UPPER GI ENDOSCOPY  06/2018   x 2   WISDOM TOOTH EXTRACTION      Current Medications: Current Meds  Medication Sig   acetaminophen (TYLENOL) 325 MG tablet Take 2 tablets (650 mg total) by mouth every 6 (six) hours as needed for mild pain (or temp > 100).   aspirin EC 81 MG tablet Take 1 tablet (81 mg total) by mouth daily. Swallow whole.   atorvastatin (LIPITOR) 40 MG tablet TAKE 1 TABLET BY MOUTH IN THE  EVENING   fenofibrate 160 MG tablet Take 1 tablet (160 mg total) by mouth daily.   hydrocortisone (ANUSOL-HC) 2.5 % rectal cream    losartan (COZAAR) 50 MG tablet Take 1 tablet (50 mg total) by mouth daily.   metoprolol succinate (TOPROL-XL) 50 MG 24 hr tablet Take  1 tablet (50 mg total) by mouth daily. Take with or immediately following a meal.   metoprolol succinate (TOPROL-XL) 50 MG 24 hr tablet Take 1 tablet (50 mg total) by mouth daily. Take with or immediately following a meal.   oxyCODONE (OXY IR/ROXICODONE) 5 MG immediate release tablet Take 5 mg by mouth as needed.   pantoprazole (PROTONIX) 40 MG tablet Take 1 tablet (40 mg total) by mouth daily.   [DISCONTINUED] losartan (COZAAR) 50 MG tablet Take 1 tablet (50 mg total) by mouth daily.     Allergies:   Patient has no known allergies.   Social History   Socioeconomic History   Marital status: Married    Spouse name: Not on file   Number of children: 2   Years of education: Not on file    Highest education level: Not on file  Occupational History   Not on file  Tobacco Use   Smoking status: Former    Current packs/day: 0.00    Average packs/day: 0.8 packs/day for 35.0 years (26.3 ttl pk-yrs)    Types: Cigarettes    Start date: 06/26/1987    Quit date: 06/25/2022    Years since quitting: 1.1   Smokeless tobacco: Never  Vaping Use   Vaping status: Never Used  Substance and Sexual Activity   Alcohol use: Yes    Comment: 4-5 drinks a week (alcohol)  Red Bull and Vodka or red wine   Drug use: Never   Sexual activity: Yes    Partners: Male    Birth control/protection: Surgical    Comment: Hyst  Other Topics Concern   Not on file  Social History Narrative   Not on file   Social Determinants of Health   Financial Resource Strain: Not on file  Food Insecurity: No Food Insecurity (09/30/2022)   Hunger Vital Sign    Worried About Running Out of Food in the Last Year: Never true    Ran Out of Food in the Last Year: Never true  Transportation Needs: No Transportation Needs (09/30/2022)   PRAPARE - Administrator, Civil Service (Medical): No    Lack of Transportation (Non-Medical): No  Physical Activity: Not on file  Stress: Not on file  Social Connections: Not on file     Family History: The patient's family history includes Alzheimer's disease in her maternal grandfather; Cancer (age of onset: 45) in her mother; Heart disease in her father and paternal grandfather; Heart failure in her paternal aunt; Other in her brother, maternal grandfather, paternal grandfather, and paternal grandmother. ROS:   Please see the history of present illness.     All other systems reviewed and are negative.  EKGs/Labs/Other Studies Reviewed:    The following studies were reviewed today:   EKG:       Recent Labs: 09/30/2022: ALT 16 10/02/2022: BUN 12; Creatinine, Ser 0.68; Potassium 4.1; Sodium 139 10/09/2022: Hemoglobin 10.8; Platelets 332  Recent Lipid Panel     Component Value Date/Time   CHOL 200 09/30/2022 1442   CHOL 245 (H) 05/29/2022 1544   TRIG 229 (H) 09/30/2022 1442   HDL 43 09/30/2022 1442   HDL 39 (L) 05/29/2022 1544   CHOLHDL 4.7 09/30/2022 1442   VLDL 46 (H) 09/30/2022 1442   LDLCALC 111 (H) 09/30/2022 1442   LDLCALC 103 (H) 05/29/2022 1544       Physical Exam: Blood pressure 122/60, pulse 60, height 5\' 7"  (1.702 m), weight 261 lb 6.4 oz (118.6 kg), SpO2  96%.       GEN:  moderately obese female   in no acute distress HEENT: Normal NECK: No JVD; No carotid bruits LYMPHATICS: No lymphadenopathy CARDIAC: RRR , no murmurs, rubs, gallops RESPIRATORY:  Clear to auscultation without rales, wheezing or rhonchi  ABDOMEN: Soft, non-tender, non-distended MUSCULOSKELETAL:  No edema; No deformity  SKIN: Warm and dry NEUROLOGIC:  Alert and oriented x 3     ECG:   ASSESSMENT:    1. Subacute viral myocarditis        PLAN:       1.   Viral Myocarditis:  pt denies any further CP .   Has not yet restarted her exercise regimen following her hemorrhoid surgery.  I encouraged her to get back into exercising as soon as she is able.       2.  Palpitations:   Stable.  She has not had any significant palpitations recently.    3.  Obesity :   Encouraged to work on calorie reduction.  She has a prescription for [facility] but has not started it yet.  Asked her to be careful following her hemorrhoid surgery.  She will want a make sure that her incisions are all well-healed before starting [facility].  I have asked her to discuss this further with her surgeon.  4.  Hypertension: Blood pressure is well-controlled.  Continue losartan 50 mg a day.  Medication Adjustments/Labs and Tests Ordered: Current medicines are reviewed at length with the patient today.  Concerns regarding medicines are outlined above.  No orders of the defined types were placed in this encounter.   No orders of the defined types were placed in this  encounter.      Signed, Kristeen Miss, MD  08/17/2023 10:20 AM    [facility]

## 2023-08-17 ENCOUNTER — Encounter: Payer: Self-pay | Admitting: Cardiovascular Disease

## 2023-08-17 ENCOUNTER — Ambulatory Visit: Payer: 59 | Attending: Cardiovascular Disease | Admitting: Cardiovascular Disease

## 2023-08-17 VITALS — BP 122/60 | HR 60 | Ht 67.0 in | Wt 261.4 lb

## 2023-08-17 DIAGNOSIS — I400 Infective myocarditis: Secondary | ICD-10-CM

## 2023-08-17 NOTE — Patient Instructions (Signed)
Follow-Up: At [facility], you and your health needs are our priority.  As part of our continuing mission to provide you with exceptional heart care, we have created designated Provider Care Teams.  These Care Teams include your primary Cardiologist (physician) and Advanced Practice Providers (APPs -  Physician Assistants and Nurse Practitioners) who all work together to provide you with the care you need, when you need it.  Your next appointment:   1 year(s)  Provider:   Riley Lam, MD

## 2023-12-07 ENCOUNTER — Other Ambulatory Visit: Payer: Self-pay | Admitting: Medical

## 2023-12-19 ENCOUNTER — Other Ambulatory Visit: Payer: Self-pay | Admitting: Cardiovascular Disease

## 2023-12-22 ENCOUNTER — Ambulatory Visit: Payer: 59 | Admitting: Dermatology

## 2024-01-04 ENCOUNTER — Encounter: Payer: Self-pay | Admitting: Dermatology

## 2024-01-04 ENCOUNTER — Ambulatory Visit: Payer: 59 | Admitting: Dermatology

## 2024-01-04 DIAGNOSIS — L578 Other skin changes due to chronic exposure to nonionizing radiation: Secondary | ICD-10-CM | POA: Diagnosis not present

## 2024-01-04 DIAGNOSIS — L814 Other melanin hyperpigmentation: Secondary | ICD-10-CM

## 2024-01-04 DIAGNOSIS — L820 Inflamed seborrheic keratosis: Secondary | ICD-10-CM | POA: Diagnosis not present

## 2024-01-04 DIAGNOSIS — W908XXA Exposure to other nonionizing radiation, initial encounter: Secondary | ICD-10-CM

## 2024-01-04 DIAGNOSIS — D492 Neoplasm of unspecified behavior of bone, soft tissue, and skin: Secondary | ICD-10-CM | POA: Diagnosis not present

## 2024-01-04 DIAGNOSIS — D229 Melanocytic nevi, unspecified: Secondary | ICD-10-CM

## 2024-01-04 DIAGNOSIS — L988 Other specified disorders of the skin and subcutaneous tissue: Secondary | ICD-10-CM

## 2024-01-04 DIAGNOSIS — Z7189 Other specified counseling: Secondary | ICD-10-CM

## 2024-01-04 DIAGNOSIS — Z86018 Personal history of other benign neoplasm: Secondary | ICD-10-CM

## 2024-01-04 DIAGNOSIS — Z79899 Other long term (current) drug therapy: Secondary | ICD-10-CM

## 2024-01-04 DIAGNOSIS — L649 Androgenic alopecia, unspecified: Secondary | ICD-10-CM

## 2024-01-04 DIAGNOSIS — Z1283 Encounter for screening for malignant neoplasm of skin: Secondary | ICD-10-CM | POA: Diagnosis not present

## 2024-01-04 DIAGNOSIS — L2089 Other atopic dermatitis: Secondary | ICD-10-CM

## 2024-01-04 DIAGNOSIS — L821 Other seborrheic keratosis: Secondary | ICD-10-CM

## 2024-01-04 DIAGNOSIS — L409 Psoriasis, unspecified: Secondary | ICD-10-CM

## 2024-01-04 DIAGNOSIS — D1801 Hemangioma of skin and subcutaneous tissue: Secondary | ICD-10-CM

## 2024-01-04 DIAGNOSIS — L209 Atopic dermatitis, unspecified: Secondary | ICD-10-CM

## 2024-01-04 DIAGNOSIS — L650 Telogen effluvium: Secondary | ICD-10-CM

## 2024-01-04 MED ORDER — ZORYVE 0.3 % EX CREA: TOPICAL_CREAM | CUTANEOUS | 3 refills | Status: AC

## 2024-01-04 MED ORDER — TRETINOIN 0.025 % EX CREA: TOPICAL_CREAM | Freq: Every day | CUTANEOUS | 6 refills | Status: AC

## 2024-01-04 NOTE — Progress Notes (Signed)
 Follow-Up Visit   Subjective  Andrea [name] is a 53 y.o. female who presents for the following: Skin Cancer Screening and Full Body Skin Exam, hx of Dysplastic nevus. Patient c/o tag moles on her chest irritating getting hung on her bra and thinning hair.    The patient presents for Total-Body Skin Exam (TBSE) for skin cancer screening and mole check. The patient has spots, moles and lesions to be evaluated, some may be new or changing and the patient may have concern these could be cancer.  The following portions of the chart were reviewed this encounter and updated as appropriate: medications, allergies, medical history  Review of Systems:  No other skin or systemic complaints except as noted in HPI or Assessment and Plan.  Objective  Well appearing patient in no apparent distress; mood and affect are within normal limits.  A full examination was performed including scalp, head, eyes, ears, nose, lips, neck, chest, axillae, abdomen, back, buttocks, bilateral upper extremities, bilateral lower extremities, hands, feet, fingers, toes, fingernails, and toenails. All findings within normal limits unless otherwise noted below.   Relevant physical exam findings are noted in the Assessment and Plan.  left proximal mandible x 1 Stuck-on, waxy, tan-brown papule or plaque --Discussed benign etiology and prognosis.  left abdomen 0.6 cm pink papule            Assessment & Plan   SKIN CANCER SCREENING PERFORMED TODAY.  ACTINIC DAMAGE - Chronic condition, secondary to cumulative UV/sun exposure - diffuse scaly erythematous macules with underlying dyspigmentation - Recommend daily broad spectrum sunscreen SPF 30+ to sun-exposed areas, reapply every 2 hours as needed.  - Staying in the shade or wearing long sleeves, sun glasses (UVA+UVB protection) and wide brim hats (4-inch brim around the entire circumference of the hat) are also recommended for sun protection.  - Call for new  or changing lesions.  LENTIGINES, SEBORRHEIC KERATOSES, HEMANGIOMAS - Benign normal skin lesions - Benign-appearing - Call for any changes  MELANOCYTIC NEVI - Tan-brown and/or pink-flesh-colored symmetric macules and papules - Benign appearing on exam today - Observation - Call clinic for new or changing moles - Recommend daily use of broad spectrum spf 30+ sunscreen to sun-exposed areas.   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  In women androgenetic alopecia is commonly associated with menopause but may occur any time after puberty.  It causes hair thinning primarily on the crown with widening of the part and temporal hairline recession.  Can use OTC Rogaine (minoxidil) 5% solution/foam as directed.  Oral treatments in female patients who have no contraindication may include : - Low dose oral minoxidil 1.25 - 5mg  daily - Spironolactone 50 - 100mg  bid - Finasteride 2.5 - 5 mg daily Adjunctive therapies include: - Low Level Laser Light Therapy (LLLT) - Platelet-rich plasma injections (PRP) - Hair Transplants or scalp reduction   Treatment Plan: Due to history of Non-ST-Segment Elevation Myocardial Infarction vs Viral Myocarditis we will not consider oral Minoxidil, but in future if worsening hair loss issues, will consider  contacting her cardiologist  Dr  Kristeen Miss to see if this patient will be okay taking Minoxidil tablets    Start otc Biotin vitamins daily    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 Condition is symptomatic/ bothersome to patient. Not currently at goal.   Atopic dermatitis (eczema) is a chronic, relapsing, pruritic condition that can significantly affect quality of life. It is often associated with allergic rhinitis and/or asthma and can require treatment with topical medications, phototherapy, or in severe cases biologic injectable medication (Dupixent; Adbry) or Oral JAK inhibitors. Treatment Plan: Zoryve cream apply to affected skin once a day Recommend gentle skin care.   HISTORY OF DYSPLASTIC NEVUS Left upper back 4.0 cm lateral to spine-mod atypia  No evidence of recurrence today Recommend regular full body skin exams Recommend daily broad spectrum sunscreen SPF 30+ to sun-exposed areas, reapply every 2 hours as needed.  Call if any new or changing lesions are noted between office visits   FACIAL ELASTOSIS Exam: Rhytides and actinic changes Treatment Plan: Start Tretinoin 0.25% cream apply to face at bedtime   Recommend daily broad spectrum sunscreen SPF 30+ to sun-exposed areas, reapply every 2 hours as needed. Call for new or changing lesions.  Staying in the shade or wearing long sleeves, sun glasses (UVA+UVB protection) and wide brim hats (4-inch brim around the entire circumference of the hat) are also recommended for sun protection.     INFLAMED SEBORRHEIC KERATOSIS left proximal mandible x 1 Symptomatic, irritating, patient would like treated.   If not gone in 4-6 weeks return to the office  Destruction of lesion - left proximal mandible x 1 Complexity: simple   Destruction method: cryotherapy   Informed consent: discussed and consent obtained   Timeout:  patient name, date of birth, surgical site, and procedure verified Lesion destroyed using liquid nitrogen: Yes   Region frozen until ice ball extended beyond lesion: Yes   Outcome: patient tolerated procedure well with no complications   Post-procedure details: wound care instructions given   NEOPLASM OF SKIN left abdomen Epidermal / dermal shaving  Lesion  diameter (cm):  0.6 Informed consent: discussed and consent obtained   Timeout: patient name, date of birth, surgical site, and procedure verified   Procedure prep:  Patient was prepped and draped in usual sterile fashion Prep type:  Isopropyl alcohol Anesthesia: the lesion was anesthetized in a standard fashion   Anesthetic:  1% lidocaine w/ epinephrine 1-100,000 buffered w/ 8.4% NaHCO3 Hemostasis achieved with: pressure, aluminum chloride and electrodesiccation   Outcome: patient tolerated procedure well   Post-procedure details: sterile dressing applied and wound care instructions given   Dressing type: bandage and petrolatum   Specimen 1 - Surgical pathology Differential Diagnosis: R/O nevus vs dysplastic nevus vs Irritated skin tag   Check Margins: No   Return in about 5 months (around 06/05/2024) for Atopic dermatitis .  IAngelique Holm, CMA, am acting as scribe for Armida Sans, MD .   Documentation: I have reviewed the above documentation for accuracy and completeness, and I agree with the above.  Armida Sans, MD

## 2024-01-04 NOTE — Patient Instructions (Addendum)
 [facility] plastic surgery for cyst [id number]   [facility] [place], [place] - [address] Jacklynn Lewis [place] [address] Phone: [phone]  Fax: [phone]      Wound Care Instructions  Cleanse wound gently with soap and water once a day then pat dry with clean gauze. Apply a thin coat of Petrolatum (petroleum jelly, "Vaseline") over the wound (unless you have an allergy to this). We recommend that you use a new, sterile tube of Vaseline. Do not pick or remove scabs. Do not remove the yellow or white "healing tissue" from the base of the wound.  Cover the wound with fresh, clean, nonstick gauze and secure with paper tape. You may use Band-Aids in place of gauze and tape if the wound is small enough, but would recommend trimming much of the tape off as there is often too much. Sometimes Band-Aids can irritate the skin.  You should call the office for your biopsy report after 1 week if you have not already been contacted.  If you experience any problems, such as abnormal amounts of bleeding, swelling, significant bruising, significant pain, or evidence of infection, please call the office immediately.  FOR ADULT SURGERY PATIENTS: If you need something for pain relief you may take 1 extra strength Tylenol (acetaminophen) AND 2 Ibuprofen (200mg  each) together every 4 hours as needed for pain. (do not take these if you are allergic to them or if you have a reason you should not take them.) Typically, you may only need pain medication for 1 to 3 days.        Cryotherapy Aftercare  Wash gently with soap and water everyday.   Apply Vaseline and Band-Aid daily until healed.     Due to recent changes in healthcare laws, you may see results of your pathology and/or laboratory studies on MyChart before the doctors have had a chance to review them. We understand that in some cases there may be results that are confusing or concerning to you. Please  understand that not all results are received at the same time and often the doctors may need to interpret multiple results in order to provide you with the best plan of care or course of treatment. Therefore, we ask that you please give [place] 2 business days to thoroughly review all your results before contacting the office for clarification. Should we see a critical lab result, you will be contacted sooner.   If You Need Anything After Your Visit  If you have any questions or concerns for your doctor, please call our main line at [phone] and press option 4 to reach your doctor's medical assistant. If no one answers, please leave a voicemail as directed and we will return your call as soon as possible. Messages left after 4 pm will be answered the following business day.   You may also send [place] a message via MyChart. We typically respond to MyChart messages within 1-2 business days.  For prescription refills, please ask your pharmacy to contact our office. Our fax number is [phone].  If you have an urgent issue when the clinic is closed that cannot wait until the next business day, you can page your doctor at the number below.    Please note that while we do our best to be available for urgent issues outside of office hours, we are not available 24/7.   If you have an urgent issue and are unable to reach [place], you  may choose to seek medical care at your doctor's office, retail clinic, urgent care center, or emergency room.  If you have a medical emergency, please immediately call 911 or go to the emergency department.  Pager Numbers  - Dr. Gwen Pounds: [phone]  - Dr. Roseanne Reno: [phone]  - Dr. Katrinka Blazing: [phone]   In the event of inclement weather, please call our main line at [phone] for an update on the status of any delays or closures.  Dermatology Medication Tips: Please keep the boxes that topical medications come in in order to help keep track of the instructions  about where and how to use these. Pharmacies typically print the medication instructions only on the boxes and not directly on the medication tubes.   If your medication is too expensive, please contact our office at [phone] option 4 or send [place] a message through MyChart.   We are unable to tell what your co-pay for medications will be in advance as this is different depending on your insurance coverage. However, we may be able to find a substitute medication at lower cost or fill out paperwork to get insurance to cover a needed medication.   If a prior authorization is required to get your medication covered by your insurance company, please allow [place] 1-2 business days to complete this process.  Drug prices often vary depending on where the prescription is filled and some pharmacies may offer cheaper prices.  The website www.goodrx.com contains coupons for medications through different pharmacies. The prices here do not account for what the cost may be with help from insurance (it may be cheaper with your insurance), but the website can give you the price if you did not use any insurance.  - You can print the associated coupon and take it with your prescription to the pharmacy.  - You may also stop by our office during regular business hours and pick up a GoodRx coupon card.  - If you need your prescription sent electronically to a different pharmacy, notify our office through [facility] or by phone at [phone] option 4.     Si Usted Necesita Algo Despus de Su Visita  Tambin puede enviarnos un mensaje a travs de Clinical cytogeneticist. Por lo general respondemos a los mensajes de MyChart en el transcurso de 1 a 2 das hbiles.  Para renovar recetas, por favor pida a su farmacia que se ponga en contacto con nuestra oficina. Annie Sable de fax es [place] [phone].  Si tiene un asunto urgente cuando la clnica est cerrada y que no puede esperar hasta el siguiente da hbil, puede  llamar/localizar a su doctor(a) al nmero que aparece a continuacin.   Por favor, tenga en cuenta que aunque hacemos todo lo posible para estar disponibles para asuntos urgentes fuera del horario de [place], no estamos disponibles las 24 horas del da, los 7 [address]  [address] de la [place].   Si tiene un problema urgente y no puede comunicarse con nosotros, puede optar por buscar atencin mdica  en el consultorio de su doctor(a), en una clnica privada, en un centro de atencin urgente o en una sala de emergencias.  Si tiene Engineer, drilling, por favor llame inmediatamente al 911 o vaya a la sala de emergencias.  Nmeros de bper  - Dr. Gwen Pounds: [phone]  - Dra. Roseanne Reno: [phone]  - Dr. Katrinka Blazing: [phone]   En caso de inclemencias del tiempo, por favor llame a Lacy Duverney principal al [phone] para una actualizacin sobre el [place] de cualquier [place]  o cierre.  Consejos para la medicacin en dermatologa: Por favor, guarde las cajas en las que vienen los medicamentos de uso tpico para ayudarle a seguir las instrucciones sobre dnde y cmo usarlos. Las farmacias generalmente imprimen las instrucciones del medicamento slo en las cajas y no directamente en los tubos del [place].   Si su medicamento es muy caro, por favor, pngase en contacto con Rolm Gala llamando al [phone] y presione la opcin 4 o envenos un mensaje a travs de Clinical cytogeneticist.   No podemos decirle cul ser su copago por los medicamentos por adelantado ya que esto es diferente dependiendo de la cobertura de su seguro. Sin embargo, es posible que podamos encontrar un medicamento sustituto a Audiological scientist un formulario para que el seguro cubra el medicamento que se considera necesario.   Si se requiere una autorizacin previa para que su compaa de seguros [place] su medicamento, por favor permtanos de 1 a 2 das hbiles para completar [address]  Los precios de los medicamentos varan con  frecuencia dependiendo del Environmental consultant de dnde se surte la receta y alguna farmacias pueden ofrecer precios ms baratos.  El sitio web www.goodrx.com tiene cupones para medicamentos de Health and safety inspector. Los precios aqu no tienen en cuenta lo que podra costar con la ayuda del seguro (puede ser ms barato con su seguro), pero el sitio web puede darle el precio si no utiliz Tourist information centre manager.  - Puede imprimir el cupn correspondiente y llevarlo con su receta a la farmacia.  - Tambin puede pasar por nuestra oficina durante el horario de atencin regular y Education officer, museum una tarjeta de cupones de GoodRx.  - Si necesita que su receta se enve electrnicamente a una farmacia diferente, informe a nuestra oficina a travs de MyChart de [place] o por telfono llamando al [phone] y presione la opcin 4.

## 2024-01-06 LAB — SURGICAL PATHOLOGY

## 2024-01-07 ENCOUNTER — Encounter: Payer: Self-pay | Admitting: Dermatology

## 2024-01-10 ENCOUNTER — Telehealth: Payer: Self-pay

## 2024-01-10 NOTE — Telephone Encounter (Addendum)
 Called and discussed bx results with patient. She verbalized understanding and denied further questions. ----- Message from Armida Sans sent at 01/07/2024 11:03 AM EDT ----- FINAL DIAGNOSIS        1. Skin, left abdomen :       SEBORRHEIC KERATOSIS, POLYPOID, IRRITATED   Benign keratosis No further treatment needed

## 2024-01-27 ENCOUNTER — Other Ambulatory Visit: Payer: Self-pay | Admitting: Internal Medicine

## 2024-01-27 DIAGNOSIS — F17211 Nicotine dependence, cigarettes, in remission: Secondary | ICD-10-CM

## 2024-01-28 LAB — LAB REPORT - SCANNED: EGFR: 71

## 2024-01-31 ENCOUNTER — Other Ambulatory Visit: Payer: Self-pay | Admitting: Internal Medicine

## 2024-01-31 DIAGNOSIS — Z1231 Encounter for screening mammogram for malignant neoplasm of breast: Secondary | ICD-10-CM

## 2024-02-01 ENCOUNTER — Encounter: Payer: Self-pay | Admitting: Oncology

## 2024-02-01 ENCOUNTER — Ambulatory Visit
Admission: RE | Admit: 2024-02-01 | Discharge: 2024-02-01 | Disposition: A | Discharge status: Home / Self Care | Source: Ambulatory Visit | Attending: Internal Medicine | Admitting: Internal Medicine

## 2024-02-01 DIAGNOSIS — F17211 Nicotine dependence, cigarettes, in remission: Secondary | ICD-10-CM

## 2024-02-16 ENCOUNTER — Ambulatory Visit
Admission: RE | Admit: 2024-02-16 | Discharge: 2024-02-16 | Discharge status: Home / Self Care | Source: Ambulatory Visit | Attending: Internal Medicine | Admitting: Internal Medicine

## 2024-02-16 DIAGNOSIS — Z1231 Encounter for screening mammogram for malignant neoplasm of breast: Secondary | ICD-10-CM

## 2024-04-28 ENCOUNTER — Other Ambulatory Visit: Payer: Self-pay | Admitting: Medical

## 2024-05-05 ENCOUNTER — Other Ambulatory Visit: Payer: Self-pay | Admitting: Cardiovascular Disease

## 2024-06-06 ENCOUNTER — Ambulatory Visit: Admitting: Dermatology

## 2024-08-21 ENCOUNTER — Other Ambulatory Visit: Payer: Self-pay

## 2024-08-23 MED ORDER — FENOFIBRATE 160 MG PO TABS: 160.0000 mg | ORAL_TABLET | Freq: Every day | ORAL | 0 refills | Status: DC

## 2024-10-04 ENCOUNTER — Other Ambulatory Visit: Payer: Self-pay | Admitting: Physician Assistant

## 2024-11-23 ENCOUNTER — Other Ambulatory Visit: Payer: Self-pay

## 2024-11-28 MED ORDER — METOPROLOL SUCCINATE ER 50 MG PO TB24: 50.0000 mg | ORAL_TABLET | Freq: Every day | ORAL | 0 refills | Status: AC
